# Patient Record
Sex: Female | Born: 1950
Health system: Southern US, Community
[De-identification: ages and names within clinical notes are randomized; demographics above are authoritative.]

## PROBLEM LIST (undated history)

## (undated) DIAGNOSIS — I10 Essential (primary) hypertension: Secondary | ICD-10-CM

## (undated) DIAGNOSIS — G4700 Insomnia, unspecified: Secondary | ICD-10-CM

## (undated) DIAGNOSIS — T7840XA Allergy, unspecified, initial encounter: Secondary | ICD-10-CM

## (undated) DIAGNOSIS — G4730 Sleep apnea, unspecified: Secondary | ICD-10-CM

## (undated) DIAGNOSIS — E119 Type 2 diabetes mellitus without complications: Secondary | ICD-10-CM

## (undated) DIAGNOSIS — F32A Depression, unspecified: Secondary | ICD-10-CM

## (undated) DIAGNOSIS — Z803 Family history of malignant neoplasm of breast: Secondary | ICD-10-CM

## (undated) DIAGNOSIS — F329 Major depressive disorder, single episode, unspecified: Secondary | ICD-10-CM

## (undated) DIAGNOSIS — F419 Anxiety disorder, unspecified: Secondary | ICD-10-CM

## (undated) DIAGNOSIS — K219 Gastro-esophageal reflux disease without esophagitis: Secondary | ICD-10-CM

## (undated) DIAGNOSIS — M1990 Unspecified osteoarthritis, unspecified site: Secondary | ICD-10-CM

## (undated) DIAGNOSIS — C189 Malignant neoplasm of colon, unspecified: Secondary | ICD-10-CM

## (undated) DIAGNOSIS — K5900 Constipation, unspecified: Secondary | ICD-10-CM

## (undated) DIAGNOSIS — G709 Myoneural disorder, unspecified: Secondary | ICD-10-CM

## (undated) DIAGNOSIS — Z9221 Personal history of antineoplastic chemotherapy: Secondary | ICD-10-CM

## (undated) DIAGNOSIS — Z923 Personal history of irradiation: Secondary | ICD-10-CM

## (undated) DIAGNOSIS — D649 Anemia, unspecified: Secondary | ICD-10-CM

## (undated) DIAGNOSIS — N289 Disorder of kidney and ureter, unspecified: Secondary | ICD-10-CM

## (undated) DIAGNOSIS — G629 Polyneuropathy, unspecified: Secondary | ICD-10-CM

## (undated) HISTORY — DX: Malignant neoplasm of colon, unspecified: C18.9

## (undated) HISTORY — PX: UPPER GASTROINTESTINAL ENDOSCOPY: SHX188

## (undated) HISTORY — DX: Family history of malignant neoplasm of breast: Z80.3

## (undated) HISTORY — DX: Anxiety disorder, unspecified: F41.9

## (undated) HISTORY — PX: COLON SURGERY: SHX602

## (undated) HISTORY — DX: Allergy, unspecified, initial encounter: T78.40XA

## (undated) HISTORY — PX: APPENDECTOMY: SHX54

## (undated) HISTORY — DX: Personal history of irradiation: Z92.3

## (undated) HISTORY — DX: Unspecified osteoarthritis, unspecified site: M19.90

## (undated) HISTORY — DX: Major depressive disorder, single episode, unspecified: F32.9

## (undated) HISTORY — DX: Personal history of antineoplastic chemotherapy: Z92.21

## (undated) HISTORY — DX: Constipation, unspecified: K59.00

## (undated) HISTORY — DX: Depression, unspecified: F32.A

## (undated) HISTORY — DX: Myoneural disorder, unspecified: G70.9

## (undated) HISTORY — PX: COLONOSCOPY: SHX174

## (undated) HISTORY — PX: HERNIA REPAIR: SHX51

---
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
## 1999-11-16 ENCOUNTER — Emergency Department (HOSPITAL_COMMUNITY): Admission: EM | Admit: 1999-11-16 | Discharge: 1999-11-16 | Payer: Self-pay | Admitting: Emergency Medicine

## 2005-03-31 ENCOUNTER — Emergency Department (HOSPITAL_COMMUNITY): Admission: EM | Admit: 2005-03-31 | Discharge: 2005-03-31 | Payer: Self-pay | Admitting: Emergency Medicine

## 2005-04-20 ENCOUNTER — Ambulatory Visit: Payer: Self-pay | Admitting: Family Medicine

## 2005-04-21 ENCOUNTER — Ambulatory Visit: Payer: Self-pay | Admitting: *Deleted

## 2005-04-26 ENCOUNTER — Ambulatory Visit (HOSPITAL_COMMUNITY): Admission: RE | Admit: 2005-04-26 | Discharge: 2005-04-26 | Payer: Self-pay | Admitting: Family Medicine

## 2005-05-04 ENCOUNTER — Ambulatory Visit: Payer: Self-pay | Admitting: Family Medicine

## 2005-05-04 ENCOUNTER — Encounter (INDEPENDENT_AMBULATORY_CARE_PROVIDER_SITE_OTHER): Payer: Self-pay | Admitting: Specialist

## 2005-06-15 ENCOUNTER — Ambulatory Visit: Payer: Self-pay | Admitting: Family Medicine

## 2005-07-28 ENCOUNTER — Ambulatory Visit: Payer: Self-pay | Admitting: Family Medicine

## 2005-09-19 ENCOUNTER — Ambulatory Visit: Payer: Self-pay | Admitting: Family Medicine

## 2005-09-22 ENCOUNTER — Ambulatory Visit: Payer: Self-pay | Admitting: Family Medicine

## 2005-09-22 ENCOUNTER — Encounter (INDEPENDENT_AMBULATORY_CARE_PROVIDER_SITE_OTHER): Payer: Self-pay | Admitting: *Deleted

## 2006-01-09 ENCOUNTER — Ambulatory Visit: Payer: Self-pay | Admitting: Family Medicine

## 2006-03-01 ENCOUNTER — Encounter (INDEPENDENT_AMBULATORY_CARE_PROVIDER_SITE_OTHER): Payer: Self-pay | Admitting: Family Medicine

## 2006-03-01 ENCOUNTER — Ambulatory Visit: Payer: Self-pay | Admitting: Family Medicine

## 2006-03-01 ENCOUNTER — Encounter (INDEPENDENT_AMBULATORY_CARE_PROVIDER_SITE_OTHER): Payer: Self-pay | Admitting: *Deleted

## 2006-05-15 ENCOUNTER — Ambulatory Visit (HOSPITAL_COMMUNITY): Admission: RE | Admit: 2006-05-15 | Discharge: 2006-05-15 | Payer: Self-pay | Admitting: Family Medicine

## 2006-08-15 ENCOUNTER — Ambulatory Visit: Payer: Self-pay | Admitting: Internal Medicine

## 2006-10-18 ENCOUNTER — Encounter (INDEPENDENT_AMBULATORY_CARE_PROVIDER_SITE_OTHER): Payer: Self-pay | Admitting: *Deleted

## 2006-11-08 ENCOUNTER — Encounter (INDEPENDENT_AMBULATORY_CARE_PROVIDER_SITE_OTHER): Payer: Self-pay | Admitting: Family Medicine

## 2006-11-08 DIAGNOSIS — E669 Obesity, unspecified: Secondary | ICD-10-CM

## 2006-11-08 DIAGNOSIS — F1910 Other psychoactive substance abuse, uncomplicated: Secondary | ICD-10-CM | POA: Insufficient documentation

## 2006-11-08 DIAGNOSIS — E119 Type 2 diabetes mellitus without complications: Secondary | ICD-10-CM

## 2006-11-08 DIAGNOSIS — I10 Essential (primary) hypertension: Secondary | ICD-10-CM | POA: Insufficient documentation

## 2006-11-08 DIAGNOSIS — Z87891 Personal history of nicotine dependence: Secondary | ICD-10-CM

## 2006-11-08 DIAGNOSIS — D259 Leiomyoma of uterus, unspecified: Secondary | ICD-10-CM | POA: Insufficient documentation

## 2006-11-10 ENCOUNTER — Encounter (INDEPENDENT_AMBULATORY_CARE_PROVIDER_SITE_OTHER): Payer: Self-pay | Admitting: Family Medicine

## 2006-12-02 ENCOUNTER — Encounter (INDEPENDENT_AMBULATORY_CARE_PROVIDER_SITE_OTHER): Payer: Self-pay | Admitting: Family Medicine

## 2006-12-02 LAB — CONVERTED CEMR LAB

## 2008-07-23 ENCOUNTER — Ambulatory Visit: Payer: Self-pay | Admitting: Internal Medicine

## 2008-07-23 ENCOUNTER — Encounter (INDEPENDENT_AMBULATORY_CARE_PROVIDER_SITE_OTHER): Payer: Self-pay | Admitting: Family Medicine

## 2008-07-23 DIAGNOSIS — K5909 Other constipation: Secondary | ICD-10-CM

## 2008-07-23 DIAGNOSIS — K648 Other hemorrhoids: Secondary | ICD-10-CM | POA: Insufficient documentation

## 2008-07-23 LAB — CONVERTED CEMR LAB: Blood Glucose, Fingerstick: 119

## 2008-08-11 DIAGNOSIS — N259 Disorder resulting from impaired renal tubular function, unspecified: Secondary | ICD-10-CM | POA: Insufficient documentation

## 2008-08-11 LAB — CONVERTED CEMR LAB
ALT: 9 units/L (ref 0–35)
AST: 14 units/L (ref 0–37)
Albumin: 4.2 g/dL (ref 3.5–5.2)
Alkaline Phosphatase: 94 units/L (ref 39–117)
BUN: 22 mg/dL (ref 6–23)
Calcium: 9.7 mg/dL (ref 8.4–10.5)
Cholesterol: 168 mg/dL (ref 0–200)
LDL Cholesterol: 81 mg/dL (ref 0–99)
Potassium: 4.1 meq/L (ref 3.5–5.3)
Sodium: 145 meq/L (ref 135–145)
Total Bilirubin: 0.2 mg/dL — ABNORMAL LOW (ref 0.3–1.2)
Total CHOL/HDL Ratio: 3.2

## 2008-08-27 ENCOUNTER — Ambulatory Visit: Payer: Self-pay | Admitting: Internal Medicine

## 2008-09-05 ENCOUNTER — Inpatient Hospital Stay (HOSPITAL_COMMUNITY): Admission: EM | Admit: 2008-09-05 | Discharge: 2008-09-08 | Payer: Self-pay | Admitting: Emergency Medicine

## 2008-09-05 ENCOUNTER — Encounter (INDEPENDENT_AMBULATORY_CARE_PROVIDER_SITE_OTHER): Payer: Self-pay | Admitting: Surgery

## 2008-09-05 DIAGNOSIS — K449 Diaphragmatic hernia without obstruction or gangrene: Secondary | ICD-10-CM | POA: Insufficient documentation

## 2008-09-05 DIAGNOSIS — Z8719 Personal history of other diseases of the digestive system: Secondary | ICD-10-CM

## 2008-09-09 ENCOUNTER — Telehealth (INDEPENDENT_AMBULATORY_CARE_PROVIDER_SITE_OTHER): Payer: Self-pay | Admitting: Nurse Practitioner

## 2008-09-25 ENCOUNTER — Encounter (INDEPENDENT_AMBULATORY_CARE_PROVIDER_SITE_OTHER): Payer: Self-pay | Admitting: *Deleted

## 2008-10-20 ENCOUNTER — Encounter (INDEPENDENT_AMBULATORY_CARE_PROVIDER_SITE_OTHER): Payer: Self-pay | Admitting: Internal Medicine

## 2008-10-20 ENCOUNTER — Ambulatory Visit: Payer: Self-pay | Admitting: Physician Assistant

## 2008-11-06 LAB — CONVERTED CEMR LAB
Cholesterol: 145 mg/dL (ref 0–200)
VLDL: 15 mg/dL (ref 0–40)

## 2009-04-24 ENCOUNTER — Encounter: Payer: Self-pay | Admitting: Physician Assistant

## 2009-07-28 ENCOUNTER — Ambulatory Visit: Payer: Self-pay | Admitting: Physician Assistant

## 2009-07-28 LAB — CONVERTED CEMR LAB
Bilirubin Urine: NEGATIVE
Blood Glucose, Fingerstick: 131
Blood in Urine, dipstick: NEGATIVE
Glucose, Urine, Semiquant: NEGATIVE
Nitrite: NEGATIVE
Protein, U semiquant: NEGATIVE
Urobilinogen, UA: 0.2
WBC Urine, dipstick: NEGATIVE

## 2009-07-30 LAB — CONVERTED CEMR LAB
AST: 13 units/L (ref 0–37)
Albumin: 4.3 g/dL (ref 3.5–5.2)
Alkaline Phosphatase: 92 units/L (ref 39–117)
BUN: 16 mg/dL (ref 6–23)
Basophils Relative: 0 % (ref 0–1)
CO2: 26 meq/L (ref 19–32)
Chloride: 106 meq/L (ref 96–112)
Creatinine, Ser: 1.05 mg/dL (ref 0.40–1.20)
Eosinophils Relative: 2 % (ref 0–5)
HCT: 42.5 % (ref 36.0–46.0)
Lymphocytes Relative: 27 % (ref 12–46)
Lymphs Abs: 1.9 10*3/uL (ref 0.7–4.0)
MCHC: 30.8 g/dL (ref 30.0–36.0)
Neutro Abs: 4.5 10*3/uL (ref 1.7–7.7)
Platelets: 285 10*3/uL (ref 150–400)
RDW: 14.9 % (ref 11.5–15.5)
TSH: 1.058 microintl units/mL (ref 0.350–4.500)
Total Bilirubin: 0.4 mg/dL (ref 0.3–1.2)
Total Protein: 7.2 g/dL (ref 6.0–8.3)
WBC: 7.1 10*3/uL (ref 4.0–10.5)

## 2009-08-11 ENCOUNTER — Ambulatory Visit: Payer: Self-pay | Admitting: Physician Assistant

## 2009-08-30 ENCOUNTER — Telehealth (INDEPENDENT_AMBULATORY_CARE_PROVIDER_SITE_OTHER): Payer: Self-pay | Admitting: *Deleted

## 2009-08-30 DIAGNOSIS — H269 Unspecified cataract: Secondary | ICD-10-CM

## 2009-09-01 ENCOUNTER — Ambulatory Visit: Payer: Self-pay | Admitting: Physician Assistant

## 2009-09-14 ENCOUNTER — Telehealth: Payer: Self-pay | Admitting: Physician Assistant

## 2009-10-01 ENCOUNTER — Ambulatory Visit: Payer: Self-pay | Admitting: Physician Assistant

## 2009-10-01 LAB — CONVERTED CEMR LAB
CO2: 25 meq/L (ref 19–32)
Calcium: 9.2 mg/dL (ref 8.4–10.5)
Creatinine, Ser: 0.95 mg/dL (ref 0.40–1.20)

## 2009-10-27 ENCOUNTER — Encounter: Payer: Self-pay | Admitting: Physician Assistant

## 2009-11-05 ENCOUNTER — Ambulatory Visit: Payer: Self-pay | Admitting: Physician Assistant

## 2009-11-05 DIAGNOSIS — F329 Major depressive disorder, single episode, unspecified: Secondary | ICD-10-CM | POA: Insufficient documentation

## 2009-11-05 DIAGNOSIS — N760 Acute vaginitis: Secondary | ICD-10-CM | POA: Insufficient documentation

## 2009-11-05 LAB — CONVERTED CEMR LAB
Bilirubin Urine: NEGATIVE
Blood in Urine, dipstick: NEGATIVE
KOH Prep: NEGATIVE
Ketones, urine, test strip: NEGATIVE
Specific Gravity, Urine: 1.025
Urobilinogen, UA: 0.2
WBC Urine, dipstick: NEGATIVE
Whiff Test: POSITIVE
pH: 5.5

## 2009-11-06 LAB — CONVERTED CEMR LAB: Chlamydia, DNA Probe: NEGATIVE

## 2009-11-11 ENCOUNTER — Encounter: Payer: Self-pay | Admitting: Physician Assistant

## 2009-12-16 ENCOUNTER — Ambulatory Visit: Payer: Self-pay | Admitting: Nurse Practitioner

## 2009-12-16 LAB — CONVERTED CEMR LAB
BUN: 14 mg/dL (ref 6–23)
CO2: 29 meq/L (ref 19–32)
Potassium: 3.7 meq/L (ref 3.5–5.3)

## 2009-12-17 ENCOUNTER — Encounter (INDEPENDENT_AMBULATORY_CARE_PROVIDER_SITE_OTHER): Payer: Self-pay | Admitting: Nurse Practitioner

## 2010-01-06 ENCOUNTER — Ambulatory Visit: Payer: Self-pay | Admitting: Nurse Practitioner

## 2010-03-02 NOTE — Assessment & Plan Note (Signed)
Summary: CPP///MC   Vital Signs:  Patient profile:   60 year old female Height:      62 inches Weight:      193.8 pounds BMI:     35.57 Temp:     98.5 degrees F oral Pulse rate:   80 / minute Pulse rhythm:   regular BP sitting:   172 / 98  (left arm)  Vitals Entered By: CMA Student Kenyatta Jeffreis CC: CPP Is Patient Diabetic? Yes Pain Assessment Patient in pain? no      CBG Result 101  Does patient need assistance? Functional Status Self care Ambulation Normal   Primary Care Provider:  Tereso Newcomer, PA-C  CC:  CPP.  History of Present Illness: Here for CPP. PHQ9=10.  Feels depressed.  No SI.  No h/o hosp for depression.  Took valium a long time ago.  Feels sad mainly.  No anxiety.  Open to seeing a counselor. Postmenopausal. No vaginal bleeding. Has slight discharge at times.  No odor or itching. Sexually active . . 2 separate partners. Up to date on mammo. + FHx of CA - mother Not taking calcium.  Problems Prior to Update: 1)  Vaginitis, Bacterial  (ICD-616.10) 2)  Depression  (ICD-311) 3)  Preventive Health Care  (ICD-V70.0) 4)  Cataracts  (ICD-366.9) 5)  Family History Diabetes 1st Degree Relative  (ICD-V18.0) 6)  Family History Breast Cancer 1st Degree Relative <50  (ICD-V16.3) 7)  Hiatal Hernia  (ICD-553.3) 8)  Appendicitis, Hx of  (ICD-V12.79) 9)  Renal Insufficiency  (ICD-588.9) 10)  Constipation, Chronic  (ICD-564.09) 11)  Internal Hemorrhoids Without Mention Comp  (ICD-455.0) 12)  Hx, Personal, Tobacco Use  (ICD-V15.82) 13)  Fibroids, Uterus  (ICD-218.9) 14)  Diabetes Mellitus, Type II  (ICD-250.00) 15)  Obesity  (ICD-278.00) 16)  Substance Abuse, Multiple  (ICD-305.90) 17)  Hypertension  (ICD-401.9)  Current Medications (verified): 1)  Norvasc 5 Mg Tabs (Amlodipine Besylate) .... Take 1 Tablet By Mouth Once A Day For Blood Pressure 2)  Lisinopril 10 Mg Tabs (Lisinopril) .... Take 1 Tablet By Mouth Once A Day For Blood Pressure  Allergies  (verified): No Known Drug Allergies  Past History:  Past Medical History: Last updated: 09/01/2009 Current Problems:  CATARACTS (ICD-366.9) FAMILY HISTORY DIABETES 1ST DEGREE RELATIVE (ICD-V18.0) FAMILY HISTORY BREAST CANCER 1ST DEGREE RELATIVE <50 (ICD-V16.3) HIATAL HERNIA (ICD-553.3) APPENDICITIS, HX OF (ICD-V12.79) RENAL INSUFFICIENCY (ICD-588.9) CONSTIPATION, CHRONIC (ICD-564.09) INTERNAL HEMORRHOIDS WITHOUT MENTION COMP (ICD-455.0) HX, PERSONAL, TOBACCO USE (ICD-V15.82) FIBROIDS, UTERUS (ICD-218.9) DIABETES MELLITUS, TYPE II (ICD-250.00) OBESITY (ICD-278.00) SUBSTANCE ABUSE, MULTIPLE (ICD-305.90) HYPERTENSION (ICD-401.9)  Past Surgical History: Last updated: 07/28/2009 laparoscopic appendectomy 09/05/2008 Tubal ligation  Family History: Reviewed history from 07/28/2009 and no changes required. Family History Breast cancer 1st degree relative <50 -  mom Family History Diabetes 1st degree relative Family History Hypertension no colon cancer  Social History: Reviewed history from 07/28/2009 and no changes required. 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 Eyes:  Denies blurring. CV:  Denies chest pain or discomfort and shortness of breath with exertion. Resp:  Denies cough. GI:  Denies bloody stools and dark tarry stools. GU:  Denies dysuria and hematuria. Neuro:  Denies headaches. Psych:  Denies suicidal thoughts/plans. Endo:  Denies cold intolerance and heat intolerance. Heme:  Denies abnormal bruising.  Physical Exam  General:  alert, well-developed, and well-nourished.   Head:  normocephalic and atraumatic.   Eyes:  pupils equal, pupils round, and pupils reactive to light.  no cataracts  observed by me Ears:  R ear normal and L ear normal.   Nose:  no external deformity.   Mouth:  pharynx pink and moist, no erythema, and no exudates.   Neck:  supple, no thyromegaly, no JVD, no carotid bruits, and no cervical lymphadenopathy.   Chest Wall:  no deformities.   Breasts:  skin/areolae normal, no masses, no abnormal thickening, no nipple discharge, no tenderness, and no adenopathy.   Lungs:  normal breath sounds, no crackles, and no wheezes.   Heart:  normal rate, regular rhythm, and no murmur.   Abdomen:  soft, non-tender, normal bowel sounds, and no hepatomegaly.   Rectal:  normal sphincter tone, no masses, and external hemorrhoid(s).   Genitalia:  whitish vaginal d/cnormal introitus, no external lesions, mucosa pink and moist, no vaginal or cervical lesions, no vaginal atrophy, no friaility or hemorrhage, and no adnexal masses or tenderness.  unable to palpate fundus due to body habitus Msk:  normal ROM.   Pulses:  R posterior tibial normal, R dorsalis pedis normal, L posterior tibial normal, and L dorsalis pedis normal.   Extremities:  no edema  Neurologic:  alert & oriented X3 and cranial nerves II-XII intact.   Skin:  turgor normal.   Psych:  normally interactive and good eye contact.     Impression & Recommendations:  Problem # 1:  PREVENTIVE HEALTH CARE (ICD-V70.0)  Orders: T-Pap Smear, Thin Prep (37106) T-Hemoccult Cards-Multiple (26948) Gastroenterology Referral (GI) UA Dipstick w/o Micro (manual) (54627) T-HIV Antibody  (Reflex) [id number]) T- GC Chlamydia (29937)  Problem # 2:  DIABETES MELLITUS, TYPE II (ICD-250.00)  The following medications were removed from the medication list:    Lisinopril 10 Mg Tabs (Lisinopril) .Marland Kitchen... Take 1 tablet by mouth once a day for blood pressure Her updated medication list for this problem includes:    Lisinopril 20 Mg Tabs (Lisinopril) .Marland Kitchen... Take 1 tablet by mouth once a day for blood pressure (dose  changed)  Orders: Capillary Blood Glucose/CBG (16967)  Problem # 3:  HYPERTENSION (ICD-401.9) increase lisinopril to 20 mg bp ck and bmet in 2 weeks  The following medications were removed from the medication list:    Lisinopril 10 Mg Tabs (Lisinopril) .Marland Kitchen... Take 1 tablet by mouth once a day for blood pressure Her updated medication list for this problem includes:    Norvasc 5 Mg Tabs (Amlodipine besylate) .Marland Kitchen... Take 1 tablet by mouth once a day for blood pressure    Lisinopril 20 Mg Tabs (Lisinopril) .Marland Kitchen... Take 1 tablet by mouth once a day for blood pressure (dose changed)  Orders: UA Dipstick w/o Micro (manual) (89381)  Problem # 4:  CATARACTS (ICD-366.9) saw opthalmology and was told everything is ok  Problem # 5:  DEPRESSION (ICD-311) start celexa f/u in one month refer to Marchelle Folks  Orders: Psychology Referral (Psychology)  Her updated medication list for this problem includes:    Citalopram Hydrobromide 20 Mg Tabs (Citalopram hydrobromide) .Marland Kitchen... Take 1 tablet by mouth once a day  Problem # 6:  VAGINITIS, BACTERIAL (ICD-616.10)  Her updated medication list for this problem includes:    Flagyl 500 Mg Tabs (Metronidazole) .Marland Kitchen... Take 1 tablet by mouth two times a day  Complete Medication List: 1)  Norvasc 5 Mg Tabs (Amlodipine besylate) .... Take 1 tablet by mouth once a day for blood pressure 2)  Citalopram Hydrobromide 20 Mg Tabs (Citalopram hydrobromide) .... Take 1 tablet by mouth once a day 3)  Lisinopril 20 Mg Tabs (Lisinopril) .... Take 1 tablet by mouth once a day for blood pressure (dose changed) 4)  Flagyl 500 Mg Tabs (Metronidazole) .... Take 1 tablet by mouth two times a day  Patient Instructions: 1)  Schedule your mammogram in March 2012. 2)  Schedule appt with Ethelene Browns.  3)  Increase Lisinopril to 20 mg once daily.  You can take 2 of the 10 mg tablets. 4)  A new prescription was sent to the [facility]. pharmacy. 5)  Return to the lab in 2 weeks for BP  check and BMET.  Dx 401.1.  Notify provider if BP > 140/90 or < 110/60. 6)  Schedule follow up with provider in 4 weeks for depression. 7)  I have sent a prescription to the [facility]. pharmacy for: 8)  Celexa (Citalopram) - take once daily. 9)  Flagyl (Metronidazole) take two times a day until all gone. Prescriptions: FLAGYL 500 MG TABS (METRONIDAZOLE) Take 1 tablet by mouth two times a day  #14 x 0   Entered and Authorized by:   Tereso Newcomer PA-C   Signed by:   Tereso Newcomer PA-C on 11/05/2009   Method used:   Faxed to ...       [facility] - Pharmac (retail)       [address] [place], [place]  [address]       Ph: [phone]       Fax: [phone]   RxID:   [id number] LISINOPRIL 20 MG TABS (LISINOPRIL) Take 1 tablet by mouth once a day for blood pressure (dose changed)  #30 x 5   Entered and Authorized by:   Tereso Newcomer PA-C   Signed by:   Tereso Newcomer PA-C on 11/05/2009   Method used:   Faxed to ...       [facility] - Pharmac (retail)       [address] [place], [place]  [address]       Ph: [phone]       Fax: [phone]   RxID:   [id number] CITALOPRAM HYDROBROMIDE 20 MG TABS (CITALOPRAM HYDROBROMIDE) Take 1 tablet by mouth once a day  #30 x 2   Entered and Authorized by:   Tereso Newcomer PA-C   Signed by:   Tereso Newcomer PA-C on 11/05/2009   Method used:   Faxed to ...       [facility] - Pharmac (retail)       [address] [place], [place]  [address]       Ph: [phone]       Fax: [phone]   RxID:   [id number]   Laboratory Results   Urine Tests    Routine Urinalysis   Color: yellow Glucose: negative   (Normal Range: Negative) Bilirubin: negative   (Normal Range: Negative) Ketone: negative   (Normal Range: Negative) Spec. Gravity: 1.025   (Normal Range: 1.003-1.035) Blood: negative   (Normal Range: Negative) pH: 5.5    (Normal Range: 5.0-8.0) Protein: trace   (Normal Range: Negative) Urobilinogen: 0.2   (Normal Range: 0-1) Nitrite: negative   (Normal  Range: Negative) Leukocyte Esterace: negative   (Normal Range: Negative)     Blood Tests     CBG Random:: 101mg /dL    [place] Source: vaginal WBC/hpf: 1-5 Bacteria/hpf: rare Clue cells/hpf: many  Positive whiff Yeast/hpf: none [place] KOH: Negative Trichomonas/hpf: none

## 2010-03-02 NOTE — Progress Notes (Signed)
Summary: opthamology referral   Phone Note Outgoing Call   Summary of Call: Refer to ophthalmology.  Retasure abnormal.  Looks like she has cataracts. Send to [facility] after patient notified.  Initial call taken by: Brynda Rim,  August 30, 2009 2:13 PM  Follow-up for Phone Call        pt is aware.... [facility] it is okay for you to send out the referral Follow-up by: Armenia Shannon,  August 31, 2009 11:02 AM  Additional Follow-up for Phone Call Additional follow up Details #1::        PT HAVE AN APPT 10-24-09 @ 10AM PT AWARE OF THE APPT . Additional Follow-up by: Cheryll Dessert,  September 10, 2009 10:55 AM  New Problems: CATARACTS (ICD-366.9)   New Problems: CATARACTS (ICD-366.9)    Impression & Recommendations:  Problem # 1:  CATARACTS (ICD-366.9)  refer to ophthalmology  Orders: Ophthalmology Referral (Ophthalmology)  Complete Medication List: 1)  Norvasc 5 Mg Tabs (Amlodipine besylate) .... Take 1 tablet by mouth once a day for blood pressure

## 2010-03-02 NOTE — Progress Notes (Signed)
Summary: OPHTHALMOLOGY  Phone Note Outgoing Call   Summary of Call: I  CANCELED HER APT FOR THE OPHTHALMOLOGY BECAUSE HE DON' T SPECIALIST IN DIABETIC EXAM  WE ARE LOOKING FOR ANOTHER RESOURCES . PT IS AWARE OF THE CANCELATION . Initial call taken by: Cheryll Dessert,  September 14, 2009 11:21 AM  Follow-up for Phone Call        ok. Tereso Newcomer PA-C  September 20, 2009 8:55 PM   Additional Follow-up for Phone Call Additional follow up Details #1::        PT HAVE AN APPT 10-24-09 @ [facility]  PH# [phone] ADDRESS [phone] PT AWARE OF THE APPT AND COPAY $60  Additional Follow-up by: Cheryll Dessert,  September 21, 2009 8:33 AM

## 2010-03-02 NOTE — Letter (Signed)
Summary: *HSN Results Follow up  [facility]  [address] [place], [place] [address]   Phone: [phone]  Fax: [phone]      12/17/2009   DEBRALEE BRAAKSMA [address] [place], [place]  [address]   Dear  Ms. Madisan Weisel,                            ____S.Drinkard,FNP   ____D. Gore,FNP       ____B. McPherson,MD   ____V. Rankins,MD    ____E. Mulberry,MD    __X__N. Daphine Deutscher, FNP  ____D. Reche Dixon, MD    ____K. Philipp Deputy, MD    ____Other     This letter is to inform you that your recent test(s):  _______Pap Smear    ___X____Lab Test     _______X-ray    ___X____ is within acceptable limits  _______ requires a medication change  _______ requires a follow-up lab visit  _______ requires a follow-up visit with your Bunny Lowdermilk   Comments:  Labs done during recent office visit were normal.       _________________________________________________________ If you have any questions, please contact our office [phone].                     Sincerely,    Lehman Prom FNP [facility]

## 2010-03-02 NOTE — Assessment & Plan Note (Signed)
Summary: FU VISIT WITH SCOTT IN 3-4 MONTHS FOR BP / COME FASTING//GK   Vital Signs:  Patient profile:   60 year old female Height:      62 inches Weight:      190 pounds BMI:     34.88 Temp:     97.9 degrees F oral Pulse rate:   65 / minute Pulse rhythm:   regular Resp:     18 per minute BP sitting:   173 / 84  (left arm) Cuff size:   large  Vitals Entered By: Armenia Shannon (September 01, 2009 10:18 AM) CC: f/u... , Hypertension Management Is Patient Diabetic? No Pain Assessment Patient in pain? no       Does patient need assistance? Functional Status Self care Ambulation Normal   Primary Care Provider:  Tereso Newcomer, PA-C  CC:  f/u...  and Hypertension Management.  History of Present Illness: Here for f/u on HTN.  Taking meds but no meds today.   Hypertension History:      She denies headache, chest pain, dyspnea with exertion, peripheral edema, and syncope.  She notes no problems with any antihypertensive medication side effects.  Further comments include: no meds today .        Positive major cardiovascular risk factors include female age 44 years old or older, diabetes, hypertension, and current tobacco user.     Current Medications (verified): 1)  Norvasc 5 Mg Tabs (Amlodipine Besylate) .... Take 1 Tablet By Mouth Once A Day For Blood Pressure  Allergies (verified): No Known Drug Allergies  Past History:  Past Medical History: Current Problems:  CATARACTS (ICD-366.9) FAMILY HISTORY DIABETES 1ST DEGREE RELATIVE (ICD-V18.0) FAMILY HISTORY BREAST CANCER 1ST DEGREE RELATIVE <50 (ICD-V16.3) HIATAL HERNIA (ICD-553.3) APPENDICITIS, HX OF (ICD-V12.79) RENAL INSUFFICIENCY (ICD-588.9) CONSTIPATION, CHRONIC (ICD-564.09) INTERNAL HEMORRHOIDS WITHOUT MENTION COMP (ICD-455.0) HX, PERSONAL, TOBACCO USE (ICD-V15.82) FIBROIDS, UTERUS (ICD-218.9) DIABETES MELLITUS, TYPE II (ICD-250.00) OBESITY (ICD-278.00) SUBSTANCE ABUSE, MULTIPLE (ICD-305.90) HYPERTENSION  (ICD-401.9)  Past Surgical History: Reviewed history from 07/28/2009 and no changes required. laparoscopic appendectomy 09/05/2008 Tubal ligation  Review of Systems General:  + hot flashes at night .  Physical Exam  General:  alert, well-developed, and well-nourished.   Head:  normocephalic and atraumatic.   Eyes:  pupils equal, pupils round, and pupils reactive to light.  no cataracts observed by me Neck:  supple.   Lungs:  normal breath sounds.   Heart:  normal rate and regular rhythm.   Abdomen:  soft and non-tender.   Neurologic:  alert & oriented X3 and cranial nerves II-XII intact.   Psych:  normally interactive.    Diabetes Management Exam:    Foot Exam (with socks and/or shoes not present):       Sensory-Monofilament:          Left foot: normal          Right foot: normal   Impression & Recommendations:  Problem # 1:  HYPERTENSION (ICD-401.9) Assessment Improved  eventhough she has not taken meds today, think she needs Lisinopril added back start Lisinopril 10 mg once daily repeat bmet and BP check in 2 weeks  Her updated medication list for this problem includes:    Norvasc 5 Mg Tabs (Amlodipine besylate) .Marland Kitchen... Take 1 tablet by mouth once a day for blood pressure    Lisinopril 10 Mg Tabs (Lisinopril) .Marland Kitchen... Take 1 tablet by mouth once a day for blood pressure  Orders: EKG w/ Interpretation (93000)  Problem # 2:  CATARACTS (  ICD-366.9) ophthalmo appt pending  Problem # 3:  DIABETES MELLITUS, TYPE II (ICD-250.00)  A1C optimal at last check without meds repeat at CPP  Her updated medication list for this problem includes:    Lisinopril 10 Mg Tabs (Lisinopril) .Marland Kitchen... Take 1 tablet by mouth once a day for blood pressure  Orders: Capillary Blood Glucose/CBG ([address])  Problem # 4:  Preventive Health Care (ICD-V70.0) mammo results requested CPP scheduled in 10/2009 can discuss +/- clonidine for hot flashes at CPP  Complete Medication List: 1)  Norvasc 5 Mg  Tabs (Amlodipine besylate) .... Take 1 tablet by mouth once a day for blood pressure 2)  Lisinopril 10 Mg Tabs (Lisinopril) .... Take 1 tablet by mouth once a day for blood pressure  Hypertension Assessment/Plan:      The patient's hypertensive risk group is category C: Target organ damage and/or diabetes.  Her calculated 10 year risk of coronary heart disease is 27 %.  Today's blood pressure is 173/84.  Her blood pressure goal is < 130/80.  Patient Instructions: 1)  Cancel lab appointment in October. 2)  Start taking Lisinopril once daily for blood pressure along with your norvasc. 3)  Follow up with the nurse in 2 weeks for BMET and BP check.  Make sure you take your medicines that day. (Dx 401.1) 4)  Call me is you do not hear anything about an eye doctor appointment in the next 2-3 weeks. 5)  Follow up with me in September for your CPP.  Come fasting so we can check your cholesterol. Prescriptions: LISINOPRIL 10 MG TABS (LISINOPRIL) Take 1 tablet by mouth once a day for blood pressure  #30 x 5   Entered and Authorized by:   Tereso Newcomer PA-C   Signed by:   Tereso Newcomer PA-C on 09/01/2009   Method used:   Print then Give to Patient   RxID:   [id number]   Last LDL:                                                 75 (10/20/2008 9:16:00 PM)        Diabetic Foot Exam Last Podiatry Exam Date: 09/01/2009  Foot Inspection Is there a history of a foot ulcer?              No Is there a foot ulcer now?              No Can the patient see the bottom of their feet?          Yes Are the shoes appropriate in style and fit?          Yes Is there swelling or an abnormal foot shape?          No Are the toenails long?                No Are the toenails thick?                No Are the toenails ingrown?              No Is there heavy callous build-up?              No Is there a claw toe deformity?  No Is there elevated skin temperature?            No Is there  limited ankle dorsiflexion?            No Is there foot or ankle muscle weakness?            No Do you have pain in calf while walking?           No      Diabetic Foot Care Education :Patient educated on appropriate care of diabetic feet.     10-g (5.07) Semmes-Weinstein Monofilament Test Performed by: Armenia Shannon          Right Foot          Left Foot Visual Inspection               Test Control      normal         normal Site 1         normal         normal Site 2         normal         normal Site 3         normal         normal Site 4         normal         normal Site 5         normal         normal Site 6         normal         normal Site 7         normal         normal Site 8         normal         normal Site 9         normal         normal Site 10         normal         normal  Impression      normal         normal   EKG  Procedure date:  09/01/2009  Findings:      Normal sinus rhythm with rate of:  64 leftward axis IVCD nonspecific ST TW changes LVH   Mammogram  Procedure date:  04/24/2009  Findings:      No specific mammographic evidence of malignancy.  Assessment: BIRADS 1. Location: [facility].    Comments:      Screening mammogram in 1 year.

## 2010-03-02 NOTE — Letter (Signed)
Summary: *HSN Results Follow up  [facility]  [address] [place], [place] [address]   Phone: [phone]  Fax: [phone]      10/27/2009   Gail Carlson [address] [place], [place]  [address]   Dear  Ms. [name],                            ____S.Drinkard,FNP   ____D. Gore,FNP       ____B. McPherson,MD   ____V. Rankins,MD    ____E. Mulberry,MD    ____N. Daphine Deutscher, FNP  ____D. Reche Dixon, MD    ____K. Philipp Deputy, MD    __x__S. Alben Spittle, PA-C    This letter is to inform you that your recent test(s):  _______Pap Smear    ___x____Lab Test     _______X-ray    ___x____ is within acceptable limits  _______ requires a medication change  _______ requires a follow-up lab visit  _______ requires a follow-up visit with your provider   Comments:       _________________________________________________________ If you have any questions, please contact our office                     Sincerely,  Tereso Newcomer PA-C [facility]

## 2010-03-02 NOTE — Assessment & Plan Note (Signed)
Summary: HTN   Vital Signs:  Patient profile:   60 year old female Weight:      192.0 pounds BMI:     35.24 Temp:     98.1 degrees F oral Pulse rate:   76 / minute Pulse rhythm:   regular Resp:     20 per minute BP sitting:   166 / 112  (left arm) Cuff size:   large  Vitals Entered By: Levon Hedger (December 16, 2009 12:38 PM)  Nutrition Counseling: Patient's BMI is greater than 25 and therefore counseled on weight management options. CC: follow-up visit depression, BP, Hypertension Management, Depression Is Patient Diabetic? No Pain Assessment Patient in pain? no      CBG Result 99 CBG Device ID B  Does patient need assistance? Functional Status Self care Ambulation Normal   Primary Care Provider:  Tereso Newcomer, PA-C  CC:  follow-up visit depression, BP, Hypertension Management, and Depression.  History of Present Illness:  Pt into the office for f/u on htn   Depression History:      The patient denies fatigue (loss of energy) and recurrent thoughts of death or suicide.        The patient denies that she feels like life is not worth living, denies that she wishes that she were dead, and denies that she has thought about ending her life.  Due to her current symptoms, it often takes extra effort to do the things she needs to do.         Depression Treatment History:  Prior Medication Used:   Start Date: Assessment of Effect:   Comments:  Celexa (citalopram)     11/05/2009   some improvement     --  Hypertension History:      She denies headache, chest pain, and palpitations.  She notes no problems with any antihypertensive medication side effects.  Pt has not taking amlodipine in the past 3 days.        Positive major cardiovascular risk factors include female age 52 years old or older, diabetes, hypertension, and current tobacco user.      Habits & Providers  Alcohol-Tobacco-Diet     Alcohol drinks/day: 2     Alcohol type: all     >5/day in last 3 mos:  yes     Tobacco Status: current     Cigarette Packs/Day: 0.5  Exercise-Depression-Behavior     Does Patient Exercise: no     Have you felt down or hopeless? yes     Have you felt little pleasure in things? yes     Depression Counseling: further diagnostic testing and/or other treatment is indicated     Drug Use: yes  Comments: Pt was advised to go to [facility] - but she has not been yet  Medications Prior to Update: 1)  Norvasc 5 Mg Tabs (Amlodipine Besylate) .... Take 1 Tablet By Mouth Once A Day For Blood Pressure 2)  Citalopram Hydrobromide 20 Mg Tabs (Citalopram Hydrobromide) .... Take 1 Tablet By Mouth Once A Day 3)  Lisinopril 20 Mg Tabs (Lisinopril) .... Take 1 Tablet By Mouth Once A Day For Blood Pressure (Dose Changed)  Current Medications (verified): 1)  Norvasc 5 Mg Tabs (Amlodipine Besylate) .... Take 1 Tablet By Mouth Once A Day For Blood Pressure 2)  Citalopram Hydrobromide 20 Mg Tabs (Citalopram Hydrobromide) .... Take 1 Tablet By Mouth Once A Day 3)  Lisinopril 20 Mg Tabs (Lisinopril) .... Take 1 Tablet By Mouth Once A  Day For Blood Pressure (Dose Changed)  Allergies (verified): No Known Drug Allergies  Review of Systems General:  Denies fever. CV:  Denies chest pain or discomfort. Resp:  Denies cough. GI:  Denies abdominal pain, nausea, and vomiting.  Physical Exam  General:  alert.   Head:  normocephalic.   Lungs:  normal breath sounds.   Heart:  normal rate and regular rhythm.   Abdomen:  normal bowel sounds.   Msk:  normal ROM.   Neurologic:  alert & oriented X3.     Impression & Recommendations:  Problem # 1:  DIABETES MELLITUS, TYPE II (ICD-250.00) stable Her updated medication list for this problem includes:    Lisinopril 20 Mg Tabs (Lisinopril) .Marland Kitchen... Take 1 tablet by mouth once a day for blood pressure (dose changed)  Orders: Capillary Blood Glucose/CBG (11914)  Problem # 2:  HYPERTENSION (ICD-401.9) BP is elevated rx sent to the  pharmacy  Her updated medication list for this problem includes:    Norvasc 5 Mg Tabs (Amlodipine besylate) .Marland Kitchen... Take 1 tablet by mouth once a day for blood pressure    Lisinopril 20 Mg Tabs (Lisinopril) .Marland Kitchen... Take 1 tablet by mouth once a day for blood pressure (dose changed)  Orders: T-Basic Metabolic Panel [id number])  Problem # 3:  OBESITY (ICD-278.00) advise pt to monitor diet and exercise  Problem # 4:  DEPRESSION (ICD-311) pt is doing well on meds She has not made an appt with [facility] Her updated medication list for this problem includes:    Citalopram Hydrobromide 20 Mg Tabs (Citalopram hydrobromide) .Marland Kitchen... Take 1 tablet by mouth once a day  Complete Medication List: 1)  Norvasc 5 Mg Tabs (Amlodipine besylate) .... Take 1 tablet by mouth once a day for blood pressure 2)  Citalopram Hydrobromide 20 Mg Tabs (Citalopram hydrobromide) .... Take 1 tablet by mouth once a day 3)  Lisinopril 20 Mg Tabs (Lisinopril) .... Take 1 tablet by mouth once a day for blood pressure (dose changed)  Hypertension Assessment/Plan:      The patient's hypertensive risk group is category C: Target organ damage and/or diabetes.  Her calculated 10 year risk of coronary heart disease is 27 %.  Today's blood pressure is 166/112.  Her blood pressure goal is < 130/80.  Patient Instructions: 1)  Schedule nurse visit in 4 weeks for blood pressure check.  2)  Take medications before this visit. 3)  Goal <140/90 Prescriptions: NORVASC 5 MG TABS (AMLODIPINE BESYLATE) Take 1 tablet by mouth once a day for blood pressure  #30 x 5   Entered and Authorized by:   Lehman Prom FNP   Signed by:   Lehman Prom FNP on 12/16/2009   Method used:   Faxed to ...       [facility] (retail)       [address] [place], [place]  [address]       Ph: [phone]       Fax: [phone]   RxID:   [id number]    Orders Added: 1)  Capillary Blood  Glucose/CBG [82948] 2)  Est. Patient Level III [63875] 3)  T-Basic Metabolic Panel [phone]

## 2010-03-02 NOTE — Progress Notes (Signed)
Summary: Office Visit//DEPRESSION SCREENING  Office Visit//DEPRESSION SCREENING   Imported By: Arta Bruce 11/09/2009 11:18:56  _____________________________________________________________________  External Attachment:    Type:   Image     Comment:   External Document

## 2010-03-02 NOTE — Letter (Signed)
Summary: [name]   Imported By: Arta Bruce 10/01/2009 15:06:26  _____________________________________________________________________  External Attachment:    Type:   Image     Comment:   External Document

## 2010-03-02 NOTE — Assessment & Plan Note (Signed)
Summary: rankins pt/estab/htn///kt   Vital Signs:  Patient profile:   60 year old female Height:      62 inches Weight:      193 pounds BMI:     35.43 Temp:     98.6 degrees F oral Pulse rate:   88 / minute Pulse rhythm:   regular Resp:     18 per minute BP sitting:   160 / 100  (left arm) Cuff size:   large  Vitals Entered By: [place] Shannon (July 28, 2009 8:22 AM) CC: establish with provider... pt needs refills on med.... pt says she has a head cold and she is congested... Is Patient Diabetic? Yes Pain Assessment Patient in pain? no      CBG Result 131  Does patient need assistance? Functional Status Self care Ambulation Normal   Primary Care Provider:  Tereso Newcomer, PA-C  CC:  establish with provider... pt needs refills on med.... pt says she has a head cold and she is congested....  History of Present Illness: Here for f/u.  Previously followed by Dr. Barbaraann Barthel.  No meds in a couple months.  Says she was taking Norvasc 5 and Metformin 500 two times a day.  Lotensin and Lotensin/HCT listed in her med list.  Has a h/o renal insuff.  Has had URI symptoms last few days.  She has taken some sudafed, but has only been taking benadryl the last few days.  Feels much better.  No fevers.  Slight cough with clear sputum.  No dyspnea.  No chest pain.  No syncope.  No headaches.    DM:  Checks sugars at home and usually under 200.    Current Medications (verified): 1)  Glucophage 500 Mg  Tabs (Metformin Hcl) 2)  Lotensin Hct 10-12.5 Mg  Tabs (Benazepril-Hydrochlorothiazide) 3)  Norvasc 10 Mg  Tabs (Amlodipine Besylate) 4)  Klonopin 0.5 Mg  Tabs (Clonazepam) 5)  Metformin Hcl 500 Mg  Tb24 (Metformin Hcl) 6)  Lotensin 10 Mg Tabs (Benazepril Hcl) .... One Daily 7)  Anusol-Hc 25 Mg Supp (Hydrocortisone Acetate) .... One Rectally Ever Every 12 Hours For Hemorrhoids 8)  Pepcid 20 Mg Tabs (Famotidine) .... One Every 12 Hours For Stomach 9)  Colace 100 Mg Caps (Docusate Sodium) .... One  or Two At Bedtime For Constipation. 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   Head:  normocephalic and atraumatic.   Eyes:  pupils equal, pupils round, and pupils reactive to light.   fundi difficult to visualize bilat Neck:  supple, no thyromegaly, no carotid bruits, and no cervical lymphadenopathy.   Lungs:  normal breath sounds.   Heart:  normal rate and regular rhythm.   Abdomen:  soft, non-tender, and no hepatomegaly.   Msk:  no foot callus or ulcer bilat Pulses:  R posterior tibial normal, R dorsalis pedis normal, L posterior tibial normal, and L dorsalis pedis normal.   Extremities:  no edema  Neurologic:  alert & oriented X3 and cranial nerves II-XII intact.   Psych:  normally interactive.     Impression & Recommendations:  Problem # 1:  SUBSTANCE ABUSE, MULTIPLE (ICD-305.90) rec. referral to substance abuse counselor but she declines  Problem # 2:  HYPERTENSION (ICD-401.9) Assessment: Deteriorated  off meds restart norvasc check labs decide on whether or not to restart ACE repeat BP check in 2 weeks  The following medications were removed from the medication list:    Lotensin Hct 10-12.5 Mg Tabs (Benazepril-hydrochlorothiazide)    Lotensin 10 Mg Tabs (Benazepril hcl) ..... One daily Her updated medication list for this problem includes:    Norvasc 5 Mg  Tabs (Amlodipine besylate) .Marland Kitchen... Take 1 tablet by mouth once a day for blood pressure  Orders: T-Comprehensive Metabolic Panel ([id number]) T-CBC w/Diff ([id number]) T-TSH (13086-57846) T-Urinalysis (96295-28413) T-Urine Microalbumin w/creat. ratio [phone])  Problem # 3:  DIABETES MELLITUS, TYPE II (ICD-250.00)  was taking metformin . . .out of 2 months check A1C has a h/o renal insuff. . . not sure if she should be on metformin check bmet  decide on whether or not to continue metformin after eval renal fxn and A1C  The following medications were removed from the medication list:    Glucophage 500 Mg Tabs (Metformin hcl)    Lotensin Hct 10-12.5 Mg Tabs (Benazepril-hydrochlorothiazide)    Metformin Hcl 500 Mg Tb24 (Metformin hcl)    Lotensin 10 Mg Tabs (Benazepril hcl) ..... One daily  Orders: Capillary Blood Glucose/CBG (40347) T-Comprehensive Metabolic Panel [phone]) T-CBC w/Diff (64332-95188) T-TSH (41660-63016) T-Urinalysis (01093-23557) T-Urine Microalbumin w/creat. ratio [id number]) T- Hemoglobin A1C (62831-51761)  Problem # 4:  RENAL INSUFFICIENCY (ICD-588.9)  check labs  Orders: T-Comprehensive Metabolic Panel (60737-10626) T-Urine Microalbumin w/creat. ratio [id number])  Problem # 5:  Preventive Health Care (ICD-V70.0) states she has had mammo needs CPP will arrange after f/u for blood pressure  Complete Medication List: 1)  Norvasc 5 Mg Tabs (Amlodipine besylate) .... Take 1 tablet by mouth once a day for blood pressure  Patient Instructions: 1)  Need record of mammogram done in last 3 months. 2)  Schedule retasure at [facility]. 3)  Schedule follow up with Scott in 3-4 weeks for blood pressure.  Come fasting so we can check your cholesterol (nothing to eat or drink after midnight except water). 4)  Schedule follow up for CPP with Scott in 3 months. 5)  Tobacco is very bad for your health and your loved ones ! You  should stop smoking !  6)  Stop smoking tips: Choose a quit date. Cut down before the quit date. Decide what you will do as a substitute when you feel the urge to smoke(gum, toothpick, exercise).  Prescriptions: NORVASC 5 MG TABS (AMLODIPINE BESYLATE) Take 1 tablet by mouth once a day for blood pressure  #30 x 3   Entered and Authorized by:   Tereso Newcomer PA-C   Signed by:   Tereso Newcomer PA-C on 07/28/2009   Method used:   Printed then faxed to ...       [facility] (retail)       [address] [place], [place]  [address]       Ph: [phone]       Fax: [phone]   RxID:   [id number]   Laboratory Results   Urine Tests    Routine Urinalysis   Glucose: negative   (Normal Range:  Negative) Bilirubin: negative   (Normal Range: Negative) Ketone: negative   (Normal Range: Negative) Spec. Gravity: >=1.030   (Normal Range: 1.003-1.035) Blood: negative   (Normal Range: Negative) pH: 5.5   (Normal Range: 5.0-8.0) Protein: negative   (Normal Range: Negative) Urobilinogen: 0.2   (Normal Range: 0-1) Nitrite: negative   (Normal Range: Negative) Leukocyte Esterace: negative   (Normal Range: Negative)     Blood Tests     CBG Random:: 131mg /dL

## 2010-03-02 NOTE — Letter (Signed)
Summary: *HSN Results Follow up  [facility]  [address] [place], [place] [address]   Phone: [phone]  Fax: [phone]      11/11/2009   Gail Carlson [address] [place], [place]  [address]   Dear  [name],                            ____S.Drinkard,FNP   ____D. Gore,FNP       ____B. McPherson,MD   ____V. Rankins,MD    ____E. Mulberry,MD    ____N. Daphine Deutscher, FNP  ____D. Reche Dixon, MD    ____K. Philipp Deputy, MD    __x_S. Alben Spittle, PA-C     This letter is to inform you that your recent test(s):  __x_____Pap Smear    _______Lab Test     _______X-ray    ___x____ is normal  _______ requires a medication change  _______ requires a follow-up lab visit  _______ requires a follow-up visit with your provider   Comments:       _________________________________________________________ If you have any questions, please contact our office                     Sincerely,  Tereso Newcomer PA-C [facility]

## 2010-04-09 ENCOUNTER — Encounter (INDEPENDENT_AMBULATORY_CARE_PROVIDER_SITE_OTHER): Payer: Self-pay | Admitting: Nurse Practitioner

## 2010-04-09 ENCOUNTER — Encounter: Payer: Self-pay | Admitting: Nurse Practitioner

## 2010-04-13 NOTE — Assessment & Plan Note (Signed)
Summary: HTN   Vital Signs:  Patient profile:   60 year old female Weight:      186.5 pounds BMI:     34.23 Temp:     98.1 degrees F oral Pulse rate:   71 / minute Pulse rhythm:   regular Resp:     16 per minute BP sitting:   186 / 123  (left arm) Cuff size:   large  Vitals Entered By: Levon Hedger (April 09, 2010 11:47 AM)  Nutrition Counseling: Patient's BMI is greater than 25 and therefore counseled on weight management options. CC: check up...hemrrhoids came down but have gone back up....medication refills, Hypertension Management, Depression Is Patient Diabetic? No Pain Assessment Patient in pain? no       Does patient need assistance? Functional Status Self care Ambulation Normal   Primary Care Provider:  Tereso Newcomer, PA-C  CC:  check up...hemrrhoids came down but have gone back up....medication refills, Hypertension Management, and Depression.  History of Present Illness: Pt into the office for follow up   Obesity - down 6 pounds since last visit  Depression History:      The patient denies recurrent thoughts of death or suicide.        The patient denies that she feels like life is not worth living, denies that she wishes that she were dead, and denies that she has thought about ending her life.        Comments:  pt has not been taking celexa.  Depression Treatment History:  Prior Medication Used:   Start Date: Assessment of Effect:   Comments:  Celexa (citalopram)     11/05/2009   some improvement     --  Diabetes Management History:      The patient is a 60 years old female who comes in for evaluation of DM Type 2.  She has not been enrolled in the "Diabetic Education Program".  She states understanding of dietary principles but she is not following the appropriate diet.  No sensory loss is reported.  Self foot exams are not being performed.  She is not checking home blood sugars.  She says that she is not exercising regularly.        Hypoglycemic  symptoms are not occurring.  No hyperglycemic symptoms are reported.        No changes have been made to her treatment plan since last visit.    Hypertension History:      She denies headache, chest pain, and palpitations.  She notes no problems with any antihypertensive medication side effects.  pt has not taken her BP medications since 12/2010.        Positive major cardiovascular risk factors include female age 12 years old or older, diabetes, hypertension, and current tobacco user.        Further assessment for target organ damage reveals no history of ASHD, cardiac end-organ damage (CHF/LVH), stroke/TIA, peripheral vascular disease, renal insufficiency, or hypertensive retinopathy.      Habits & Providers  Alcohol-Tobacco-Diet     Alcohol drinks/day: 2     Alcohol type: all     >5/day in last 3 mos: yes     Tobacco Status: current     Tobacco Counseling: to quit use of tobacco products     Cigarette Packs/Day: 0.5  Exercise-Depression-Behavior     Does Patient Exercise: no     Depression Counseling: further diagnostic testing and/or other treatment is indicated     Drug Use: yes  Allergies (verified): No Known Drug Allergies  Review of Systems CV:  Denies chest pain or discomfort. Resp:  Denies cough. GI:  Complains of constipation and hemorrhoids; denies abdominal pain, nausea, and vomiting; no problems with hemorrhoids in ovier 31 years but when she made appt hemorrhoids were flared.  she did some hot water baths which helped with symptoms.  No otc meds.  Physical Exam  General:  alert.   Head:  normocephalic.   Ears:  ear piercing(s) noted.   Lungs:  normal breath sounds.   Heart:  normal rate and regular rhythm.   Abdomen:  normal bowel sounds.   Msk:  normal ROM.   Neurologic:  alert & oriented X3.   Skin:  color normal.   Psych:  Oriented X3.     Impression & Recommendations:  Problem # 1:  HYPERTENSION (ICD-401.9) advised pt to restart on meds Her  updated medication list for this problem includes:    Norvasc 5 Mg Tabs (Amlodipine besylate) .Marland Kitchen... Take 1 tablet by mouth once a day for blood pressure    Lisinopril 20 Mg Tabs (Lisinopril) .Marland Kitchen... Take 1 tablet by mouth once a day for blood pressure (dose changed)  Problem # 2:  OBESITY (ICD-278.00) down 6 pounds since the last visit  Problem # 3:  DIABETES MELLITUS, TYPE II (ICD-250.00) No current meds - diet controlled will check on next nurse visit Her updated medication list for this problem includes:    Lisinopril 20 Mg Tabs (Lisinopril) .Marland Kitchen... Take 1 tablet by mouth once a day for blood pressure (dose changed)  Complete Medication List: 1)  Norvasc 5 Mg Tabs (Amlodipine besylate) .... Take 1 tablet by mouth once a day for blood pressure 2)  Citalopram Hydrobromide 20 Mg Tabs (Citalopram hydrobromide) .... Take 1 tablet by mouth once a day 3)  Lisinopril 20 Mg Tabs (Lisinopril) .... Take 1 tablet by mouth once a day for blood pressure (dose changed)  Diabetes Management Assessment/Plan:      Her blood pressure goal is < 130/80.    Hypertension Assessment/Plan:      The patient's hypertensive risk group is category C: Target organ damage and/or diabetes.  Her calculated 10 year risk of coronary heart disease is 27 %.  Today's blood pressure is 186/123.  Her blood pressure goal is < 130/80.  Patient Instructions: 1)  Your blood pressure is very high today - 186/123 2)  Your medications will be sent to the pharmacy. Be sure to pick them up on next week and take your medication daily. 3)  Mood - restart your mood medications. 4)  Follow up here in 2 weeks for a blood pressure check 5)  will also need cbg and hbga1c. 6)  Follow up in 6 weeks with n.martin,fnp for ovary.   Orders Added: 1)  Est. Patient Level III [81191]

## 2010-05-09 LAB — GLUCOSE, CAPILLARY
Glucose-Capillary: 104 mg/dL — ABNORMAL HIGH (ref 70–99)
Glucose-Capillary: 106 mg/dL — ABNORMAL HIGH (ref 70–99)
Glucose-Capillary: 117 mg/dL — ABNORMAL HIGH (ref 70–99)
Glucose-Capillary: 137 mg/dL — ABNORMAL HIGH (ref 70–99)
Glucose-Capillary: 143 mg/dL — ABNORMAL HIGH (ref 70–99)
Glucose-Capillary: 148 mg/dL — ABNORMAL HIGH (ref 70–99)

## 2010-05-09 LAB — DIFFERENTIAL
Lymphocytes Relative: 12 % (ref 12–46)
Lymphs Abs: 1.5 10*3/uL (ref 0.7–4.0)
Monocytes Absolute: 0.8 10*3/uL (ref 0.1–1.0)

## 2010-05-09 LAB — BASIC METABOLIC PANEL
BUN: 6 mg/dL (ref 6–23)
GFR calc Af Amer: >60 mL/min (ref >60)

## 2010-05-09 LAB — URINALYSIS, ROUTINE W REFLEX MICROSCOPIC
Bilirubin Urine: NEGATIVE
Glucose, UA: NEGATIVE mg/dL
Protein, ur: NEGATIVE mg/dL

## 2010-05-09 LAB — LIPASE, BLOOD: Lipase: 14 U/L (ref 11–59)

## 2010-05-09 LAB — COMPREHENSIVE METABOLIC PANEL
BUN: 10 mg/dL (ref 6–23)
CO2: 28 mEq/L (ref 19–32)
Calcium: 9.6 mg/dL (ref 8.4–10.5)
Chloride: 103 mEq/L (ref 96–112)
Creatinine, Ser: 0.93 mg/dL (ref 0.4–1.2)
GFR calc Af Amer: >60 mL/min (ref >60)
Potassium: 3.6 mEq/L (ref 3.5–5.1)
Sodium: 137 mEq/L (ref 135–145)
Total Bilirubin: 0.6 mg/dL (ref 0.3–1.2)
Total Protein: 8.1 g/dL (ref 6.0–8.3)

## 2010-05-09 LAB — CBC
HCT: 31 % — ABNORMAL LOW (ref 36.0–46.0)
HCT: 31.4 % — ABNORMAL LOW (ref 36.0–46.0)
Hemoglobin: 10 g/dL — ABNORMAL LOW (ref 12.0–15.0)
Hemoglobin: 9.9 g/dL — ABNORMAL LOW (ref 12.0–15.0)
MCHC: 31.9 g/dL (ref 30.0–36.0)
MCV: 79.8 fL (ref 78.0–100.0)
Platelets: 377 10*3/uL (ref 150–400)
RBC: 3.86 MIL/uL — ABNORMAL LOW (ref 3.87–5.11)
RBC: 3.88 MIL/uL (ref 3.87–5.11)
RDW: 16.3 % — ABNORMAL HIGH (ref 11.5–15.5)
RDW: 16.4 % — ABNORMAL HIGH (ref 11.5–15.5)
WBC: 10.3 10*3/uL (ref 4.0–10.5)

## 2010-06-15 NOTE — H&P (Signed)
Gail Carlson, RITSEMA              ACCOUNT NO.:  [id number]   MEDICAL RECORD NO.:  [id number]          PATIENT TYPE:  EMS   LOCATION:  ED                           FACILITY:  [facility]   PHYSICIAN:  Velora Heckler, MD      DATE OF BIRTH:  1950-02-06   DATE OF ADMISSION:  09/05/2008  DATE OF DISCHARGE:                              HISTORY & PHYSICAL   CHIEF COMPLAINT:  Abdominal pain.   HISTORY OF PRESENT ILLNESS:  The patient is a 60 year old black female  from [place], [place].  She presents to the emergency  department with a 24-hour history of abdominal pain localizing to the  right lower quadrant.  This has been associated with nausea and  vomiting.  She denies diarrhea.  She does note sweats.  In the emergency  department, the patient was evaluated and found to have an elevated  white blood cell count of 12.7.  CT scan abdomen and pelvis raised the  possibility of early acute appendicitis.   PAST MEDICAL HISTORY:  1. Status post bilateral tubal ligation.  2. History of hypertension.  3. History of type 2 diabetes.   MEDICATIONS:  1. Metformin.  2. Blood pressure medicine of unknown type.   ALLERGIES:  NO KNOWN DRUG ALLERGIES.   PRIMARY PHYSICIAN:  [facility].   SOCIAL HISTORY:  The patient is widowed.  She has two daughters, one of  whom accompanies her.  She does smoke cigarettes.  She drinks alcohol  moderately.   FAMILY HISTORY:  Unremarkable.   REVIEW OF SYSTEMS:  A 15-system reviewed without significant other  findings except as noted above.   PHYSICAL EXAMINATION:  GENERAL:  A 60 year old moderately obese black  female on a stretcher in the emergency department.  VITAL SIGNS:  Temperature 99.5, pulse 96, respirations 20, blood  pressure 184/107.  HEENT:  Normocephalic, atraumatic.  Sclerae clear.  Conjunctivae clear.  Dentition fair.  Mucous membranes moist.  Voice  normal.  NECK:  Palpation of the neck shows no thyroid nodularity.  No  lymphadenopathy.  No mass.  No tenderness.  LUNGS:  Clear to auscultation bilaterally.  CARDIAC:  Regular rate and rhythm.  Peripheral pulses are full.  EXTREMITIES:  Nontender without edema.  ABDOMEN:  Moderately obese.  There is a well-healed surgical wound in  the midline below the umbilicus.  There are bowel sounds on  auscultation.  There is tenderness to percussion in the right lower  quadrant.  There is tenderness to palpation in the right lower quadrant  with voluntary guarding.  There is no palpable mass.  NEUROLOGICALLY:  The patient is alert and oriented without focal  deficit.  There is no sign of tremor.   LABORATORY DATA:  White count 12.7, hemoglobin 12.1, hematocrit 38.4%,  platelet count 377,000.  Differential shows 81% neutrophils, 12%  lymphocytes.  Electrolytes are normal.  Liver function tests are normal.  Creatinine normal at 0.93.  Urinalysis is benign.   DIAGNOSTICS:  CT scan of abdomen and pelvis is reviewed.  There are  inflammatory changes in the right lower quadrant.  There is  a slightly  dilated appendix, but there is air throughout the course of the  appendix.  There are inflammatory changes around the cecum.  Appendicitis cannot be ruled out.   IMPRESSION:  Probable acute appendicitis.   PLAN:  I discussed at length with the patient and her daughter the  indications for surgery.  We discussed the risk and benefits of  operative strategy versus nonoperative strategy.  At this point, I would  recommend laparoscopy and laparoscopic appendectomy.  The patient and  her daughter agree.  I discussed the risk of the procedure as well as  the possibility of conversion to open surgery.  They understand.  We  will initiate antibiotics and prepare the patient for the operating  room.      Velora Heckler, MD  Electronically Signed     TMG/MEDQ  D:  09/05/2008  T:  09/06/2008  Job:  [id number]   cc:   Velora Heckler, MD  [address]  [place]  [place]  [address]   [facility]  Fax: [phone]

## 2010-06-15 NOTE — Op Note (Signed)
NAMEGEORGENA, Gail Carlson              ACCOUNT NO.:  [id number]   MEDICAL RECORD NO.:  [id number]          PATIENT TYPE:  INP   LOCATION:  1539                         FACILITY:  [facility]   PHYSICIAN:  Velora Heckler, MD      DATE OF BIRTH:  1950-12-18   DATE OF PROCEDURE:  09/05/2008  DATE OF DISCHARGE:                               OPERATIVE REPORT   PREOPERATIVE DIAGNOSIS:  Acute appendicitis.   POSTOPERATIVE DIAGNOSIS:  Acute appendicitis.   PROCEDURE:  Laparoscopic appendectomy.   SURGEON:  Velora Heckler, M.D., FACS   ANESTHESIA:  General.   ESTIMATED BLOOD LOSS:  Minimal.   PREPARATION:  Betadine.   COMPLICATIONS:  None.   INDICATIONS:  The patient is a 59 year old black female who presents to  the emergency department with a 24-hour history of abdominal pain  localized to the right lower quadrant.  Laboratory studies showed an  elevated white blood cell count of 12.7 with a left shift.  A CT scan of  the abdomen and pelvis showed a thickened cecum.  Terminal ileum and  appendix with inflammatory changes worrisome for early acute  appendicitis.  The patient now comes to the operating room for  laparoscopy and appendectomy.   BODY OF REPORT:  Procedure is done in OR #1 at the [facility].  The patient is brought to the operating room, placed in a  supine position on the operating room table.  Following administration  of general anesthesia, the patient is prepped and draped in the usual  strict aseptic fashion.  After ascertaining that an adequate level of  anesthesia had been achieved, a supraumbilical incision is made in the  midline with a #15 blade.  Dissection is carried down through  subcutaneous tissues.  The fascia is incised in the midline and the  peritoneal cavity is entered cautiously.  Zero Vicryl pursestring suture  is placed in the fascia.  An Hassan cannula was introduced and secured  with a pursestring suture.  The abdomen is insufflated  with carbon  dioxide.  The laparoscope is introduced and the abdomen explored.  Adhesions are present.  Operative port is placed in the right upper  quadrant.  Using a harmonic scalpel,  adhesions are lysed from the  anterior abdominal wall, providing for better visualization.  A second  operative port is placed in the left lower quadrant.  Appendix is  visible in the right lower quadrant.  It is gently elevated.  Mesoappendix is divided with the harmonic scalpel used for hemostasis.  Dissection is carried back to the cecal wall.  The lateral cecal wall  appears thickened and inflamed.  This may be secondary to the appendix.  Appendix appears slightly dilated and indurated consistent with early  appendicitis.  However, this may represent typhlitis of the cecum.  Mesoappendix is cleared down to the base of the appendix.  Base of the  appendix is transected at its junction with the cecal wall with an Endo-  GIA stapler.  Appendix is placed into an EndoCatch bag and withdrawn  through the umbilical port.  Right  lower quadrant is irrigated with warm  saline.  Terminal ileum is grossly normal.  Fluid is evacuated.  Good  hemostasis is noted.  Ports are removed under direct vision.  Zero  Vicryl pursestring suture is tied securely after evacuating  pneumoperitoneum.  Port sites are anesthetized with local  anesthetic.  Skin incisions are closed with 4-0-Monocryl subcuticular  sutures.  Wounds are washed and dried and Benzoin and Steri-Strips are  applied.  Sterile dressings are applied.  The patient is awakened from  anesthesia and brought to the recovery room in stable condition.  The  patient tolerated the procedure well.      Velora Heckler, MD  Electronically Signed     TMG/MEDQ  D:  09/05/2008  T:  09/06/2008  Job:  [id number]

## 2011-08-25 ENCOUNTER — Other Ambulatory Visit (HOSPITAL_COMMUNITY): Payer: Self-pay | Admitting: Internal Medicine

## 2011-08-25 DIAGNOSIS — Z1231 Encounter for screening mammogram for malignant neoplasm of breast: Secondary | ICD-10-CM

## 2011-09-14 ENCOUNTER — Ambulatory Visit (HOSPITAL_COMMUNITY)
Admission: RE | Admit: 2011-09-14 | Discharge: 2011-09-14 | Disposition: A | Discharge status: Home / Self Care | Payer: Self-pay | Source: Ambulatory Visit | Attending: Internal Medicine | Admitting: Internal Medicine

## 2011-09-14 DIAGNOSIS — Z1231 Encounter for screening mammogram for malignant neoplasm of breast: Secondary | ICD-10-CM | POA: Insufficient documentation

## 2012-07-10 ENCOUNTER — Other Ambulatory Visit (HOSPITAL_COMMUNITY): Payer: Self-pay | Admitting: Family Medicine

## 2012-07-10 DIAGNOSIS — Z1231 Encounter for screening mammogram for malignant neoplasm of breast: Secondary | ICD-10-CM

## 2012-09-17 ENCOUNTER — Ambulatory Visit (HOSPITAL_COMMUNITY)
Admission: RE | Admit: 2012-09-17 | Discharge: 2012-09-17 | Disposition: A | Discharge status: Home / Self Care | Payer: Self-pay | Source: Ambulatory Visit | Attending: Family Medicine | Admitting: Family Medicine

## 2012-09-17 DIAGNOSIS — Z1231 Encounter for screening mammogram for malignant neoplasm of breast: Secondary | ICD-10-CM | POA: Insufficient documentation

## 2012-11-08 ENCOUNTER — Ambulatory Visit (HOSPITAL_COMMUNITY): Payer: No Typology Code available for payment source

## 2012-11-08 ENCOUNTER — Encounter (HOSPITAL_COMMUNITY): Payer: Self-pay | Admitting: Emergency Medicine

## 2012-11-08 ENCOUNTER — Emergency Department (HOSPITAL_COMMUNITY)
Admission: EM | Admit: 2012-11-08 | Discharge: 2012-11-08 | Disposition: A | Discharge status: Home / Self Care | Payer: No Typology Code available for payment source | Attending: Emergency Medicine | Admitting: Emergency Medicine

## 2012-11-08 DIAGNOSIS — Z79899 Other long term (current) drug therapy: Secondary | ICD-10-CM | POA: Insufficient documentation

## 2012-11-08 DIAGNOSIS — R109 Unspecified abdominal pain: Secondary | ICD-10-CM

## 2012-11-08 DIAGNOSIS — I10 Essential (primary) hypertension: Secondary | ICD-10-CM | POA: Insufficient documentation

## 2012-11-08 DIAGNOSIS — R1033 Periumbilical pain: Secondary | ICD-10-CM | POA: Insufficient documentation

## 2012-11-08 HISTORY — DX: Essential (primary) hypertension: I10

## 2012-11-08 LAB — CBC
Hemoglobin: 7.2 g/dL — ABNORMAL LOW (ref 12.0–15.0)
MCHC: 26.8 g/dL — ABNORMAL LOW (ref 30.0–36.0)
Platelets: 647 10*3/uL — ABNORMAL HIGH (ref 150–400)
RBC: 4.2 MIL/uL (ref 3.87–5.11)
RBC: 4.39 MIL/uL (ref 3.87–5.11)
WBC: 8.5 10*3/uL (ref 4.0–10.5)

## 2012-11-08 LAB — URINALYSIS, ROUTINE W REFLEX MICROSCOPIC
Hgb urine dipstick: NEGATIVE
Nitrite: NEGATIVE
Specific Gravity, Urine: 1.033 — ABNORMAL HIGH (ref 1.005–1.030)
Urobilinogen, UA: 0.2 mg/dL (ref 0.0–1.0)
pH: 5.5 (ref 5.0–8.0)

## 2012-11-08 LAB — COMPREHENSIVE METABOLIC PANEL
ALT: 6 U/L (ref 0–35)
AST: 11 U/L (ref 0–37)
Alkaline Phosphatase: 70 U/L (ref 39–117)
CO2: 26 mEq/L (ref 19–32)
Chloride: 99 mEq/L (ref 96–112)
GFR calc non Af Amer: 55 mL/min — ABNORMAL LOW (ref >90)
Sodium: 134 mEq/L — ABNORMAL LOW (ref 135–145)
Total Bilirubin: 0.3 mg/dL (ref 0.3–1.2)

## 2012-11-08 LAB — URINE MICROSCOPIC-ADD ON

## 2012-11-08 MED ORDER — IOHEXOL 300 MG/ML  SOLN
100.0000 mL | Freq: Once | INTRAMUSCULAR | Status: AC | PRN
  Administered 2012-11-08: 100 mL via INTRAVENOUS

## 2012-11-08 MED ORDER — ONDANSETRON HCL 4 MG/2ML IJ SOLN
4.0000 mg | Freq: Once | INTRAMUSCULAR | Status: AC
  Administered 2012-11-08: 4 mg via INTRAVENOUS
  Filled 2012-11-08: qty 2

## 2012-11-08 MED ORDER — FERROUS SULFATE 325 (65 FE) MG PO TABS: 325.0000 mg | ORAL_TABLET | Freq: Every day | ORAL | Status: DC

## 2012-11-08 MED ORDER — SODIUM CHLORIDE 0.9 % IV BOLUS (SEPSIS)
1000.0000 mL | Freq: Once | INTRAVENOUS | Status: AC
Start: 2012-11-08 — End: 2012-11-08
  Administered 2012-11-08: 1000 mL via INTRAVENOUS

## 2012-11-08 MED ORDER — HYDROCODONE-ACETAMINOPHEN 5-325 MG PO TABS: 1.0000 | ORAL_TABLET | Freq: Four times a day (QID) | ORAL | Status: DC | PRN

## 2012-11-08 MED ORDER — IOHEXOL 300 MG/ML  SOLN
50.0000 mL | Freq: Once | INTRAMUSCULAR | Status: AC | PRN
  Administered 2012-11-08: 50 mL via ORAL

## 2012-11-08 NOTE — ED Provider Notes (Signed)
CSN: [id number]     Arrival date & time 11/08/12  1045 History   First MD Initiated Contact with Patient 11/08/12 1053     Chief Complaint  Patient presents with  . Abdominal Pain   (Consider location/radiation/quality/duration/timing/severity/associated sxs/prior Treatment) Patient is a 62 y.o. female presenting with abdominal pain. The history is provided by the patient.  Abdominal Pain Pain location:  Periumbilical Pain quality: burning   Pain radiates to:  Does not radiate Pain severity:  Mild Onset quality:  Gradual Duration:  4 weeks Timing:  Intermittent Progression:  Worsening Chronicity:  New Ineffective treatments: PPIs, although only taking them every other day. Associated symptoms: no cough, no fever and no shortness of breath     Past Medical History  Diagnosis Date  . Hypertension    No past surgical history on file. No family history on file. History  Substance Use Topics  . Smoking status: Not on file  . Smokeless tobacco: Not on file  . Alcohol Use: Not on file   OB History   Grav Para Term Preterm Abortions TAB SAB Ect Mult Living                 Review of Systems  Constitutional: Negative for fever.  Respiratory: Negative for cough and shortness of breath.   Gastrointestinal: Positive for abdominal pain.  All other systems reviewed and are negative.    Allergies  Review of patient's allergies indicates no known allergies.  Home Medications   Current Outpatient Rx  Name  Route  Sig  Dispense  Refill  . amLODipine (NORVASC) 10 MG tablet   Oral   Take 10 mg by mouth daily.         . hydrOXYzine (ATARAX/VISTARIL) 50 MG tablet   Oral   Take 50 mg by mouth at bedtime as needed (sleep).         Marland Kitchen lisinopril (PRINIVIL,ZESTRIL) 40 MG tablet   Oral   Take 40 mg by mouth daily.         . pantoprazole (PROTONIX) 40 MG tablet   Oral   Take 40 mg by mouth daily.         . ferrous sulfate 325 (65 FE) MG tablet   Oral   Take 1 tablet  (325 mg total) by mouth daily with breakfast.   90 tablet   3     Please take with orange juice for best absorption.   Marland Kitchen HYDROcodone-acetaminophen (NORCO/VICODIN) 5-325 MG per tablet   Oral   Take 1 tablet by mouth every 6 (six) hours as needed for pain.   20 tablet   0    BP 179/83  Pulse 73  Temp(Src) 98.9 F (37.2 C) (Oral)  Resp 14  SpO2 100% Physical Exam  Nursing note and vitals reviewed. Constitutional: She is oriented to person, place, and time. She appears well-developed and well-nourished. No distress.  HENT:  Head: Normocephalic and atraumatic.  Eyes: EOM are normal. Pupils are equal, round, and reactive to light.  Neck: Normal range of motion. Neck supple.  Cardiovascular: Normal rate and regular rhythm.  Exam reveals no friction rub.   No murmur heard. Pulmonary/Chest: Effort normal and breath sounds normal. No respiratory distress. She has no wheezes. She has no rales.  Abdominal: Soft. She exhibits no distension. There is tenderness (very mild, central). There is no rebound.  Musculoskeletal: Normal range of motion. She exhibits no edema.  Neurological: She is alert and oriented to person, place, and  time.  Skin: She is not diaphoretic.    ED Course  Procedures (including critical care time) Labs Review Labs Reviewed  URINALYSIS, ROUTINE W REFLEX MICROSCOPIC - Abnormal; Notable for the following:    APPearance CLOUDY (*)    Specific Gravity, Urine 1.033 (*)    Bilirubin Urine SMALL (*)    Protein, ur 30 (*)    Leukocytes, UA SMALL (*)    All other components within normal limits  CBC - Abnormal; Notable for the following:    Hemoglobin 7.3 (*)    HCT 28.0 (*)    MCV 63.8 (*)    MCH 16.6 (*)    MCHC 26.1 (*)    RDW 22.3 (*)    Platelets 647 (*)    All other components within normal limits  COMPREHENSIVE METABOLIC PANEL - Abnormal; Notable for the following:    Sodium 134 (*)    Glucose, Bld 135 (*)    Albumin 3.4 (*)    GFR calc non Af Amer 55  (*)    GFR calc Af Amer 64 (*)    All other components within normal limits  URINE MICROSCOPIC-ADD ON - Abnormal; Notable for the following:    Squamous Epithelial / LPF MANY (*)    Bacteria, UA FEW (*)    All other components within normal limits  CBC - Abnormal; Notable for the following:    Hemoglobin 7.2 (*)    HCT 26.9 (*)    MCV 64.0 (*)    MCH 17.1 (*)    MCHC 26.8 (*)    RDW 22.3 (*)    Platelets 638 (*)    All other components within normal limits  LIPASE, BLOOD  LACTIC ACID, PLASMA   Imaging Review Ct Abdomen Pelvis W Contrast  11/08/2012   CLINICAL DATA:  62 year old female with abdominal pain, nausea.  EXAM: CT ABDOMEN AND PELVIS WITH CONTRAST  TECHNIQUE: Multidetector CT imaging of the abdomen and pelvis was performed using the standard protocol following bolus administration of intravenous contrast.  CONTRAST:  OMNIPAQUE IOHEXOL 300 MG/ML  SOLN  COMPARISON:  09/05/2008.  FINDINGS: Cardiomegaly appears increased. No pericardial effusion. Negative lung bases. No pleural effusion.  Small to moderate hiatal hernia mildly increased.  Multilevel lumbar facet degeneration. Widespread chronic disc degeneration. No acute osseous abnormality identified.  Small volume pelvic free fluid. Negative rectum. Uterus and adnexa are within normal limits. Unremarkable bladder.  Negative sigmoid colon. Redundant but otherwise negative left colon and splenic flexure. Negative transverse colon. Oral contrast has reached the hepatic flexure which is within normal limits.  Marked circumferential wall thickening in the terminal ileum extending to the ileocecal valve and with similar Severe wall thickening involving the tip of the cecum. See coronal image 59. The involved terminal ileum segment is nearly 20 cm long. There is associated stenosis (series 2, image 60 and coronal image 51). Upstream small bowel loops are mildly dilated and contrast filled, but not inflamed.  No associated mesenteric  lymphadenopathy. No other abnormal small bowel loops are identified. Distal stomach and duodenum are within normal limits.  Trace perihepatic free fluid. Otherwise negative liver and gallbladder. Portal venous system is patent. Spleen, and pancreas are within normal limits.  Stable bilateral adrenal gland thickening and or left adrenal 2 cm mass, since 2010.  There is mildly heterogeneous early renal contrast enhancement. Delayed renal excretory images appear normal. No perinephric stranding, hydronephrosis or hydroureter.  Major arterial structures in the abdomen and pelvis are patent.  IMPRESSION: 1. Severely abnormal nearly 20 cm segment of terminal ileum, ileocecal valve, and cecal tip. Pronounced circumferential wall thickening and indistinctness. Associated small volume of free fluid in the abdomen and pelvis. No associated mesenteric lymphadenopathy. No other abnormal bowel segment.  The appearance favors inflammatory bowel disease, especially Crohn disease. Query any earlier history of that entity in this patient.  Other differential considerations seem less likely including infection (typhlitis), or neoplasm such as distal small bowel lymphoma.  2. Stable chronic left greater than right adrenal enlargement, benign.  3. Cardiomegaly, increased since 2010. Small to moderate hiatal hernia appears mildly larger.   Electronically Signed   By: Augusto Gamble M.D.   On: 11/08/2012 14:27    EKG Interpretation   None       MDM   1. Abdominal pain    62 year old female presents with abdominal pain. She's been treated for reflux by her primary doctor with PPIs without relief. She's coming in today with periumbilical burning pain. She has no nausea, vomiting, diarrhea. She does state some fatigue. Denies any rectal bleeding. Vitals are stable here. On exam shows mild tenderness at the umbilicus. No guarding or rebound or other masses. She has no peritoneal signs. I will CT scan her check labs and she is a  words and abdominal pain. Labs show marked microcytic anemia. This is consistent on repeat lab draw. CT scan shows a large segment of her colon with inflammation concerning for Crohn's disease. I spoke with Dr. Oscar La of GI who stated that she can followup next week. He stated admission at this time but not serve her well, but she can followup next week. He also recommended initiating iron therapy for her microcytic anemia. Patient also given Vicodin for her pain. Stable for discharge.      Dagmar Hait, MD 11/08/12 [phone]

## 2012-11-08 NOTE — ED Notes (Signed)
Pt has been having abd pain for the past 2 weeks has seen her pcp and was given protonix states that it feels like her bladder is filling up with pressure and gives her a nausea feeling. No emesis no fever.

## 2012-11-08 NOTE — ED Notes (Signed)
Patient transported to CT 

## 2012-11-08 NOTE — Progress Notes (Signed)
[facility] spoke with patient about her [facility]. Patient confirmed her PCP was [facility]. Set patient up with a follow-up apt on 10/20.

## 2012-11-12 ENCOUNTER — Encounter: Payer: Self-pay | Admitting: Gastroenterology

## 2012-12-26 ENCOUNTER — Ambulatory Visit: Payer: No Typology Code available for payment source | Admitting: Gastroenterology

## 2013-01-31 DIAGNOSIS — Z923 Personal history of irradiation: Secondary | ICD-10-CM

## 2013-01-31 DIAGNOSIS — Z9221 Personal history of antineoplastic chemotherapy: Secondary | ICD-10-CM

## 2013-01-31 HISTORY — DX: Personal history of irradiation: Z92.3

## 2013-01-31 HISTORY — DX: Personal history of antineoplastic chemotherapy: Z92.21

## 2013-06-28 ENCOUNTER — Emergency Department (HOSPITAL_COMMUNITY)
Admission: EM | Admit: 2013-06-28 | Discharge: 2013-06-28 | Disposition: A | Discharge status: Home / Self Care | Payer: Self-pay | Attending: Emergency Medicine | Admitting: Emergency Medicine

## 2013-06-28 ENCOUNTER — Ambulatory Visit (HOSPITAL_COMMUNITY): Payer: Self-pay

## 2013-06-28 ENCOUNTER — Encounter (HOSPITAL_COMMUNITY): Payer: Self-pay | Admitting: Emergency Medicine

## 2013-06-28 DIAGNOSIS — K529 Noninfective gastroenteritis and colitis, unspecified: Secondary | ICD-10-CM

## 2013-06-28 DIAGNOSIS — Z79899 Other long term (current) drug therapy: Secondary | ICD-10-CM | POA: Insufficient documentation

## 2013-06-28 DIAGNOSIS — K5289 Other specified noninfective gastroenteritis and colitis: Secondary | ICD-10-CM | POA: Insufficient documentation

## 2013-06-28 DIAGNOSIS — F17200 Nicotine dependence, unspecified, uncomplicated: Secondary | ICD-10-CM | POA: Insufficient documentation

## 2013-06-28 DIAGNOSIS — D649 Anemia, unspecified: Secondary | ICD-10-CM | POA: Insufficient documentation

## 2013-06-28 DIAGNOSIS — I10 Essential (primary) hypertension: Secondary | ICD-10-CM | POA: Insufficient documentation

## 2013-06-28 DIAGNOSIS — Z9089 Acquired absence of other organs: Secondary | ICD-10-CM | POA: Insufficient documentation

## 2013-06-28 HISTORY — DX: Anemia, unspecified: D64.9

## 2013-06-28 LAB — URINE MICROSCOPIC-ADD ON

## 2013-06-28 LAB — CBC WITH DIFFERENTIAL/PLATELET
BASOS PCT: 0 % (ref 0–1)
Basophils Absolute: 0 10*3/uL (ref 0.0–0.1)
EOS ABS: 0 10*3/uL (ref 0.0–0.7)
Eosinophils Relative: 0 % (ref 0–5)
HCT: 26 % — ABNORMAL LOW (ref 36.0–46.0)
Hemoglobin: 7.7 g/dL — ABNORMAL LOW (ref 12.0–15.0)
Lymphocytes Relative: 4 % — ABNORMAL LOW (ref 12–46)
Lymphs Abs: 0.6 10*3/uL — ABNORMAL LOW (ref 0.7–4.0)
MCH: 24.2 pg — AB (ref 26.0–34.0)
MCHC: 29.6 g/dL — AB (ref 30.0–36.0)
MCV: 81.8 fL (ref 78.0–100.0)
MONO ABS: 1.3 10*3/uL — AB (ref 0.1–1.0)
Monocytes Relative: 8 % (ref 3–12)
NEUTROS PCT: 88 % — AB (ref 43–77)
Neutro Abs: 14.3 10*3/uL — ABNORMAL HIGH (ref 1.7–7.7)
Platelets: 397 10*3/uL (ref 150–400)
RBC: 3.18 MIL/uL — ABNORMAL LOW (ref 3.87–5.11)
RDW: 15.5 % (ref 11.5–15.5)
WBC: 16.2 10*3/uL — ABNORMAL HIGH (ref 4.0–10.5)

## 2013-06-28 LAB — URINALYSIS, ROUTINE W REFLEX MICROSCOPIC
GLUCOSE, UA: NEGATIVE mg/dL
Hgb urine dipstick: NEGATIVE
Ketones, ur: NEGATIVE mg/dL
Nitrite: NEGATIVE
PH: 6 (ref 5.0–8.0)
Protein, ur: 30 mg/dL — AB
Specific Gravity, Urine: 1.029 (ref 1.005–1.030)
Urobilinogen, UA: 1 mg/dL (ref 0.0–1.0)

## 2013-06-28 LAB — COMPREHENSIVE METABOLIC PANEL
ALBUMIN: 2.9 g/dL — AB (ref 3.5–5.2)
ALT: 6 U/L (ref 0–35)
AST: 9 U/L (ref 0–37)
Alkaline Phosphatase: 82 U/L (ref 39–117)
BUN: 14 mg/dL (ref 6–23)
CO2: 26 mEq/L (ref 19–32)
CREATININE: 1.04 mg/dL (ref 0.50–1.10)
Calcium: 8.4 mg/dL (ref 8.4–10.5)
Chloride: 102 mEq/L (ref 96–112)
GFR calc Af Amer: 64 mL/min — ABNORMAL LOW (ref >90)
GFR calc non Af Amer: 56 mL/min — ABNORMAL LOW (ref >90)
Glucose, Bld: 134 mg/dL — ABNORMAL HIGH (ref 70–99)
POTASSIUM: 3.8 meq/L (ref 3.7–5.3)
Sodium: 139 mEq/L (ref 137–147)
TOTAL PROTEIN: 6.6 g/dL (ref 6.0–8.3)
Total Bilirubin: 0.4 mg/dL (ref 0.3–1.2)

## 2013-06-28 LAB — LIPASE, BLOOD: LIPASE: 12 U/L (ref 11–59)

## 2013-06-28 MED ORDER — MORPHINE SULFATE 4 MG/ML IJ SOLN
2.0000 mg | Freq: Once | INTRAMUSCULAR | Status: AC
  Administered 2013-06-28: 2 mg via INTRAVENOUS
  Filled 2013-06-28: qty 1

## 2013-06-28 MED ORDER — CIPROFLOXACIN IN D5W 400 MG/200ML IV SOLN
400.0000 mg | Freq: Once | INTRAVENOUS | Status: AC
  Administered 2013-06-28: 400 mg via INTRAVENOUS
  Filled 2013-06-28: qty 200

## 2013-06-28 MED ORDER — METRONIDAZOLE IN NACL 5-0.79 MG/ML-% IV SOLN
500.0000 mg | Freq: Once | INTRAVENOUS | Status: AC
  Administered 2013-06-28: 500 mg via INTRAVENOUS
  Filled 2013-06-28: qty 100

## 2013-06-28 MED ORDER — MORPHINE SULFATE 4 MG/ML IJ SOLN
4.0000 mg | Freq: Once | INTRAMUSCULAR | Status: AC
  Administered 2013-06-28: 4 mg via INTRAVENOUS
  Filled 2013-06-28: qty 1

## 2013-06-28 MED ORDER — CIPROFLOXACIN HCL 500 MG PO TABS: 500.0000 mg | ORAL_TABLET | Freq: Two times a day (BID) | ORAL | Status: DC

## 2013-06-28 MED ORDER — METRONIDAZOLE 500 MG PO TABS: 500.0000 mg | ORAL_TABLET | Freq: Two times a day (BID) | ORAL | Status: DC

## 2013-06-28 MED ORDER — ONDANSETRON HCL 4 MG/2ML IJ SOLN
4.0000 mg | Freq: Once | INTRAMUSCULAR | Status: AC
  Administered 2013-06-28: 4 mg via INTRAVENOUS
  Filled 2013-06-28: qty 2

## 2013-06-28 MED ORDER — HYDROCODONE-ACETAMINOPHEN 5-325 MG PO TABS: 1.0000 | ORAL_TABLET | Freq: Four times a day (QID) | ORAL | Status: DC | PRN

## 2013-06-28 MED ORDER — SODIUM CHLORIDE 0.9 % IV BOLUS (SEPSIS)
1000.0000 mL | Freq: Once | INTRAVENOUS | Status: AC
  Administered 2013-06-28: 1000 mL via INTRAVENOUS

## 2013-06-28 NOTE — Progress Notes (Signed)
[facility] provided pt with a [employer] application. Patient had [place] with [facility] [place], but it expired 05/16/13. Patient stated that she was in the process of getting paperwork together to re-enrollment.

## 2013-06-28 NOTE — ED Provider Notes (Signed)
CSN: [id number]     Arrival date & time 06/28/13  0626 History   First MD Initiated Contact with Patient 06/28/13 0719     Chief Complaint  Patient presents with  . Abdominal Pain      HPI  Patient presents with abdominal pain.  Pain began approximately one week ago, though it became worse over the past 2 days. The pain is focally in the right lower quadrant, with radiation inferiorly.  The pain is sore, severe, sharp. There is associated nausea, generalized abdominal discomfort, vomiting. There is no diarrhea, objective fever, chest pain, dyspnea, hematuria, though there is polyuria. No relief with OTC medication. No clear exacerbating factors.   Past Medical History  Diagnosis Date  . Hypertension   . Anemia    Past Surgical History  Procedure Laterality Date  . Appendectomy     No family history on file. History  Substance Use Topics  . Smoking status: Current Every Day Smoker -- 0.50 packs/day    Types: Cigarettes  . Smokeless tobacco: Not on file  . Alcohol Use: Yes     Comment: occ   OB History   Grav Para Term Preterm Abortions TAB SAB Ect Mult Living                 Review of Systems  Constitutional:       Per HPI, otherwise negative  HENT:       Per HPI, otherwise negative  Respiratory:       Per HPI, otherwise negative  Cardiovascular:       Per HPI, otherwise negative  Gastrointestinal: Positive for vomiting.  Endocrine:       Negative aside from HPI  Genitourinary:       Neg aside from HPI   Musculoskeletal:       Per HPI, otherwise negative  Skin: Negative.   Neurological: Negative for syncope.      Allergies  Review of patient's allergies indicates no known allergies.  Home Medications   Prior to Admission medications   Medication Sig Start Date End Date Taking? Authorizing Provider  amLODipine (NORVASC) 10 MG tablet Take 10 mg by mouth daily.    Historical Provider, MD  ferrous sulfate 325 (65 FE) MG tablet Take 1 tablet (325 mg  total) by mouth daily with breakfast. 11/08/12   Osvaldo Shipper, MD  HYDROcodone-acetaminophen (NORCO/VICODIN) 5-325 MG per tablet Take 1 tablet by mouth every 6 (six) hours as needed for pain. 11/08/12   Osvaldo Shipper, MD  hydrOXYzine (ATARAX/VISTARIL) 50 MG tablet Take 50 mg by mouth at bedtime as needed (sleep).    Historical Provider, MD  lisinopril (PRINIVIL,ZESTRIL) 40 MG tablet Take 40 mg by mouth daily.    Historical Provider, MD  pantoprazole (PROTONIX) 40 MG tablet Take 40 mg by mouth daily.    Historical Provider, MD   BP 128/77  Pulse 108  Temp(Src) 101.1 F (38.4 C) (Oral)  Resp 18  Ht 5\' 3"  (1.6 m)  Wt 185 lb (83.915 kg)  BMI 32.78 kg/m2  SpO2 98% Physical Exam  Nursing note and vitals reviewed. Constitutional: She is oriented to person, place, and time. She appears well-developed and well-nourished. No distress.  HENT:  Head: Normocephalic and atraumatic.  Eyes: Conjunctivae and EOM are normal.  Cardiovascular: Normal rate and regular rhythm.   Pulmonary/Chest: Effort normal and breath sounds normal. No stridor. No respiratory distress.  Abdominal: She exhibits no distension. There is tenderness in the right lower quadrant.  Musculoskeletal: She exhibits no edema.  Neurological: She is alert and oriented to person, place, and time. No cranial nerve deficit.  Skin: Skin is warm and dry.  Psychiatric: She has a normal mood and affect.    ED Course  Procedures (including critical care time) Labs Review Labs Reviewed  CBC WITH DIFFERENTIAL - Abnormal; Notable for the following:    WBC 16.2 (*)    RBC 3.18 (*)    Hemoglobin 7.7 (*)    HCT 26.0 (*)    MCH 24.2 (*)    MCHC 29.6 (*)    Neutrophils Relative % 88 (*)    Neutro Abs 14.3 (*)    Lymphocytes Relative 4 (*)    Lymphs Abs 0.6 (*)    Monocytes Absolute 1.3 (*)    All other components within normal limits  COMPREHENSIVE METABOLIC PANEL - Abnormal; Notable for the following:    Glucose, Bld 134  (*)    Albumin 2.9 (*)    GFR calc non Af Amer 56 (*)    GFR calc Af Amer 64 (*)    All other components within normal limits  URINALYSIS, ROUTINE W REFLEX MICROSCOPIC - Abnormal; Notable for the following:    Color, Urine AMBER (*)    APPearance CLOUDY (*)    Bilirubin Urine SMALL (*)    Protein, ur 30 (*)    Leukocytes, UA SMALL (*)    All other components within normal limits  URINE MICROSCOPIC-ADD ON - Abnormal; Notable for the following:    Squamous Epithelial / LPF FEW (*)    All other components within normal limits  LIPASE, BLOOD    Imaging Review Ct Abdomen Pelvis Wo Contrast  06/28/2013   CLINICAL DATA:  Abdominal pain on 1 week duration. Right lower quadrant pain.  EXAM: CT ABDOMEN AND PELVIS WITHOUT CONTRAST  TECHNIQUE: Multidetector CT imaging of the abdomen and pelvis was performed following the standard protocol without IV contrast.  COMPARISON:  11/08/2012  FINDINGS: Lung bases are clear. Small amount of pericardial fluid. No pleural fluid.  The liver appears normal without contrast. No calcified gallstones. Spleen is normal. The pancreas is normal. Chronic bilateral adrenal enlargement is unchanged. The kidneys are normal without cyst, mass, stone or hydronephrosis. There is atherosclerosis of the aorta but no aneurysm. The IVC is unremarkable.  There is acute an extensive pathology in the right lower quadrant affecting the bowel. This study was done as a low dose renal stone protocol and therefore detail is somewhat limited and there is no contrast to is cyst in the interpretation. There appears to be wall thickening and inflammation of the distal small intestine in the cecum. I can not specifically resolve the appendix. I think there are surgical clips in the region from a previous appendectomy. There is stranding of the regional fat. There is a small amount of free intraperitoneal fluid. No free intraperitoneal air is seen. No localized collection to suggest focal abscess. The  appearance is worrisome for bowel inflammation, either due to infectious inflammation or inflammatory bowel disease. Neoplastic disease seems less likely.  Ordinary degenerative change affects the lumbar spine.  IMPRESSION: Acute inflammatory process in the right lower quadrant affecting the distal small intestine and the right colon. Extensive regional inflammatory change. Some free fluid. No evidence of free air or focal abscess. Findings are worrisome for infectious inflammation or inflammatory bowel disease. Neoplastic involvement/lymphoma would be less likely but theoretically possible. Scan with oral and intravenous contrast would have some advantages  in delineating the degree of involvement.   Electronically Signed   By: Nelson Chimes M.D.   On: 06/28/2013 11:13    I reviewed the patient's electronic medical record  1:43 PM Patient smiling.  IV ABX continue.  She states that she is much better.  2:36 PM Patient ambulatory.  No complaints.  MDM   Patient presents with ongoing right lower quadrant pain.  On exam patient is awake alert, but febrile, tachycardic.  Patient's fever and tachycardia resolved with fluids, antibiotics.  The patient's CT scan demonstrates enteritidis/colitis with no perforation or abscess. Following initiation of antibiotics, with fluid resuscitation, as the symptoms resolved.  She was hemodynamically stable, discharged to follow with gastroenterology.    Carmin Muskrat, MD 06/28/13 [phone]

## 2013-06-28 NOTE — ED Notes (Signed)
Pt states that she has indigestion for approx 1 week; pt states that she began having RLQ pain that began Wed and has gotten progressively worse; pt c/o N/V; denies diarrhea; pt states that she has been unable to eat due to GERD and nausea

## 2013-06-28 NOTE — Discharge Instructions (Signed)
As discussed, your abdominal pain is likely due to inflammation and/or infection of your small intestine and colon.  It is important to take all medication as directed, and be sure to follow up with our gastroenterologists.  Return here for any concerning changes in your condition.   Colitis Colitis is inflammation of the colon. Colitis can be a short-term or long-standing (chronic) illness. Crohn's disease and ulcerative colitis are 2 types of colitis which are chronic. They usually require lifelong treatment. CAUSES  There are many different causes of colitis, including:  Viruses.  Germs (bacteria).  Medicine reactions. SYMPTOMS   Diarrhea.  Intestinal bleeding.  Pain.  Fever.  Throwing up (vomiting).  Tiredness (fatigue).  Weight loss.  Bowel blockage. DIAGNOSIS  The diagnosis of colitis is based on examination and stool or blood tests. X-rays, CT scan, and colonoscopy may also be needed. TREATMENT  Treatment may include:  Fluids given through the vein (intravenously).  Bowel rest (nothing to eat or drink for a period of time).  Medicine for pain and diarrhea.  Medicines (antibiotics) that kill germs.  Cortisone medicines.  Surgery. HOME CARE INSTRUCTIONS   Get plenty of rest.  Drink enough water and fluids to keep your urine clear or pale yellow.  Eat a well-balanced diet.  Call your caregiver for follow-up as recommended. SEEK IMMEDIATE MEDICAL CARE IF:   You develop chills.  You have an oral temperature above 102 F (38.9 C), not controlled by medicine.  You have extreme weakness, fainting, or dehydration.  You have repeated vomiting.  You develop severe belly (abdominal) pain or are passing bloody or tarry stools. MAKE SURE YOU:   Understand these instructions.  Will watch your condition.  Will get help right away if you are not doing well or get worse. Document Released: 02/25/2004 Document Revised: 04/11/2011 Document Reviewed:  05/22/2009 [facility] Patient Information 2014 [place], [place].

## 2013-11-04 ENCOUNTER — Encounter (HOSPITAL_COMMUNITY): Payer: Self-pay | Admitting: Emergency Medicine

## 2013-11-04 ENCOUNTER — Emergency Department (HOSPITAL_COMMUNITY)
Admission: EM | Admit: 2013-11-04 | Discharge: 2013-11-05 | Disposition: A | Discharge status: Home / Self Care | Payer: Medicare Other | Attending: Emergency Medicine | Admitting: Emergency Medicine

## 2013-11-04 DIAGNOSIS — I10 Essential (primary) hypertension: Secondary | ICD-10-CM | POA: Diagnosis not present

## 2013-11-04 DIAGNOSIS — R1033 Periumbilical pain: Secondary | ICD-10-CM | POA: Insufficient documentation

## 2013-11-04 DIAGNOSIS — D649 Anemia, unspecified: Secondary | ICD-10-CM

## 2013-11-04 DIAGNOSIS — Z720 Tobacco use: Secondary | ICD-10-CM | POA: Diagnosis not present

## 2013-11-04 DIAGNOSIS — R112 Nausea with vomiting, unspecified: Secondary | ICD-10-CM | POA: Diagnosis not present

## 2013-11-04 DIAGNOSIS — Z79899 Other long term (current) drug therapy: Secondary | ICD-10-CM | POA: Insufficient documentation

## 2013-11-04 DIAGNOSIS — Z9089 Acquired absence of other organs: Secondary | ICD-10-CM | POA: Diagnosis not present

## 2013-11-04 LAB — CBC WITH DIFFERENTIAL/PLATELET
BASOS ABS: 0 10*3/uL (ref 0.0–0.1)
Basophils Relative: 0 % (ref 0–1)
EOS ABS: 0 10*3/uL (ref 0.0–0.7)
Eosinophils Relative: 0 % (ref 0–5)
HCT: 26.6 % — ABNORMAL LOW (ref 36.0–46.0)
Hemoglobin: 7.9 g/dL — ABNORMAL LOW (ref 12.0–15.0)
Lymphocytes Relative: 7 % — ABNORMAL LOW (ref 12–46)
Lymphs Abs: 0.6 10*3/uL — ABNORMAL LOW (ref 0.7–4.0)
MCH: 22.2 pg — ABNORMAL LOW (ref 26.0–34.0)
MCHC: 29.7 g/dL — AB (ref 30.0–36.0)
MCV: 74.7 fL — ABNORMAL LOW (ref 78.0–100.0)
Monocytes Absolute: 0.2 10*3/uL (ref 0.1–1.0)
Monocytes Relative: 2 % — ABNORMAL LOW (ref 3–12)
NEUTROS ABS: 8.1 10*3/uL — AB (ref 1.7–7.7)
NEUTROS PCT: 91 % — AB (ref 43–77)
Platelets: 440 10*3/uL — ABNORMAL HIGH (ref 150–400)
RBC: 3.56 MIL/uL — ABNORMAL LOW (ref 3.87–5.11)
RDW: 16.2 % — AB (ref 11.5–15.5)
WBC: 8.9 10*3/uL (ref 4.0–10.5)

## 2013-11-04 LAB — COMPREHENSIVE METABOLIC PANEL
ALBUMIN: 3.5 g/dL (ref 3.5–5.2)
ALK PHOS: 102 U/L (ref 39–117)
ALT: 7 U/L (ref 0–35)
ANION GAP: 11 (ref 5–15)
AST: 10 U/L (ref 0–37)
BUN: 14 mg/dL (ref 6–23)
CO2: 25 mEq/L (ref 19–32)
Calcium: 9.4 mg/dL (ref 8.4–10.5)
Chloride: 101 mEq/L (ref 96–112)
Creatinine, Ser: 1.16 mg/dL — ABNORMAL HIGH (ref 0.50–1.10)
GFR calc Af Amer: 56 mL/min — ABNORMAL LOW (ref >90)
GFR calc non Af Amer: 49 mL/min — ABNORMAL LOW (ref >90)
Glucose, Bld: 163 mg/dL — ABNORMAL HIGH (ref 70–99)
Potassium: 3.9 mEq/L (ref 3.7–5.3)
SODIUM: 137 meq/L (ref 137–147)
TOTAL PROTEIN: 7.6 g/dL (ref 6.0–8.3)
Total Bilirubin: <0.2 mg/dL — ABNORMAL LOW (ref 0.3–1.2)

## 2013-11-04 LAB — URINALYSIS, ROUTINE W REFLEX MICROSCOPIC
Bilirubin Urine: NEGATIVE
Glucose, UA: NEGATIVE mg/dL
HGB URINE DIPSTICK: NEGATIVE
Ketones, ur: NEGATIVE mg/dL
LEUKOCYTES UA: NEGATIVE
Nitrite: NEGATIVE
Protein, ur: NEGATIVE mg/dL
Specific Gravity, Urine: 1.014 (ref 1.005–1.030)
UROBILINOGEN UA: 0.2 mg/dL (ref 0.0–1.0)
pH: 7.5 (ref 5.0–8.0)

## 2013-11-04 LAB — LIPASE, BLOOD: Lipase: 31 U/L (ref 11–59)

## 2013-11-04 MED ORDER — GI COCKTAIL ~~LOC~~
30.0000 mL | Freq: Once | ORAL | Status: AC
  Administered 2013-11-04: 30 mL via ORAL
  Filled 2013-11-04: qty 30

## 2013-11-04 MED ORDER — ONDANSETRON 8 MG PO TBDP
8.0000 mg | ORAL_TABLET | Freq: Once | ORAL | Status: AC
  Administered 2013-11-04: 8 mg via ORAL
  Filled 2013-11-04: qty 1

## 2013-11-04 NOTE — ED Provider Notes (Signed)
CSN: [id number]     Arrival date & time 11/04/13  2110 History   First MD Initiated Contact with Patient 11/04/13 2304     Chief Complaint  Patient presents with  . Abdominal Pain     (Consider location/radiation/quality/duration/timing/severity/associated sxs/prior Treatment) HPI 63 yo female presents to the ER from home with complaint of periumbilical pain since around 2 pm this afternoon after eating fish sandwich.  She has had 2 loose stools today, 2 episodes of vomiting.  No fevers, chills, no sick contacts.  Pt reports since having her appendix out 3 years ago she has had ongoing issues with her stomach, was told to f/u with GI and has not yet been able to afford to go.  Pt now has medicare, and plans on following up with [facility].  Pain is dull, mild, burning and much improved from earlier.  Pt with h/o anemia, reports she takes iron but not consistently as it causes constipation.  She does not like taking the miralax prescribed to her from her pcm as it then causes loose stools and she does not like taking medications.  She reports h/o GERD, takes protonix.  She report she has occasional vaginal spotting since going through menopause in her 68s. Past Medical History  Diagnosis Date  . Hypertension   . Anemia    Past Surgical History  Procedure Laterality Date  . Appendectomy     History reviewed. No pertinent family history. History  Substance Use Topics  . Smoking status: Current Every Day Smoker -- 0.50 packs/day    Types: Cigarettes  . Smokeless tobacco: Not on file  . Alcohol Use: Yes     Comment: occ   OB History   Grav Para Term Preterm Abortions TAB SAB Ect Mult Living                 Review of Systems  All other systems reviewed and are negative.     Allergies  Review of patient's allergies indicates no known allergies.  Home Medications   Prior to Admission medications   Medication Sig Start Date End Date Taking? Authorizing Provider  amLODipine  (NORVASC) 10 MG tablet Take 10 mg by mouth daily.   Yes Historical Provider, MD  ferrous sulfate 325 (65 FE) MG tablet Take 1 tablet (325 mg total) by mouth daily with breakfast. 11/08/12  Yes Evelina Bucy, MD  pantoprazole (PROTONIX) 40 MG tablet Take 40 mg by mouth daily.   Yes Historical Provider, MD   BP 169/98  Pulse 84  Temp(Src) 98.1 F (36.7 C) (Oral)  Resp 18  SpO2 97% Physical Exam  Nursing note and vitals reviewed. Constitutional: She is oriented to person, place, and time. She appears well-developed and well-nourished. No distress.  HENT:  Head: Normocephalic and atraumatic.  Nose: Nose normal.  Mouth/Throat: Oropharynx is clear and moist.  Eyes: Conjunctivae and EOM are normal. Pupils are equal, round, and reactive to light.  Neck: Normal range of motion. Neck supple. No JVD present. No tracheal deviation present. No thyromegaly present.  Cardiovascular: Normal rate, regular rhythm, normal heart sounds and intact distal pulses.  Exam reveals no gallop and no friction rub.   No murmur heard. Pulmonary/Chest: Effort normal and breath sounds normal. No stridor. No respiratory distress. She has no wheezes. She has no rales. She exhibits no tenderness.  Abdominal: Soft. Bowel sounds are normal. She exhibits no distension and no mass. There is tenderness (mild periumbilical pain). There is no rebound and no guarding.  Musculoskeletal: Normal range of motion. She exhibits no edema and no tenderness.  Lymphadenopathy:    She has no cervical adenopathy.  Neurological: She is alert and oriented to person, place, and time. She displays normal reflexes. She exhibits normal muscle tone. Coordination normal.  Skin: Skin is warm and dry. No rash noted. No erythema. No pallor.  Psychiatric: She has a normal mood and affect. Her behavior is normal. Judgment and thought content normal.    ED Course  Procedures (including critical care time) Labs Review Labs Reviewed  CBC WITH  DIFFERENTIAL - Abnormal; Notable for the following:    RBC 3.56 (*)    Hemoglobin 7.9 (*)    HCT 26.6 (*)    MCV 74.7 (*)    MCH 22.2 (*)    MCHC 29.7 (*)    RDW 16.2 (*)    Platelets 440 (*)    Neutrophils Relative % 91 (*)    Lymphocytes Relative 7 (*)    Monocytes Relative 2 (*)    Neutro Abs 8.1 (*)    Lymphs Abs 0.6 (*)    All other components within normal limits  COMPREHENSIVE METABOLIC PANEL - Abnormal; Notable for the following:    Glucose, Bld 163 (*)    Creatinine, Ser 1.16 (*)    Total Bilirubin <0.2 (*)    GFR calc non Af Amer 49 (*)    GFR calc Af Amer 56 (*)    All other components within normal limits  LIPASE, BLOOD  URINALYSIS, ROUTINE W REFLEX MICROSCOPIC    Imaging Review No results found.   EKG Interpretation None      MDM   Final diagnoses:  Non-intractable vomiting with nausea, vomiting of unspecified type  Periumbilical abdominal pain  Chronic anemia    64 yo female with periumbilical burning type pain.  Prior CT scans reviewed, pt with findings in same area concerning for inflammatory bowel disease less likely malignancy.  Pt with stable anemia, poor compliance with iron therapy.  Pt with benign exam, no focal or severe tenderness on exam.  Doubt perforation, cholelithiasis/cholecystitis.  Will treat nausea, pain.  Pt strongly encouraged to f/u with GI for further workup.   Kalman Drape, MD 11/05/13 [place]

## 2013-11-04 NOTE — ED Notes (Signed)
Pt complains of abd pain since this afternoon, pt states that she has vomited twice.

## 2013-11-05 MED ORDER — SUCRALFATE 1 G PO TABS: 1.0000 g | ORAL_TABLET | Freq: Three times a day (TID) | ORAL | Status: DC

## 2013-11-05 MED ORDER — ONDANSETRON 8 MG PO TBDP: 8.0000 mg | ORAL_TABLET | Freq: Three times a day (TID) | ORAL | Status: DC | PRN

## 2013-11-05 NOTE — Discharge Instructions (Signed)
Please follow up with gastroenterology for further evaluation of your ongoing stomach issues and inflammation seen on prior CAT scans of your abdomen.  Continue taking your protonix and iron.  Return to the ER for worsening condition or new concerning symptoms   Abdominal Pain Many things can cause abdominal pain. Usually, abdominal pain is not caused by a disease and will improve without treatment. It can often be observed and treated at home. Your health care provider will do a physical exam and possibly order blood tests and X-rays to help determine the seriousness of your pain. However, in many cases, more time must pass before a clear cause of the pain can be found. Before that point, your health care provider may not know if you need more testing or further treatment. HOME CARE INSTRUCTIONS  Monitor your abdominal pain for any changes. The following actions may help to alleviate any discomfort you are experiencing:  Only take over-the-counter or prescription medicines as directed by your health care provider.  Do not take laxatives unless directed to do so by your health care provider.  Try a clear liquid diet (broth, tea, or water) as directed by your health care provider. Slowly move to a bland diet as tolerated. SEEK MEDICAL CARE IF:  You have unexplained abdominal pain.  You have abdominal pain associated with nausea or diarrhea.  You have pain when you urinate or have a bowel movement.  You experience abdominal pain that wakes you in the night.  You have abdominal pain that is worsened or improved by eating food.  You have abdominal pain that is worsened with eating fatty foods.  You have a fever. SEEK IMMEDIATE MEDICAL CARE IF:   Your pain does not go away within 2 hours.  You keep throwing up (vomiting).  Your pain is felt only in portions of the abdomen, such as the right side or the left lower portion of the abdomen.  You pass bloody or black tarry stools. MAKE  SURE YOU:  Understand these instructions.   Will watch your condition.   Will get help right away if you are not doing well or get worse.  Document Released: 10/27/2004 Document Revised: 01/22/2013 Document Reviewed: 09/26/2012 [facility] Patient Information 2015 [place], [place]. This information is not intended to replace advice given to you by your health care provider. Make sure you discuss any questions you have with your health care provider.  Anemia, Nonspecific Anemia is a condition in which the concentration of red blood cells or hemoglobin in the blood is below normal. Hemoglobin is a substance in red blood cells that carries oxygen to the tissues of the body. Anemia results in not enough oxygen reaching these tissues.  CAUSES  Common causes of anemia include:   Excessive bleeding. Bleeding may be internal or external. This includes excessive bleeding from periods (in women) or from the intestine.   Poor nutrition.   Chronic kidney, thyroid, and liver disease.  Bone marrow disorders that decrease red blood cell production.  Cancer and treatments for cancer.  HIV, AIDS, and their treatments.  Spleen problems that increase red blood cell destruction.  Blood disorders.  Excess destruction of red blood cells due to infection, medicines, and autoimmune disorders. SIGNS AND SYMPTOMS   Minor weakness.   Dizziness.   Headache.  Palpitations.   Shortness of breath, especially with exercise.   Paleness.  Cold sensitivity.  Indigestion.  Nausea.  Difficulty sleeping.  Difficulty concentrating. Symptoms may occur suddenly or they may develop  slowly.  DIAGNOSIS  Additional blood tests are often needed. These help your health care provider determine the best treatment. Your health care provider will check your stool for blood and look for other causes of blood loss.  TREATMENT  Treatment varies depending on the cause of the anemia. Treatment can include:     Supplements of iron, vitamin H84, or folic acid.   Hormone medicines.   A blood transfusion. This may be needed if blood loss is severe.   Hospitalization. This may be needed if there is significant continual blood loss.   Dietary changes.  Spleen removal. HOME CARE INSTRUCTIONS Keep all follow-up appointments. It often takes many weeks to correct anemia, and having your health care provider check on your condition and your response to treatment is very important. SEEK IMMEDIATE MEDICAL CARE IF:   You develop extreme weakness, shortness of breath, or chest pain.   You become dizzy or have trouble concentrating.  You develop heavy vaginal bleeding.   You develop a rash.   You have bloody or black, tarry stools.   You faint.   You vomit up blood.   You vomit repeatedly.   You have abdominal pain.  You have a fever or persistent symptoms for more than 2-3 days.   You have a fever and your symptoms suddenly get worse.   You are dehydrated.  MAKE SURE YOU:  Understand these instructions.  Will watch your condition.  Will get help right away if you are not doing well or get worse. Document Released: 02/25/2004 Document Revised: 09/19/2012 Document Reviewed: 07/13/2012 [facility] Patient Information 2015 [place], [place]. This information is not intended to replace advice given to you by your health care provider. Make sure you discuss any questions you have with your health care provider.  Nausea and Vomiting Nausea means you feel sick to your stomach. Throwing up (vomiting) is a reflex where stomach contents come out of your mouth. HOME CARE   Take medicine as told by your doctor.  Do not force yourself to eat. However, you do need to drink fluids.  If you feel like eating, eat a normal diet as told by your doctor.  Eat rice, wheat, potatoes, bread, lean meats, yogurt, fruits, and vegetables.  Avoid high-fat foods.  Drink enough fluids to keep  your pee (urine) clear or pale yellow.  Ask your doctor how to replace body fluid losses (rehydrate). Signs of body fluid loss (dehydration) include:  Feeling very thirsty.  Dry lips and mouth.  Feeling dizzy.  Dark pee.  Peeing less than normal.  Feeling confused.  Fast breathing or heart rate. GET HELP RIGHT AWAY IF:   You have blood in your throw up.  You have black or bloody poop (stool).  You have a bad headache or stiff neck.  You feel confused.  You have bad belly (abdominal) pain.  You have chest pain or trouble breathing.  You do not pee at least once every 8 hours.  You have cold, clammy skin.  You keep throwing up after 24 to 48 hours.  You have a fever. MAKE SURE YOU:   Understand these instructions.  Will watch your condition.  Will get help right away if you are not doing well or get worse. Document Released: 07/06/2007 Document Revised: 04/11/2011 Document Reviewed: 06/18/2010 [facility] Patient Information 2015 [place], [place]. This information is not intended to replace advice given to you by your health care provider. Make sure you discuss any questions you have with  your health care provider.

## 2013-11-05 NOTE — ED Notes (Signed)
Pt was able to drink with out any N/V. 

## 2013-11-11 ENCOUNTER — Telehealth (HOSPITAL_BASED_OUTPATIENT_CLINIC_OR_DEPARTMENT_OTHER): Payer: Self-pay | Admitting: Emergency Medicine

## 2013-11-11 ENCOUNTER — Encounter: Payer: Self-pay | Admitting: Gastroenterology

## 2014-01-08 ENCOUNTER — Telehealth: Payer: Self-pay

## 2014-01-08 ENCOUNTER — Encounter: Payer: Self-pay | Admitting: Gastroenterology

## 2014-01-08 ENCOUNTER — Ambulatory Visit (INDEPENDENT_AMBULATORY_CARE_PROVIDER_SITE_OTHER): Payer: Medicare Other | Admitting: Gastroenterology

## 2014-01-08 ENCOUNTER — Other Ambulatory Visit (INDEPENDENT_AMBULATORY_CARE_PROVIDER_SITE_OTHER): Payer: Medicare Other

## 2014-01-08 VITALS — BP 152/80 | HR 64 | Ht 62.0 in | Wt 181.0 lb

## 2014-01-08 DIAGNOSIS — I10 Essential (primary) hypertension: Secondary | ICD-10-CM

## 2014-01-08 DIAGNOSIS — R1031 Right lower quadrant pain: Secondary | ICD-10-CM

## 2014-01-08 DIAGNOSIS — D509 Iron deficiency anemia, unspecified: Secondary | ICD-10-CM

## 2014-01-08 DIAGNOSIS — R1310 Dysphagia, unspecified: Secondary | ICD-10-CM

## 2014-01-08 DIAGNOSIS — R933 Abnormal findings on diagnostic imaging of other parts of digestive tract: Secondary | ICD-10-CM

## 2014-01-08 LAB — CBC WITH DIFFERENTIAL/PLATELET
Basophils Absolute: 0 10*3/uL (ref 0.0–0.1)
Basophils Relative: 0.3 % (ref 0.0–3.0)
EOS PCT: 0.7 % (ref 0.0–5.0)
Eosinophils Absolute: 0.1 10*3/uL (ref 0.0–0.7)
HCT: 21.8 % — CL (ref 36.0–46.0)
Hemoglobin: 6.1 g/dL — CL (ref 12.0–15.0)
LYMPHS PCT: 14.5 % (ref 12.0–46.0)
Lymphs Abs: 1.2 10*3/uL (ref 0.7–4.0)
MCHC: 28.2 g/dL — ABNORMAL LOW (ref 30.0–36.0)
MCV: 66.8 fl — ABNORMAL LOW (ref 78.0–100.0)
Monocytes Absolute: 0.6 10*3/uL (ref 0.1–1.0)
Monocytes Relative: 7.8 % (ref 3.0–12.0)
NEUTROS PCT: 76.7 % (ref 43.0–77.0)
Neutro Abs: 6.3 10*3/uL (ref 1.4–7.7)
PLATELETS: 499 10*3/uL — AB (ref 150.0–400.0)
RBC: 3.26 Mil/uL — ABNORMAL LOW (ref 3.87–5.11)
RDW: 20.5 % — ABNORMAL HIGH (ref 11.5–15.5)
WBC: 8.3 10*3/uL (ref 4.0–10.5)

## 2014-01-08 MED ORDER — PREDNISONE 20 MG PO TABS: ORAL_TABLET | ORAL | Status: DC

## 2014-01-08 MED ORDER — ONDANSETRON HCL 4 MG PO TABS: 4.0000 mg | ORAL_TABLET | Freq: Three times a day (TID) | ORAL | Status: DC | PRN

## 2014-01-08 MED ORDER — MOVIPREP 100 G PO SOLR
1.0000 | Freq: Once | ORAL | Status: DC
Start: 2014-01-08 — End: 2014-01-17

## 2014-01-08 MED ORDER — PANTOPRAZOLE SODIUM 40 MG PO TBEC: 40.0000 mg | DELAYED_RELEASE_TABLET | Freq: Every day | ORAL | Status: DC

## 2014-01-08 NOTE — Telephone Encounter (Signed)
Left message on patient's voice mail that her blood count had come back very low and that Cecille Rubin wanted to admit her to the hospital in the morning.  I told her to stay close to home to wait for a call telling her when and where to go the hospital for admission.

## 2014-01-08 NOTE — Progress Notes (Signed)
Reviewed and agree with management plan.  Yifan Auker T. Amandeep Nesmith, MD FACG 

## 2014-01-08 NOTE — Patient Instructions (Addendum)
We have sent the following medications to your pharmacy for you to pick up at your convenience: Prednisone  Your physician has recommended that you establish with a primary care physician as soon as possible.  Your physician has requested that you go to the basement for the following lab work before leaving today:  [place] have been scheduled for a Barium Esophogram at [facility] (1st floor of the hospital) on 01/16/2014 at 10:00am. Please arrive 15 minutes prior to your appointment for registration. Make certain not to have anything to eat or drink 3 hours prior to your test. If you need to reschedule for any reason, please contact radiology at [phone] to do so. __________________________________________________________________ A barium swallow is an examination that concentrates on views of the esophagus. This tends to be a double contrast exam (barium and two liquids which, when combined, create a gas to distend the wall of the oesophagus) or single contrast (non-ionic iodine based). The study is usually tailored to your symptoms so a good history is essential. Attention is paid during the study to the form, structure and configuration of the esophagus, looking for functional disorders (such as aspiration, dysphagia, achalasia, motility and reflux) EXAMINATION You may be asked to change into a gown, depending on the type of swallow being performed. A radiologist and radiographer will perform the procedure. The radiologist will advise you of the type of contrast selected for your procedure and direct you during the exam. You will be asked to stand, sit or lie in several different positions and to hold a small amount of fluid in your mouth before being asked to swallow while the imaging is performed .In some instances you may be asked to swallow barium coated marshmallows to assess the motility of a solid food bolus. The exam can be recorded as a digital or video fluoroscopy  procedure. POST PROCEDURE It will take 1-2 days for the barium to pass through your system. To facilitate this, it is important, unless otherwise directed, to increase your fluids for the next 24-48hrs and to resume your normal diet.  This test typically takes about 30 minutes to perform. __________________________________________________________________________________   [facility] have been scheduled for a colonoscopy. Please follow written instructions given to you at your visit today.  Please pick up your prep kit at the pharmacy within the next 1-3 days. If you use inhalers (even only as needed), please bring them with you on the day of your procedure. Your physician has requested that you go to www.startemmi.com and enter the access code given to you at your visit today. This web site gives a general overview about your procedure. However, you should still follow specific instructions given to you by our office regarding your preparation for the procedure.

## 2014-01-08 NOTE — Progress Notes (Signed)
Patient ID: Gail Carlson, female   DOB: August 14, 1950, 63 y.o.   MRN: [id number]    HPI:   [name] is a 63 year old female referred for evaluation of lower abdominal pain by the emergency room.  Patient is a pleasant African-American female who has a long-standing history of GERD. Initially she would use times or Rolaids with minimal relief, and about a year and a half ago was started on pantoprazole at a clinic in [place]. Since on the pantoprazole she has no heartburn, but over the past 3 months she has been having dysphagia to meats and dry foods that has been increasing in frequency. She has had multiple episodes where she's had to spit up food because she could not push it down with liquids. She has no dysphagia to liquids alone. She denies globus sensation. She has never had dysphagia prior to 3 months ago.  Consent the also has been having right lower quadrant pain for several years. After having had the pain for several months, it became severe in 2010 and she was seen in the emergency room where she was diagnosed with appendicitis and had an appendectomy. Since that time, she has continued to have intermittent right lower quadrant pain that waxes and wanes in intensity but has becoming more frequent over the past year. She has been to the emergency room on several occasions over the last few years for her abdominal pain and has had CT scans that showed ileal and cecal thickening. She has been advised in the ER over the past few years to be seen by gastroenterology. Unfortunately, the patient states she had no insurance and was unable to be seen by anyone. Her pain is associated with nausea and bloating. She rarely has diarrhea. She has intermittent bouts of constipation and will occasionally have blood on the toilet tissue if she strains. She reports that she gets "fever blisters" in her mouth especially when her abdominal pain is flaring. She has been experiencing nausea after she eats with  abdominal bloating. She recently was found to be anemic at a clinic in [place] and was advised to use iron supplements which she started several weeks ago. Her appetite has been good and her weight has been stable. She reports that she has been feeling weak and fatigued much more easily than she has in the past. She has no chest pain or shortness of breath.    Past Medical History  Diagnosis Date  . Hypertension   . Anemia     Past Surgical History  Procedure Laterality Date  . Appendectomy     Family History  Problem Relation Age of Onset  . Breast cancer Mother   . Diabetes Mother   . Diabetes Brother   . Breast cancer Maternal Aunt    History  Substance Use Topics  . Smoking status: Current Every Day Smoker -- 0.50 packs/day    Types: Cigarettes  . Smokeless tobacco: Never Used  . Alcohol Use: No     Comment: occ   Current Outpatient Prescriptions  Medication Sig Dispense Refill  . amLODipine (NORVASC) 10 MG tablet Take 10 mg by mouth daily.    . ferrous sulfate 325 (65 FE) MG tablet Take 1 tablet (325 mg total) by mouth daily with breakfast. 90 tablet 3  . pantoprazole (PROTONIX) 40 MG tablet Take 1 tablet (40 mg total) by mouth daily. 30 tablet 3  . MOVIPREP 100 G SOLR Take 1 kit (200 g total) by mouth once. 1 kit 0  .  ondansetron (ZOFRAN) 4 MG tablet Take 1 tablet (4 mg total) by mouth every 8 (eight) hours as needed for nausea or vomiting. 30 tablet 1  . predniSONE (DELTASONE) 20 MG tablet Take 20m (2 tab) for 1 week; then, 367m(1.5 tab) for 1 week; then 2075m1 tab) for 1 week, then 33m49m5mg)56mr 1 week and then stop. 40 tablet 0   No current facility-administered medications for this visit.   No Known Allergies   Review of Systems: Gen: Denies any fever, chills, sweats,  weight loss, and sleep disorder CV: Denies chest pain, angina, palpitations, syncope, orthopnea, PND, peripheral edema, and claudication. Resp: Denies dyspnea at rest, dyspnea with  exercise, cough, sputum, wheezing, coughing up blood, and pleurisy. GI: Denies vomiting blood, jaundice, and fecal incontinence.   Denies dysphagia or odynophagia. GU : Denies urinary burning, blood in urine, urinary frequency, urinary hesitancy, nocturnal urination, and urinary incontinence. MS: Denies joint pain, limitation of movement, and swelling, stiffness, low back pain, extremity pain. Denies muscle weakness, cramps, atrophy.  Derm: Denies rash, itching, dry skin, hives, moles, warts, or unhealing ulcers.  Psych: Denies depression, anxiety, memory loss, suicidal ideation, hallucinations, paranoia, and confusion. Heme: Denies bruising, bleeding, and enlarged lymph nodes. Neuro:  Denies any headaches, dizziness, paresthesias. Endo:  Denies any problems with DM, thyroid, adrenal function  Studies: Abdomen Pelvis Wo Contrast   Status: Final result       PACS Images     Show images for CT Abdomen Pelvis Wo Contrast     Study Result     CLINICAL DATA: Abdominal pain on 1 week duration. Right lower quadrant pain.  EXAM: CT ABDOMEN AND PELVIS WITHOUT CONTRAST  TECHNIQUE: Multidetector CT imaging of the abdomen and pelvis was performed following the standard protocol without IV contrast.  COMPARISON: 11/08/2012  FINDINGS: Lung bases are clear. Small amount of pericardial fluid. No pleural fluid.  The liver appears normal without contrast. No calcified gallstones. Spleen is normal. The pancreas is normal. Chronic bilateral adrenal enlargement is unchanged. The kidneys are normal without cyst, mass, stone or hydronephrosis. There is atherosclerosis of the aorta but no aneurysm. The IVC is unremarkable.  There is acute an extensive pathology in the right lower quadrant affecting the bowel. This study was done as a low dose renal stone protocol and therefore detail is somewhat limited and there is no contrast to is cyst in the interpretation. There appears to be  wall thickening and inflammation of the distal small intestine in the cecum. I can not specifically resolve the appendix. I think there are surgical clips in the region from a previous appendectomy. There is stranding of the regional fat. There is a small amount of free intraperitoneal fluid. No free intraperitoneal air is seen. No localized collection to suggest focal abscess. The appearance is worrisome for bowel inflammation, either due to infectious inflammation or inflammatory bowel disease. Neoplastic disease seems less likely.  Ordinary degenerative change affects the lumbar spine.  IMPRESSION: Acute inflammatory process in the right lower quadrant affecting the distal small intestine and the right colon. Extensive regional inflammatory change. Some free fluid. No evidence of free air or focal abscess. Findings are worrisome for infectious inflammation or inflammatory bowel disease. Neoplastic involvement/lymphoma would be less likely but theoretically possible. Scan with oral and intravenous contrast would have some advantages in delineating the degree of involvement.   Electronically Signed  By: Mark Nelson Chimes  On: 06/28/2013 11:13   n Pelvis W Contrast  Status: Final result       PACS Images     Show images for CT Abdomen Pelvis W Contrast     Study Result     CLINICAL DATA: 63 year old female with abdominal pain, nausea.  EXAM: CT ABDOMEN AND PELVIS WITH CONTRAST  TECHNIQUE: Multidetector CT imaging of the abdomen and pelvis was performed using the standard protocol following bolus administration of intravenous contrast.  CONTRAST: 164m OMNIPAQUE IOHEXOL 300 MG/ML SOLN  COMPARISON: 09/05/2008.  FINDINGS: Cardiomegaly appears increased. No pericardial effusion. Negative lung bases. No pleural effusion.  Small to moderate hiatal hernia mildly increased.  Multilevel lumbar facet degeneration. Widespread chronic  disc degeneration. No acute osseous abnormality identified.  Small volume pelvic free fluid. Negative rectum. Uterus and adnexa are within normal limits. Unremarkable bladder.  Negative sigmoid colon. Redundant but otherwise negative left colon and splenic flexure. Negative transverse colon. Oral contrast has reached the hepatic flexure which is within normal limits.  Marked circumferential wall thickening in the terminal ileum extending to the ileocecal valve and with similar Severe wall thickening involving the tip of the cecum. See coronal image 59. The involved terminal ileum segment is nearly 20 cm long. There is associated stenosis (series 2, image 60 and coronal image 51). Upstream small bowel loops are mildly dilated and contrast filled, but not inflamed.  No associated mesenteric lymphadenopathy. No other abnormal small bowel loops are identified. Distal stomach and duodenum are within normal limits.  Trace perihepatic free fluid. Otherwise negative liver and gallbladder. Portal venous system is patent. Spleen, and pancreas are within normal limits.  Stable bilateral adrenal gland thickening and or left adrenal 2 cm mass, since 2010.  There is mildly heterogeneous early renal contrast enhancement. Delayed renal excretory images appear normal. No perinephric stranding, hydronephrosis or hydroureter.  Major arterial structures in the abdomen and pelvis are patent.  IMPRESSION: 1. Severely abnormal nearly 20 cm segment of terminal ileum, ileocecal valve, and cecal tip. Pronounced circumferential wall thickening and indistinctness. Associated small volume of free fluid in the abdomen and pelvis. No associated mesenteric lymphadenopathy. No other abnormal bowel segment.  The appearance favors inflammatory bowel disease, especially Crohn disease. Query any earlier history of that entity in this patient.  Other differential considerations seem less likely  including infection (typhlitis), or neoplasm such as distal small bowel lymphoma.  2. Stable chronic left greater than right adrenal enlargement, benign.  3. Cardiomegaly, increased since 2010. Small to moderate hiatal hernia appears mildly larger.   Electronically Signed  By: LLars PinksM.D.  On: 11/08/2012 14:27   lvis W Contrast   Status: Final result       PACS Images     Show images for CT Pelvis W Contrast     Study Result     Clinical Data: Lower abdominal pain, nausea and vomiting for the past 2 days. Previous tubal ligation.  CT ABDOMEN AND PELVIS WITH CONTRAST  09/08/2008  Technique: Multidetector CT imaging of the abdomen and pelvis was performed using the standard protocol following bolus administration of intravenous contrast.  Contrast: 100 ml Omnipaque-300  Comparison: None.  CT ABDOMEN  Findings: Small hiatal hernia. 4 mm inferior right lobe liver cyst. Unremarkable spleen, pancreas, gallbladder and kidneys. Mild diffuse enlargement of both adrenal glands. There are also bilateral adrenal masses. On image number 27, the mass on the right measures 1.1 x 0.9 cm in maximum dimensions and the mass on the left measures 4.1 x 2.0 cm in maximum dimensions. No gastrointestinal  abnormalities or enlarged lymph nodes. Small amount of atelectasis or scarring at both lung bases. Lumbar and lower thoracic spine degenerative changes. Borderline enlarged heart.  IMPRESSION:  1. Small hiatal hernia. 2. Borderline cardiomegaly. 3. Bilateral adrenal masses, most likely adenomas. A follow-up noncontrast abdomen CT is recommended in 6 months.  CT PELVIS  Findings: Mildly dilated appendix with mild wall thickening and maximum diameter of 8.0 mm. Soft tissue stranding in the fat lateral to the appendix with two small, minimally prominent lymph nodes in that region. No fluid collections or free peritoneal  air. Unremarkable uterus, ovaries and urinary bladder. Wall thickening involving the terminal ileum and cecum. Unremarkable bones.  IMPRESSION: Wall thickening involving the terminal ileum, appendix and cecum with adjacent soft tissue stranding laterally. This could be due to appendicitis, infectious ileitis or inflammatory ileitis (Crohn's disease). The fact that there is gas throughout the appendix, including the tip, makes appendicitis less likely.  Provider: Alfonse Flavors       LAB RESULTS:   CBC on 11/04/2013 had a white blood cell count 8.9, hemoglobin 7.9, hematocrit 26.6, platelets 440,000.   Physical Exam: BP 152/80 mmHg  Pulse 64  Ht 5' 2" (1.575 m)  Wt 181 lb (82.101 kg)  BMI 33.10 kg/m2 Constitutional: Pleasant,well-developed,female in no acute distress. HEENT: Normocephalic and atraumatic. Conjunctivae are normal. No scleral icterus. Neck supple.  Cardiovascular: Normal rate, regular rhythm.  Pulmonary/chest: Effort normal and breath sounds normal. No wheezing, rales or rhonchi. Abdominal: Soft, nondistended, mild RLQ tenderness.. Bowel sounds active throughout. There are no masses palpable. No hepatomegaly. Extremities: no edema Lymphadenopathy: No cervical adenopathy noted. Neurological: Alert and oriented to person place and time. Skin: Skin is warm and dry. No rashes noted. Psychiatric: Normal mood and affect. Behavior is normal.  ASSESSMENT AND PLAN: #1. Dysphagia. She will be scheduled for an esophagram with barium pill to evaluate for an etiology of her dysphagia. Pending the results she will likely be scheduled for an EGD with dilation at a later date.  #2. Right lower quadrant pain with abnormal CT findings and anemia. This is suspicious for a possible inflammatory process. She will be scheduled for a colonoscopy to evaluate for polyps, neoplasia, or inflammatory bowel disease.The risks, benefits, and alternatives to colonoscopy with possible  biopsy and possible polypectomy were discussed with the patient and they consent to proceed. A CBC will be obtained today as well. Further recommendations will be made pending the findings of the above.    Hvozdovic, Vita Barley PA-C 01/08/2014, 4:30 PM

## 2014-01-09 ENCOUNTER — Telehealth: Payer: Self-pay | Admitting: Gastroenterology

## 2014-01-09 ENCOUNTER — Inpatient Hospital Stay (HOSPITAL_COMMUNITY)
Admission: AD | Admit: 2014-01-09 | Discharge: 2014-01-17 | DRG: 982 | Disposition: A | Discharge status: Home / Self Care | Payer: Medicare Other | Source: Ambulatory Visit | Attending: Internal Medicine | Admitting: Internal Medicine

## 2014-01-09 DIAGNOSIS — F17200 Nicotine dependence, unspecified, uncomplicated: Secondary | ICD-10-CM

## 2014-01-09 DIAGNOSIS — K648 Other hemorrhoids: Secondary | ICD-10-CM | POA: Diagnosis present

## 2014-01-09 DIAGNOSIS — K2100 Gastro-esophageal reflux disease with esophagitis, without bleeding: Secondary | ICD-10-CM | POA: Insufficient documentation

## 2014-01-09 DIAGNOSIS — F17210 Nicotine dependence, cigarettes, uncomplicated: Secondary | ICD-10-CM | POA: Diagnosis present

## 2014-01-09 DIAGNOSIS — K567 Ileus, unspecified: Secondary | ICD-10-CM | POA: Diagnosis not present

## 2014-01-09 DIAGNOSIS — R109 Unspecified abdominal pain: Secondary | ICD-10-CM

## 2014-01-09 DIAGNOSIS — E876 Hypokalemia: Secondary | ICD-10-CM | POA: Diagnosis not present

## 2014-01-09 DIAGNOSIS — K449 Diaphragmatic hernia without obstruction or gangrene: Secondary | ICD-10-CM | POA: Diagnosis present

## 2014-01-09 DIAGNOSIS — Z803 Family history of malignant neoplasm of breast: Secondary | ICD-10-CM

## 2014-01-09 DIAGNOSIS — K5900 Constipation, unspecified: Secondary | ICD-10-CM | POA: Diagnosis present

## 2014-01-09 DIAGNOSIS — E119 Type 2 diabetes mellitus without complications: Secondary | ICD-10-CM | POA: Diagnosis present

## 2014-01-09 DIAGNOSIS — D509 Iron deficiency anemia, unspecified: Secondary | ICD-10-CM | POA: Diagnosis present

## 2014-01-09 DIAGNOSIS — I10 Essential (primary) hypertension: Secondary | ICD-10-CM | POA: Diagnosis present

## 2014-01-09 DIAGNOSIS — D649 Anemia, unspecified: Secondary | ICD-10-CM | POA: Diagnosis present

## 2014-01-09 DIAGNOSIS — R935 Abnormal findings on diagnostic imaging of other abdominal regions, including retroperitoneum: Secondary | ICD-10-CM | POA: Insufficient documentation

## 2014-01-09 DIAGNOSIS — Z833 Family history of diabetes mellitus: Secondary | ICD-10-CM | POA: Diagnosis not present

## 2014-01-09 DIAGNOSIS — G8929 Other chronic pain: Secondary | ICD-10-CM | POA: Diagnosis present

## 2014-01-09 DIAGNOSIS — Z79899 Other long term (current) drug therapy: Secondary | ICD-10-CM | POA: Diagnosis not present

## 2014-01-09 DIAGNOSIS — C189 Malignant neoplasm of colon, unspecified: Secondary | ICD-10-CM | POA: Diagnosis present

## 2014-01-09 DIAGNOSIS — D124 Benign neoplasm of descending colon: Secondary | ICD-10-CM | POA: Diagnosis present

## 2014-01-09 DIAGNOSIS — K222 Esophageal obstruction: Secondary | ICD-10-CM | POA: Diagnosis present

## 2014-01-09 DIAGNOSIS — R1314 Dysphagia, pharyngoesophageal phase: Secondary | ICD-10-CM | POA: Diagnosis present

## 2014-01-09 DIAGNOSIS — K644 Residual hemorrhoidal skin tags: Secondary | ICD-10-CM | POA: Diagnosis present

## 2014-01-09 DIAGNOSIS — K6389 Other specified diseases of intestine: Secondary | ICD-10-CM

## 2014-01-09 DIAGNOSIS — K529 Noninfective gastroenteritis and colitis, unspecified: Secondary | ICD-10-CM | POA: Diagnosis present

## 2014-01-09 DIAGNOSIS — R1031 Right lower quadrant pain: Secondary | ICD-10-CM

## 2014-01-09 DIAGNOSIS — D500 Iron deficiency anemia secondary to blood loss (chronic): Secondary | ICD-10-CM

## 2014-01-09 DIAGNOSIS — R933 Abnormal findings on diagnostic imaging of other parts of digestive tract: Secondary | ICD-10-CM

## 2014-01-09 LAB — BASIC METABOLIC PANEL
ANION GAP: 13 (ref 5–15)
BUN: 16 mg/dL (ref 6–23)
CO2: 23 mEq/L (ref 19–32)
Calcium: 8.8 mg/dL (ref 8.4–10.5)
Chloride: 105 mEq/L (ref 96–112)
Creatinine, Ser: 1.14 mg/dL — ABNORMAL HIGH (ref 0.50–1.10)
GFR calc Af Amer: 57 mL/min — ABNORMAL LOW (ref >90)
GFR calc non Af Amer: 49 mL/min — ABNORMAL LOW (ref >90)
Glucose, Bld: 103 mg/dL — ABNORMAL HIGH (ref 70–99)
Potassium: 4 mEq/L (ref 3.7–5.3)
Sodium: 141 mEq/L (ref 137–147)

## 2014-01-09 LAB — ABO/RH: ABO/RH(D): O POS

## 2014-01-09 LAB — CBC
HEMATOCRIT: 22 % — AB (ref 36.0–46.0)
Hemoglobin: 5.8 g/dL — CL (ref 12.0–15.0)
MCH: 18.5 pg — AB (ref 26.0–34.0)
MCHC: 26.4 g/dL — AB (ref 30.0–36.0)
MCV: 70.1 fL — ABNORMAL LOW (ref 78.0–100.0)
Platelets: 352 10*3/uL (ref 150–400)
RBC: 3.14 MIL/uL — ABNORMAL LOW (ref 3.87–5.11)
RDW: 18.7 % — ABNORMAL HIGH (ref 11.5–15.5)
WBC: 5.8 10*3/uL (ref 4.0–10.5)

## 2014-01-09 LAB — IRON AND TIBC
Iron: <10 ug/dL — ABNORMAL LOW (ref 42–135)
UIBC: 355 ug/dL (ref 125–400)

## 2014-01-09 LAB — FOLATE: Folate: 12.2 ng/mL

## 2014-01-09 LAB — PREPARE RBC (CROSSMATCH)

## 2014-01-09 LAB — HEPATIC FUNCTION PANEL
ALT: <5 U/L (ref 0–35)
AST: 12 U/L (ref 0–37)
Albumin: 3.2 g/dL — ABNORMAL LOW (ref 3.5–5.2)
Alkaline Phosphatase: 89 U/L (ref 39–117)
Total Bilirubin: <0.2 mg/dL — ABNORMAL LOW (ref 0.3–1.2)
Total Protein: 6.9 g/dL (ref 6.0–8.3)

## 2014-01-09 LAB — RETICULOCYTES
RBC.: 3.01 MIL/uL — AB (ref 3.87–5.11)
RETIC COUNT ABSOLUTE: 72.2 10*3/uL (ref 19.0–186.0)
RETIC CT PCT: 2.4 % (ref 0.4–3.1)

## 2014-01-09 LAB — GLUCOSE, CAPILLARY: GLUCOSE-CAPILLARY: 104 mg/dL — AB (ref 70–99)

## 2014-01-09 LAB — VITAMIN B12: Vitamin B-12: 380 pg/mL (ref 211–911)

## 2014-01-09 LAB — FERRITIN: Ferritin: 8 ng/mL — ABNORMAL LOW (ref 10–291)

## 2014-01-09 MED ORDER — ACETAMINOPHEN 325 MG PO TABS: 650.0000 mg | ORAL_TABLET | Freq: Four times a day (QID) | ORAL | Status: DC | PRN

## 2014-01-09 MED ORDER — AMLODIPINE BESYLATE 10 MG PO TABS
10.0000 mg | ORAL_TABLET | Freq: Every day | ORAL | Status: DC
  Administered 2014-01-09 – 2014-01-17 (×8): 10 mg via ORAL
  Filled 2014-01-09 (×9): qty 1

## 2014-01-09 MED ORDER — PEG-KCL-NACL-NASULF-NA ASC-C 100 G PO SOLR
0.5000 | Freq: Two times a day (BID) | ORAL | Status: AC
  Administered 2014-01-09 (×2): 100 g via ORAL
  Filled 2014-01-09 (×2): qty 1

## 2014-01-09 MED ORDER — PANTOPRAZOLE SODIUM 40 MG PO TBEC
40.0000 mg | DELAYED_RELEASE_TABLET | Freq: Every day | ORAL | Status: DC
  Administered 2014-01-09 – 2014-01-10 (×2): 40 mg via ORAL
  Filled 2014-01-09 (×4): qty 1

## 2014-01-09 MED ORDER — NICOTINE 14 MG/24HR TD PT24
14.0000 mg | MEDICATED_PATCH | Freq: Every day | TRANSDERMAL | Status: DC
  Administered 2014-01-09 – 2014-01-17 (×8): 14 mg via TRANSDERMAL
  Filled 2014-01-09 (×9): qty 1

## 2014-01-09 MED ORDER — ONDANSETRON HCL 4 MG PO TABS: 4.0000 mg | ORAL_TABLET | Freq: Four times a day (QID) | ORAL | Status: DC | PRN

## 2014-01-09 MED ORDER — ONDANSETRON HCL 4 MG/2ML IJ SOLN
4.0000 mg | Freq: Four times a day (QID) | INTRAMUSCULAR | Status: DC | PRN
  Administered 2014-01-10: 4 mg via INTRAVENOUS
  Filled 2014-01-09: qty 2

## 2014-01-09 MED ORDER — ALBUTEROL SULFATE (2.5 MG/3ML) 0.083% IN NEBU: 2.5000 mg | INHALATION_SOLUTION | RESPIRATORY_TRACT | Status: DC | PRN

## 2014-01-09 MED ORDER — SODIUM CHLORIDE 0.9 % IV SOLN
INTRAVENOUS | Status: DC
  Administered 2014-01-09 – 2014-01-11 (×3): via INTRAVENOUS

## 2014-01-09 MED ORDER — LORAZEPAM 0.5 MG PO TABS
0.5000 mg | ORAL_TABLET | Freq: Two times a day (BID) | ORAL | Status: DC | PRN
  Administered 2014-01-09 – 2014-01-10 (×2): 0.5 mg via ORAL
  Filled 2014-01-09 (×2): qty 1

## 2014-01-09 MED ORDER — SODIUM CHLORIDE 0.9 % IV SOLN
Freq: Once | INTRAVENOUS | Status: AC
  Administered 2014-01-09: 14:00:00 via INTRAVENOUS

## 2014-01-09 MED ORDER — ACETAMINOPHEN 650 MG RE SUPP: 650.0000 mg | Freq: Four times a day (QID) | RECTAL | Status: DC | PRN

## 2014-01-09 NOTE — Progress Notes (Signed)
Pt tolerated 1st unit of blood well; no signs of transfusion reaction. 2nd unit not able to be given during my shift.  Night nurse aware that 2nd unit still needs to be given.

## 2014-01-09 NOTE — Plan of Care (Signed)
Problem: Phase I Progression Outcomes Goal: OOB as tolerated unless otherwise ordered Outcome: Completed/Met Date Met:  01/09/14     

## 2014-01-09 NOTE — Progress Notes (Signed)
CRITICAL VALUE ALERT  Critical value received:  hmg   Date of notification:  01/09/14  Time of notification:  4239  Critical value read back:Yes.    Nurse who received alert:  Wess Botts  MD notified (1st page):  Sibyl Parr   Time of first page:  1215  MD notified (2nd page):  Time of second page:  Responding MD:  Heywood Footman   Time MD responded:  1230

## 2014-01-09 NOTE — Progress Notes (Signed)
Patient ID: Gail Carlson, female   DOB: 22-Oct-1950, 63 y.o.   MRN: [id number]  [facility] Progress Note  Subjective: 63 yo female who was seen in our office yesterday as a new pt with complaints of lower abdominal pain. She also has a 3 month history of solid food dysphagia. Patient related she been having lower abdominal pain for several months in 2010. This became severe she was seen in the emergency room was diagnosed with appendicitis and then had an appendectomy. Apparently since then she has had intermittent right lower quadrant abdominal pain that is becoming more frequent and intense over the past year. She has had several different emergency room visits. CT scans show markedly  abnormal nearly 20 cm segment of the terminal ileum ileocecal valve and cecal tip with pronounced circumferential wall thickening in indistinctness . Appearance favored inflammatory bowel disease. Patient was to be scheduled for EGD and colonoscopy as an outpatient however baseline labs returned showing a hemoglobin of 6, and MCV of 66. Patient is admitted at this time to undergo transfusions and then further diagnostic evaluation with EGD and colonoscopy.  Objective:  Vital signs in last 24 hours: Temp:  [98.2 F (36.8 C)-98.4 F (36.9 C)] 98.2 F (36.8 C) (12/10 1320) Pulse Rate:  [64-74] 74 (12/10 1320) Resp:  [18] 18 (12/10 1320) BP: (141-160)/(61-80) 141/61 mmHg (12/10 1320) SpO2:  [100 %] 100 % (12/10 1320) Weight:  [181 lb (82.101 kg)] 181 lb (82.101 kg) (12/09 1433)   General:   Alert,  Wright female   in NAD Heart:  Regular rate and rhythm; no murmurs Pulm;clear Abdomen:  Soft, tender  Rather diffusely lower abd/RLQ,and nondistended. Normal bowel sounds, without guarding, and without rebound.   Extremities:  Without edema. Neurologic:  Alert and  oriented x4;  grossly normal neurologically. Psych:  Alert and cooperative. Normal mood and affect.  Intake/Output from  previous day:   Intake/Output this shift:    Lab Results:  Recent Labs  01/08/14 1549 01/09/14 1210  WBC 8.3 5.8  HGB 6.1 Repeated and verified X2.* 5.8*  HCT 21.8 Repeated and verified X2.* 22.0*  PLT 499.0* 352   BMET  Recent Labs  01/09/14 1210  NA 141  K 4.0  CL 105  CO2 23  GLUCOSE 103*  BUN 16  CREATININE 1.14*  CALCIUM 8.8   LFT  Recent Labs  01/09/14 1210  PROT 6.9  ALBUMIN 3.2*  AST 12  ALT <5  ALKPHOS 89  BILITOT <0.2*  BILIDIR PENDING  IBILI PENDING   PT/INR No results for input(s): LABPROT, INR in the last 72 hours. Hepatitis Panel No results for input(s): HEPBSAG, HCVAB, HEPAIGM, HEPBIGM in the last 72 hours.  Assessment / Plan: #55 63 year old female with profound microcytic anemia and abnormal CT scans of the abdomen and pelvis showing marked abnormality of 20 cm of the terminal ileum including the ileocecal valve and cecal tip with pronounced circumferential wall thickening. We'll need to rule out IBD/Crohn's disease versus possible neoplasm #2 mild cardiomegaly #3 diabetes mellitus #4 solid food dysphagia -rule out peptic stricture #5 hypertension #6 history of substance abuse  Plan; patient is to be transfused today Check iron studies Have scheduled for colonoscopy and EGD with possible dilation with Dr. Scarlette Shorts for tomorrow 01/10/2014. Procedures discussed in detail with the patient and she is agreeable to proceed. Principal Problem:   Anemia Active Problems:   Abdominal pain   Dysphagia, pharyngoesophageal phase   HTN (hypertension)  LOS: 0 days   [name]  01/09/2014, 1:28 PM   GI ATTENDING  History, laboratories, x-rays reviewed. Patient personally seen and examined. Agree with progress note as outlined above. Daughter in room. Patient has profound iron deficiency anemia. Also chronic GI complaints. Imaging suggests inflammatory bowel disease, Crohn's. Need to rule out neoplasia. She also has dysphagia. Plans  for transfusion today. Colonoscopy and upper endoscopy tomorrow.The nature of the procedure, as well as the risks, benefits, and alternatives were carefully and thoroughly reviewed with the patient. Ample time for discussion and questions allowed. The patient understood, was satisfied, and agreed to proceed.  Docia Chuck. Geri Seminole., M.D. [facility] Division of Gastroenterology

## 2014-01-09 NOTE — Telephone Encounter (Signed)
I spoke with the patient's daughter.  She is notified that the patient will be admitted today for blood transfusion and have endoscopic procedures tomorrow.  She is notified we will call her back with the details as soon as we can arrange a bed.

## 2014-01-09 NOTE — H&P (Signed)
Patient Demographics  Gail Carlson, is a 63 y.o. female  MRN: [id number]   DOB - 06-29-1950  Admit Date - 01/09/2014  Outpatient Primary MD for the patient is No PCP Per Patient   With History of -  Past Medical History  Diagnosis Date  . Hypertension   . Anemia       Past Surgical History  Procedure Laterality Date  . Appendectomy      in for   No chief complaint on file.    HPI  Gail Carlson  is a 63 y.o. female, with significant past medical history of GERD, hypertension, anemia ,patient is directly admitted from the lower GI for anemia and hemoglobin of 6.1, patient is not to have history of chronic abdominal pain, 2010 CT showing appendicitis status post appendectomy, multiple CTs done since showing evidence of inflammatory changes in the terminal ileum and cecum area, patient never had colonoscopy done as well, patient started to complain of dysphagia 3 month ago, history of dysphagia prior to that. Patient was seen yesterday at lower GI for her abdominal pain and dysphagia, plan was for EGD and colonoscopy as an outpatient, a basic  labs were done, which did show hemoglobin of 6.1, so [facility] is [facility] transfusion, as well further workup of her dysphagia abdominal pain, patient denies any melena, coffee-ground emesis, bright blood per rectum, she is not to have history of chronic anemia, on iron supplement, but baseline hemoglobin in the low 8.    Review of Systems    In addition to the HPI above,  No Fever-chills, No Headache, No changes with Vision or hearing, No problems swallowing food or Liquids, No Chest pain, Cough or Shortness of Breath, Complains of abdominal pain, vomiting diarrhea with constipation. No Blood in stool or Urine, No dysuria, No new skin rashes or bruises, No new joints pains-aches,  No new weakness, tingling, numbness in any extremity, No recent weight gain or loss, No polyuria,  polydypsia or polyphagia, No significant Mental Stressors.  A full 10 point Review of Systems was done, except as stated above, all other Review of Systems were negative.   Social History History  Substance Use Topics  . Smoking status: Current Every Day Smoker -- 0.50 packs/day    Types: Cigarettes  . Smokeless tobacco: Never Used  . Alcohol Use: No     Comment: occ     Family History Family History  Problem Relation Age of Onset  . Breast cancer Mother   . Diabetes Mother   . Diabetes Brother   . Breast cancer Maternal Aunt      Prior to Admission medications   Medication Sig Start Date End Date Taking? Authorizing Provider  amLODipine (NORVASC) 10 MG tablet Take 10 mg by mouth daily.    Historical Provider, MD  ferrous sulfate 325 (65 FE) MG tablet Take 1 tablet (325 mg total) by mouth daily with breakfast. 11/08/12   Evelina Bucy, MD  MOVIPREP 100 G SOLR Take 1 kit (200 g total) by mouth once. 01/08/14   Ladene Artist, MD  ondansetron (ZOFRAN) 4 MG tablet Take 1 tablet (4 mg total) by mouth every 8 (eight) hours as needed for nausea or vomiting. 01/08/14   Ladene Artist, MD  pantoprazole (PROTONIX) 40 MG tablet Take 1 tablet (40 mg total) by mouth daily. 01/08/14   Ladene Artist, MD  predniSONE (DELTASONE) 20 MG tablet Take 80m (2 tab) for  1 week; then, 54m (1.5 tab) for 1 week; then 237m(1 tab) for 1 week, then 102m.5mg26mor 1 week and then stop. 01/08/14   MalcLadene Artist    No Known Allergies  Physical Exam  Vitals  Blood pressure 160/68, pulse 73, temperature 98.4 F (36.9 C), temperature source Oral, resp. rate 18, SpO2 100 %.   1. General elderly female lying in bed in NAD,   2. Normal affect and insight, Not Suicidal or Homicidal, Awake Alert, Oriented X 3.  3. No F.N deficits, ALL C.Nerves Intact, Strength 5/5 all 4 extremities, Sensation intact all 4 extremities, Plantars down going.  4. Ears and Eyes appear Normal, Conjunctivae clear,  PERRLA. Moist Oral Mucosa.  5. Supple Neck, No JVD, No cervical lymphadenopathy appriciated, No Carotid Bruits.  6. Symmetrical Chest wall movement, Good air movement bilaterally, CTAB.  7. RRR, No Gallops, Rubs or Murmurs, No Parasternal Heave.  8. Positive Bowel Sounds, Abdomen Soft, No tenderness, No organomegaly appriciated,No rebound -guarding or rigidity.  9.  No Cyanosis, Normal Skin Turgor, No Skin Rash or Bruise.  10. Good muscle tone,  joints appear normal , no effusions, Normal ROM.  11. No Palpable Lymph Nodes in Neck or Axillae    Data Review  CBC  Recent Labs Lab 01/08/14 1549  WBC 8.3  HGB 6.1 Repeated and verified X2.*  HCT 21.8 Repeated and verified X2.*  PLT 499.0*  MCV 66.8 Repeated and verified X2.*  MCHC 28.2 Repeated and verified X2.*  RDW 20.5*  LYMPHSABS 1.2  MONOABS 0.6  EOSABS 0.1  BASOSABS 0.0   ------------------------------------------------------------------------------------------------------------------  Chemistries  No results for input(s): NA, K, CL, CO2, GLUCOSE, BUN, CREATININE, CALCIUM, MG, AST, ALT, ALKPHOS, BILITOT in the last 168 hours.  Invalid input(s): GFRCGP ------------------------------------------------------------------------------------------------------------------ estimated creatinine clearance is 48 mL/min (by C-G formula based on Cr of 1.16). ------------------------------------------------------------------------------------------------------------------ No results for input(s): TSH, T4TOTAL, T3FREE, THYROIDAB in the last 72 hours.  Invalid input(s): FREET3   Coagulation profile No results for input(s): INR, PROTIME in the last 168 hours. ------------------------------------------------------------------------------------------------------------------- No results for input(s): DDIMER in the last 72  hours. -------------------------------------------------------------------------------------------------------------------  Cardiac Enzymes No results for input(s): CKMB, TROPONINI, MYOGLOBIN in the last 168 hours.  Invalid input(s): CK ------------------------------------------------------------------------------------------------------------------ Invalid input(s): POCBNP   ---------------------------------------------------------------------------------------------------------------  Urinalysis    Component Value Date/Time   COLORURINE YELLOW 11/04/2013 2319   APPEARANCEUR CLEAR 11/04/2013 2319   LABSPEC 1.014 11/04/2013 2319   PHURINE 7.5 11/04/2013 2319   GLUCOSEU NEGATIVE 11/04/2013 2319   HGBUR NEGATIVE 11/04/2013 2319   HGBUR negative 11/05/2009 1351   BILIRUBINUR NEGATIVE 11/04/2013 2319   KETONESUR NEGATIVE 11/04/2013 2319   PROTEINUR NEGATIVE 11/04/2013 2319   UROBILINOGEN 0.2 11/04/2013 2319   NITRITE NEGATIVE 11/04/2013 2319   LEUKOCYTESUR NEGATIVE 11/04/2013 2319    ----------------------------------------------------------------------------------------------------------------  Imaging results:   No results found.     Assessment & Plan  Principal Problem:   Anemia Active Problems:   Abdominal pain   Dysphagia, pharyngoesophageal phase   HTN (hypertension)    Anemia - Patient hemoglobin is 6.1, has chronic anemia with iron supplement, but no workup done as an outpatient, no colonoscopy, no anemia panel, given her lack of PCP. -We'll check anemia panel including B-12H-65 folic acid level. -Will type and screen and transfused 2 units packed red blood cells. -She does for EGD and colonoscopy in a.m.  Dysphagia -GI were consulted, and plan is for endoscopy in a.m..  CMarland Kitchenronic abdominal pain -Patient CT  abdomen pelvis in the past showing chronic inflammatory changes in terminal ileum and cecum, the plan is for colonoscopy in a.m.    Hypertension -Continue with Norvasc  Tobacco abuse -We'll start on nicotine patch   DVT Prophylaxis SCDs   AM Labs Ordered, also please review Full Orders  Family Communication: Admission, patients condition and plan of care including tests being ordered have been discussed with the patient and daughter who indicate understanding and agree with the plan and Code Status.  Code Status full  Disposition, admit to inpatient  Condition GUARDED    Time spent in minutes : 60 minutes    Zamariya Neal M.D on 01/09/2014 at 12:03 PM  Between 7am to 7pm - Pager - [phone]  After 7pm go to [email or web address] - password [email or web address]  And look for the night coverage person covering me after hours  [facility] Office  [phone]   **Disclaimer: This note may have been dictated with voice recognition software. Similar sounding words can inadvertently be transcribed and this note may contain transcription errors which may not have been corrected upon publication of note.**

## 2014-01-09 NOTE — Telephone Encounter (Signed)
Patient's daughter is notified to take her mom to [facility] admitting

## 2014-01-10 ENCOUNTER — Encounter (HOSPITAL_COMMUNITY): Payer: Self-pay | Admitting: *Deleted

## 2014-01-10 ENCOUNTER — Telehealth: Payer: Self-pay

## 2014-01-10 ENCOUNTER — Encounter (HOSPITAL_COMMUNITY)
Admission: AD | Disposition: A | Discharge status: Home / Self Care | Payer: Self-pay | Source: Ambulatory Visit | Attending: Internal Medicine

## 2014-01-10 ENCOUNTER — Inpatient Hospital Stay (HOSPITAL_COMMUNITY): Payer: Medicare Other

## 2014-01-10 DIAGNOSIS — R933 Abnormal findings on diagnostic imaging of other parts of digestive tract: Secondary | ICD-10-CM

## 2014-01-10 DIAGNOSIS — F17200 Nicotine dependence, unspecified, uncomplicated: Secondary | ICD-10-CM

## 2014-01-10 DIAGNOSIS — K6389 Other specified diseases of intestine: Secondary | ICD-10-CM

## 2014-01-10 DIAGNOSIS — R109 Unspecified abdominal pain: Secondary | ICD-10-CM

## 2014-01-10 DIAGNOSIS — D124 Benign neoplasm of descending colon: Secondary | ICD-10-CM

## 2014-01-10 DIAGNOSIS — D509 Iron deficiency anemia, unspecified: Principal | ICD-10-CM

## 2014-01-10 DIAGNOSIS — K222 Esophageal obstruction: Secondary | ICD-10-CM

## 2014-01-10 HISTORY — PX: ESOPHAGOGASTRODUODENOSCOPY: SHX5428

## 2014-01-10 HISTORY — PX: SAVORY DILATION: SHX5439

## 2014-01-10 HISTORY — PX: COLONOSCOPY: SHX5424

## 2014-01-10 LAB — TYPE AND SCREEN
ABO/RH(D): O POS
Antibody Screen: NEGATIVE
Unit division: 0
Unit division: 0

## 2014-01-10 LAB — BASIC METABOLIC PANEL
ANION GAP: 11 (ref 5–15)
BUN: 11 mg/dL (ref 6–23)
CO2: 21 mEq/L (ref 19–32)
CREATININE: 1.16 mg/dL — AB (ref 0.50–1.10)
Calcium: 8.6 mg/dL (ref 8.4–10.5)
Chloride: 109 mEq/L (ref 96–112)
GFR calc non Af Amer: 48 mL/min — ABNORMAL LOW (ref >90)
GFR, EST AFRICAN AMERICAN: 56 mL/min — AB (ref >90)
Glucose, Bld: 108 mg/dL — ABNORMAL HIGH (ref 70–99)
Potassium: 3.7 mEq/L (ref 3.7–5.3)
Sodium: 141 mEq/L (ref 137–147)

## 2014-01-10 LAB — CBC
HEMATOCRIT: 27.7 % — AB (ref 36.0–46.0)
Hemoglobin: 8.1 g/dL — ABNORMAL LOW (ref 12.0–15.0)
MCH: 21.5 pg — ABNORMAL LOW (ref 26.0–34.0)
MCHC: 29.2 g/dL — AB (ref 30.0–36.0)
MCV: 73.7 fL — ABNORMAL LOW (ref 78.0–100.0)
Platelets: 282 10*3/uL (ref 150–400)
RBC: 3.76 MIL/uL — ABNORMAL LOW (ref 3.87–5.11)
RDW: 19.6 % — AB (ref 11.5–15.5)
WBC: 6.6 10*3/uL (ref 4.0–10.5)

## 2014-01-10 SURGERY — EGD (ESOPHAGOGASTRODUODENOSCOPY)
Anesthesia: Moderate Sedation

## 2014-01-10 MED ORDER — CHLORHEXIDINE GLUCONATE CLOTH 2 % EX PADS
6.0000 | MEDICATED_PAD | Freq: Every day | CUTANEOUS | Status: DC
  Administered 2014-01-11: 6 via TOPICAL

## 2014-01-10 MED ORDER — FENTANYL CITRATE 0.05 MG/ML IJ SOLN
INTRAMUSCULAR | Status: DC | PRN
Start: 2014-01-10 — End: 2014-01-10
  Administered 2014-01-10 (×4): 25 ug via INTRAVENOUS

## 2014-01-10 MED ORDER — MIDAZOLAM HCL 5 MG/5ML IJ SOLN
INTRAMUSCULAR | Status: DC | PRN
  Administered 2014-01-10: 1 mg via INTRAVENOUS
  Administered 2014-01-10: 2 mg via INTRAVENOUS
  Administered 2014-01-10: 1 mg via INTRAVENOUS
  Administered 2014-01-10 (×2): 2 mg via INTRAVENOUS

## 2014-01-10 MED ORDER — DEXTROSE 5 % IV SOLN
2.0000 g | INTRAVENOUS | Status: AC
  Administered 2014-01-11: 2 g via INTRAVENOUS
  Filled 2014-01-10: qty 2

## 2014-01-10 MED ORDER — IOHEXOL 300 MG/ML  SOLN
100.0000 mL | Freq: Once | INTRAMUSCULAR | Status: AC | PRN
  Administered 2014-01-10: 100 mL via INTRAVENOUS

## 2014-01-10 MED ORDER — FENTANYL CITRATE 0.05 MG/ML IJ SOLN
INTRAMUSCULAR | Status: AC
  Filled 2014-01-10: qty 2

## 2014-01-10 MED ORDER — LORAZEPAM 2 MG/ML IJ SOLN
1.0000 mg | Freq: Once | INTRAMUSCULAR | Status: AC
  Administered 2014-01-10: 1 mg via INTRAVENOUS
  Filled 2014-01-10: qty 1

## 2014-01-10 MED ORDER — LORAZEPAM 1 MG PO TABS
1.0000 mg | ORAL_TABLET | Freq: Every evening | ORAL | Status: DC | PRN
  Administered 2014-01-10 – 2014-01-13 (×3): 1 mg via ORAL
  Filled 2014-01-10 (×3): qty 1

## 2014-01-10 MED ORDER — ALVIMOPAN 12 MG PO CAPS
12.0000 mg | ORAL_CAPSULE | Freq: Once | ORAL | Status: AC
  Administered 2014-01-11: 12 mg via ORAL
  Filled 2014-01-10: qty 1

## 2014-01-10 MED ORDER — IOHEXOL 300 MG/ML  SOLN
25.0000 mL | INTRAMUSCULAR | Status: AC
  Administered 2014-01-10 (×2): 25 mL via ORAL

## 2014-01-10 MED ORDER — SODIUM CHLORIDE 0.9 % IV SOLN
125.0000 mg | Freq: Once | INTRAVENOUS | Status: AC
  Administered 2014-01-10: 125 mg via INTRAVENOUS
  Filled 2014-01-10: qty 10

## 2014-01-10 MED ORDER — MIDAZOLAM HCL 10 MG/2ML IJ SOLN
INTRAMUSCULAR | Status: AC
  Filled 2014-01-10: qty 2

## 2014-01-10 NOTE — Op Note (Signed)
[facility] [place], [address]   ENDOSCOPY PROCEDURE REPORT  PATIENT: Enriqueta, [place]  MR#: [id number] BIRTHDATE: [birth year] , 65  yrs. old GENDER: female ENDOSCOPIST: Eustace Quail, MD REFERRED BY:  [facility] PROCEDURE DATE:  01/10/2014 PROCEDURE:  EGD, with the scope dilation of distal stricture ASA CLASS:     Class II INDICATIONS:  dysphagia and iron deficiency anemia. MEDICATIONS: Fentanyl 25 mcg IV and Versed 2 mg IV TOPICAL ANESTHETIC: none  DESCRIPTION OF PROCEDURE: After the risks benefits and alternatives of the procedure were thoroughly explained, informed consent was obtained.  The [place] [id number] endoscope was introduced through the mouth and advanced to the second portion of the duodenum , Without limitations.  The instrument was slowly withdrawn as the mucosa was fully examined.  EXAM:The distal esophagus revealed multiple tight concentric rings measuring approximately 7 mm.  Endoscope was passed beyond this region with disruption/dilation of the narrowed portion.  Heme present.  Stomach was normal except for a moderate sliding hiatal hernia.  The duodenum was normal.  Retroflexed views revealed a hiatal hernia.     The scope was then withdrawn from the patient and the procedure completed.  COMPLICATIONS: There were no immediate complications.  ENDOSCOPIC IMPRESSION: 1. GERD with complex high-grade distal esophageal stricture dilated with the endoscope 2. Otherwise normal EGD  RECOMMENDATIONS: 1. PPI qam 2. Will need repeat upper endoscopy and stricture dilation as an outpatient with Dr. Fuller Plan in the next 2-4 weeks, if possible  REPEAT EXAM:  eSigned:  Eustace Quail, MD 01/10/2014 10:40 AM    XT:GGYIRSW Fuller Plan, MD and The Patient

## 2014-01-10 NOTE — Op Note (Signed)
[facility] [place], [address]   COLONOSCOPY PROCEDURE REPORT  PATIENT: Carlson, Gail  MR#: [id number] BIRTHDATE: 1950-11-29 , 53  yrs. old GENDER: female ENDOSCOPIST: Eustace Quail, MD REFERRED PQ:[facility] PROCEDURE DATE:  01/10/2014 PROCEDURE:   Colonoscopy with biopsy and Colonoscopy with snare polypectomy x 1 First Screening Colonoscopy - Avg.  risk and is 50 yrs.  old or older - No.  Prior Negative Screening - Now for repeat screening. N/A  History of Adenoma - Now for follow-up colonoscopy & has been > or = to 3 yrs.  N/A  Polyps Removed Today? Yes. ASA CLASS:   Class II INDICATIONS:iron deficiency anemia, abdominal pain, and an abnormal CT. MEDICATIONS: Fentanyl 75 mcg IV and Versed 6 mg IV  DESCRIPTION OF PROCEDURE:   After the risks benefits and alternatives of the procedure were thoroughly explained, informed consent was obtained.  The digital rectal exam revealed external hemorrhoids.   The Pentax Ped Colon [id number]  endoscope was introduced through the anus and advanced to the ascending colon. No adverse events experienced.   The quality of the prep was excellent, using MoviPrep  The instrument was then slowly withdrawn as the colon was fully examined.  COLON FINDINGS: The colonoscope was advanced to the proximal right colon where a large high-grade obstructing friable mass was encountered.  This was consistent with primary colon cancer. Multiple biopsies taken.  There was a 5 mm descending colon polyp which was removed with cold snare.  The remainder of the exam was unremarkable.  Retroflexed views revealed internal hemorrhoids. The time to cecum=4 minutes 0 seconds.  Withdrawal time=9 minutes 0 seconds.  The scope was withdrawn and the procedure completed. COMPLICATIONS: There were no immediate complications.  ENDOSCOPIC IMPRESSION: 1. High-grade right colon mass consistent with colon cancer 2. Diminutive  descending colon polyp removed with snare 3. Otherwise normal exam  RECOMMENDATIONS: 1.  Await biopsy results 2.  Upper endoscopy today (see results) 3.  Surgery consultation for right hemicolectomy  eSigned:  Eustace Quail, MD 01/10/2014 10:32 AM   cc: Lucio Edward, MD, [facility], and The Patient

## 2014-01-10 NOTE — Interval H&P Note (Signed)
History and Physical Interval Note:  01/10/2014 9:56 AM  Gail Carlson  has presented today for surgery, with the diagnosis of anemia,r/o IBD  The various methods of treatment have been discussed with the patient and family. After consideration of risks, benefits and other options for treatment, the patient has consented to  Procedure(s) with comments: ESOPHAGOGASTRODUODENOSCOPY (EGD) (N/A) COLONOSCOPY (N/A) SAVORY DILATION (N/A) - Please verify with Henrene Pastor what type of dilation he will use as a surgical intervention .  The patient's history has been reviewed, patient examined, no change in status, stable for surgery.  I have reviewed the patient's chart and labs.  Questions were answered to the patient's satisfaction.     Gail Carlson

## 2014-01-10 NOTE — Telephone Encounter (Signed)
-----   Message from Ladene Artist, MD sent at 01/09/2014  6:20 PM EST ----- Please cancel colonoscopy scheduled with me and BA esophagram. Colon and egd are scheduled with Dr Henrene Pastor as inpatient on Friday.

## 2014-01-10 NOTE — H&P (View-Only) (Signed)
Patient ID: Gail Carlson, female   DOB: 04-28-1950, 63 y.o.   MRN: [id number]  [facility] Progress Note  Subjective: 63 yo female who was seen in our office yesterday as a new pt with complaints of lower abdominal pain. She also has a 3 month history of solid food dysphagia. Patient related she been having lower abdominal pain for several months in 2010. This became severe she was seen in the emergency room was diagnosed with appendicitis and then had an appendectomy. Apparently since then she has had intermittent right lower quadrant abdominal pain that is becoming more frequent and intense over the past year. She has had several different emergency room visits. CT scans show markedly  abnormal nearly 20 cm segment of the terminal ileum ileocecal valve and cecal tip with pronounced circumferential wall thickening in indistinctness . Appearance favored inflammatory bowel disease. Patient was to be scheduled for EGD and colonoscopy as an outpatient however baseline labs returned showing a hemoglobin of 6, and MCV of 66. Patient is admitted at this time to undergo transfusions and then further diagnostic evaluation with EGD and colonoscopy.  Objective:  Vital signs in last 24 hours: Temp:  [98.2 F (36.8 C)-98.4 F (36.9 C)] 98.2 F (36.8 C) (12/10 1320) Pulse Rate:  [64-74] 74 (12/10 1320) Resp:  [18] 18 (12/10 1320) BP: (141-160)/(61-80) 141/61 mmHg (12/10 1320) SpO2:  [100 %] 100 % (12/10 1320) Weight:  [181 lb (82.101 kg)] 181 lb (82.101 kg) (12/09 1433)   General:   Alert,  [place] female   in NAD Heart:  Regular rate and rhythm; no murmurs Pulm;clear Abdomen:  Soft, tender  Rather diffusely lower abd/RLQ,and nondistended. Normal bowel sounds, without guarding, and without rebound.   Extremities:  Without edema. Neurologic:  Alert and  oriented x4;  grossly normal neurologically. Psych:  Alert and cooperative. Normal mood and affect.  Intake/Output from  previous day:   Intake/Output this shift:    Lab Results:  Recent Labs  01/08/14 1549 01/09/14 1210  WBC 8.3 5.8  HGB 6.1 Repeated and verified X2.* 5.8*  HCT 21.8 Repeated and verified X2.* 22.0*  PLT 499.0* 352   BMET  Recent Labs  01/09/14 1210  NA 141  K 4.0  CL 105  CO2 23  GLUCOSE 103*  BUN 16  CREATININE 1.14*  CALCIUM 8.8   LFT  Recent Labs  01/09/14 1210  PROT 6.9  ALBUMIN 3.2*  AST 12  ALT <5  ALKPHOS 89  BILITOT <0.2*  BILIDIR PENDING  IBILI PENDING   PT/INR No results for input(s): LABPROT, INR in the last 72 hours. Hepatitis Panel No results for input(s): HEPBSAG, HCVAB, HEPAIGM, HEPBIGM in the last 72 hours.  Assessment / Plan: #5 64 year old female with profound microcytic anemia and abnormal CT scans of the abdomen and pelvis showing marked abnormality of 20 cm of the terminal ileum including the ileocecal valve and cecal tip with pronounced circumferential wall thickening. We'll need to rule out IBD/Crohn's disease versus possible neoplasm #2 mild cardiomegaly #3 diabetes mellitus #4 solid food dysphagia -rule out peptic stricture #5 hypertension #6 history of substance abuse  Plan; patient is to be transfused today Check iron studies Have scheduled for colonoscopy and EGD with possible dilation with Dr. Scarlette Shorts for tomorrow 01/10/2014. Procedures discussed in detail with the patient and she is agreeable to proceed. Principal Problem:   Anemia Active Problems:   Abdominal pain   Dysphagia, pharyngoesophageal phase   HTN (hypertension)  LOS: 0 days   Gail Carlson  01/09/2014, 1:28 PM   GI ATTENDING  History, laboratories, x-rays reviewed. Patient personally seen and examined. Agree with progress note as outlined above. Daughter in room. Patient has profound iron deficiency anemia. Also chronic GI complaints. Imaging suggests inflammatory bowel disease, Crohn's. Need to rule out neoplasia. She also has dysphagia. Plans  for transfusion today. Colonoscopy and upper endoscopy tomorrow.The nature of the procedure, as well as the risks, benefits, and alternatives were carefully and thoroughly reviewed with the patient. Ample time for discussion and questions allowed. The patient understood, was satisfied, and agreed to proceed.  Docia Chuck. Geri Semin[place]., M.D. [facility] Division of Gastroenterology

## 2014-01-10 NOTE — Telephone Encounter (Signed)
I have cancelled the barium swallow.  She is currently only scheduled for EGD.  I will confirm that the colonoscopy is scheduled prior to cancelling January colonoscopy

## 2014-01-10 NOTE — Consult Note (Signed)
Gail Carlson 11-25-1950  [id number].   Primary Care MD: [facility] Requesting MD: Dr. Scarlette Shorts Chief Complaint/Reason for Consult: right colon mass HPI: This is a 63 yo black female who had her appendix removed by Dr. [name] in 2010.  She states she has had persistent right sided abdominal pain since that time.  Her pathology revealed lymphoid hyperplasia, but otherwise normal.  She has had chronic constipation as well as iron deficiency anemia.  She was treated with iron, which makes her constipation worse.  Occasionally, when she is really constipated, she notices some bright red blood on her toilet paper when she wipes.  She has seen her PCP who apparently diagnosed her with GERD for this persistent abdominal pain.  She occasionally has nausea and dysphagia.  She has had several follow up CT scans that all show some type of inflammatory process in the RLQ.  It was felt to possibly be secondary to some type of IBD.  She saw GI on Wednesday and was being schedule for an upper and lower scope.  She had labs checked which revealed a hgb of 6.1.  The patient was then admitted and endoscopies were completed today.  She was found to have a near obstructing lesion in her ascending colon.  She was also noted to have a severe stricture of her esophagus.  We have been asked to see the patient for further evaluation.  ROS : Please see HPI, otherwise negative  Family History  Problem Relation Age of Onset  . Breast cancer Mother   . Diabetes Mother   . Diabetes Brother   . Breast cancer Maternal Aunt     Past Medical History  Diagnosis Date  . Hypertension   . Anemia     Past Surgical History  Procedure Laterality Date  . Appendectomy      Social History:  reports that she has been smoking Cigarettes.  She has been smoking about 0.50 packs per day. She has never used smokeless tobacco. She reports that she drinks about 3.0 oz of alcohol per week. She reports that she does not use illicit  drugs.  Allergies: No Known Allergies  Medications Prior to Admission  Medication Sig Dispense Refill  . amLODipine (NORVASC) 10 MG tablet Take 10 mg by mouth daily.    . ferrous sulfate 325 (65 FE) MG tablet Take 1 tablet (325 mg total) by mouth daily with breakfast. (Patient taking differently: Take 325 mg by mouth 2 (two) times daily with a meal. ) 90 tablet 3  . ondansetron (ZOFRAN) 4 MG tablet Take 1 tablet (4 mg total) by mouth every 8 (eight) hours as needed for nausea or vomiting. 30 tablet 1  . pantoprazole (PROTONIX) 40 MG tablet Take 1 tablet (40 mg total) by mouth daily. 30 tablet 3  . predniSONE (DELTASONE) 20 MG tablet Take 12m (2 tab) for 1 week; then, 375m(1.5 tab) for 1 week; then 2075m1 tab) for 1 week, then 61m25m5mg)37mr 1 week and then stop. 40 tablet 0  . MOVIPREP 100 G SOLR Take 1 kit (200 g total) by mouth once. 1 kit 0    Blood pressure 165/85, pulse 72, temperature 98 F (36.7 C), temperature source Oral, resp. rate 16, height _0  (1.575 m), weight 180 lb 4.8 oz (81.784 kg), SpO2 100 %. Physical Exam: General: pleasant, obese black female who is laying in bed in NAD HEENT: head is normocephalic, atraumatic.  Sclera are noninjected.  PERRL.  Ears and nose without any masses or lesions.  Mouth is pink and moist Heart: regular, rate, and rhythm.  Normal s1,s2. No obvious murmurs, gallops, or rubs noted.  Palpable radial and pedal pulses bilaterally Lungs: CTAB, no wheezes, rhonchi, or rales noted.  Respiratory effort nonlabored Abd: soft, NT, ND, +BS, no masses, hernias, or organomegaly MS: all 4 extremities are symmetrical with no cyanosis, clubbing, or edema. Skin: warm and dry with no masses, lesions, or rashes Psych: A&Ox3 with an appropriate affect, occasionally drowsy from recent sedation    Results for orders placed or performed during the hospital encounter of 01/09/14 (from the past 48 hour(s))  Prepare RBC     Status: None   Collection Time:  01/09/14 12:00 PM  Result Value Ref Range   Order Confirmation ORDER PROCESSED BY BLOOD BANK   Vitamin B12     Status: None   Collection Time: 01/09/14 12:00 PM  Result Value Ref Range   Vitamin B-12 380 211 - 911 pg/mL    Comment: Performed at [facility]  Folate     Status: None   Collection Time: 01/09/14 12:00 PM  Result Value Ref Range   Folate 12.2 ng/mL    Comment: (NOTE) Reference Ranges        Deficient:       0.4 - 3.3 ng/mL        Indeterminate:   3.4 - 5.4 ng/mL        Normal:              > 5.4 ng/mL Performed at [facility]   Iron and TIBC     Status: Abnormal   Collection Time: 01/09/14 12:00 PM  Result Value Ref Range   Iron <10 (L) 42 - 135 ug/dL   TIBC Not calculated due to Iron <10. 250 - 470 ug/dL   Saturation Ratios Not calculated due to Iron <10. 20 - 55 %   UIBC 355 125 - 400 ug/dL    Comment: Performed at [facility]  Ferritin     Status: Abnormal   Collection Time: 01/09/14 12:00 PM  Result Value Ref Range   Ferritin 8 (L) 10 - 291 ng/mL    Comment: Performed at [facility]     Status: Abnormal   Collection Time: 01/09/14 12:00 PM  Result Value Ref Range   Retic Ct Pct 2.4 0.4 - 3.1 %   RBC. 3.01 (L) 3.87 - 5.11 MIL/uL   Retic Count, Manual 72.2 19.0 - 186.0 K/uL  Type and screen     Status: None   Collection Time: 01/09/14 12:10 PM  Result Value Ref Range   ABO/RH(D) O POS    Antibody Screen NEG    Sample Expiration 01/12/2014    Unit Number [id number]    Blood Component Type RED CELLS,LR    Unit division 00    Status of Unit ISSUED,FINAL    Transfusion Status OK TO TRANSFUSE    Crossmatch Result Compatible    Unit Number [id number]    Blood Component Type RED CELLS,LR    Unit division 00    Status of Unit ISSUED,FINAL    Transfusion Status OK TO TRANSFUSE    Crossmatch Result Compatible   Basic metabolic panel     Status: Abnormal   Collection Time: 01/09/14 12:10 PM  Result  Value Ref Range   Sodium 141 137 - 147 mEq/L   Potassium 4.0 3.7 - 5.3 mEq/L  Chloride 105 96 - 112 mEq/L   CO2 23 19 - 32 mEq/L   Glucose, Bld 103 (H) 70 - 99 mg/dL   BUN 16 6 - 23 mg/dL   Creatinine, Ser 1.14 (H) 0.50 - 1.10 mg/dL   Calcium 8.8 8.4 - 10.5 mg/dL   GFR calc non Af Amer 49 (L) >90 mL/min   GFR calc Af Amer 57 (L) >90 mL/min    Comment: (NOTE) The eGFR has been calculated using the CKD EPI equation. This calculation has not been validated in all clinical situations. eGFR's persistently <90 mL/min signify possible Chronic Kidney Disease.    Anion gap 13 5 - 15  Hepatic function panel     Status: Abnormal   Collection Time: 01/09/14 12:10 PM  Result Value Ref Range   Total Protein 6.9 6.0 - 8.3 g/dL   Albumin 3.2 (L) 3.5 - 5.2 g/dL   AST 12 0 - 37 U/L   ALT <5 0 - 35 U/L   Alkaline Phosphatase 89 39 - 117 U/L   Total Bilirubin <0.2 (L) 0.3 - 1.2 mg/dL   Bilirubin, Direct <0.2 0.0 - 0.3 mg/dL   Indirect Bilirubin NOT CALCULATED 0.3 - 0.9 mg/dL  CBC     Status: Abnormal   Collection Time: 01/09/14 12:10 PM  Result Value Ref Range   WBC 5.8 4.0 - 10.5 K/uL   RBC 3.14 (L) 3.87 - 5.11 MIL/uL   Hemoglobin 5.8 (LL) 12.0 - 15.0 g/dL    Comment: REPEATED TO VERIFY CRITICAL RESULT CALLED TO, READ BACK BY AND VERIFIED WITH: DUNKELBERGER,K. RN _0  ON 01/09/14 BY MCCOY,N.    HCT 22.0 (L) 36.0 - 46.0 %   MCV 70.1 (L) 78.0 - 100.0 fL   MCH 18.5 (L) 26.0 - 34.0 pg   MCHC 26.4 (L) 30.0 - 36.0 g/dL   RDW 18.7 (H) 11.5 - 15.5 %   Platelets 352 150 - 400 K/uL  ABO/Rh     Status: None   Collection Time: 01/09/14 12:10 PM  Result Value Ref Range   ABO/RH(D) O POS   Glucose, capillary     Status: Abnormal   Collection Time: 01/09/14  6:37 PM  Result Value Ref Range   Glucose-Capillary 104 (H) 70 - 99 mg/dL  Basic metabolic panel     Status: Abnormal   Collection Time: 01/10/14  4:16 AM  Result Value Ref Range   Sodium 141 137 - 147 mEq/L   Potassium 3.7 3.7 - 5.3  mEq/L   Chloride 109 96 - 112 mEq/L   CO2 21 19 - 32 mEq/L   Glucose, Bld 108 (H) 70 - 99 mg/dL   BUN 11 6 - 23 mg/dL   Creatinine, Ser 1.16 (H) 0.50 - 1.10 mg/dL   Calcium 8.6 8.4 - 10.5 mg/dL   GFR calc non Af Amer 48 (L) >90 mL/min   GFR calc Af Amer 56 (L) >90 mL/min    Comment: (NOTE) The eGFR has been calculated using the CKD EPI equation. This calculation has not been validated in all clinical situations. eGFR's persistently <90 mL/min signify possible Chronic Kidney Disease.    Anion gap 11 5 - 15  CBC     Status: Abnormal   Collection Time: 01/10/14  4:16 AM  Result Value Ref Range   WBC 6.6 4.0 - 10.5 K/uL   RBC 3.76 (L) 3.87 - 5.11 MIL/uL   Hemoglobin 8.1 (L) 12.0 - 15.0 g/dL    Comment: REPEATED TO VERIFY DELTA  CHECK NOTED POST TRANSFUSION SPECIMEN    HCT 27.7 (L) 36.0 - 46.0 %   MCV 73.7 (L) 78.0 - 100.0 fL   MCH 21.5 (L) 26.0 - 34.0 pg   MCHC 29.2 (L) 30.0 - 36.0 g/dL   RDW 19.6 (H) 11.5 - 15.5 %   Platelets 282 150 - 400 K/uL   No results found.     Assessment/Plan 1. Ascending colon mass, likely malignant 2. Anemia, secondary to #1 and iron deficiency 3. HTN 4. Esophageal stricture  Plan: 1. The patient will need staging CT of C/A/P to evaluate for metastatic disease.  Her creatinine is slightly elevated so she will have to get oral contrast only.  She will need this area at least diverted (if she has extensive metastases) vs more likely a resection.  I have explained all of this to the patient and her daughter.  The patient is still only mildly sedated and may forget some of this explanation.  I drew them a picture to better demonstrate for their understanding.  They were both very appreciative. 2. Remain on clear liquids until time of surgery. 3. Check CEA  Talisha Erby E 01/10/2014, 2:03 PM Pager: [phone]

## 2014-01-10 NOTE — Progress Notes (Signed)
PROGRESS NOTE  Gail Carlson QZR:[id number] DOB: 1950-02-28 DOA: 01/09/2014 PCP: No PCP Per Patient  Brief history 63 year old female with a history of iron deficiency anemia, GERD, hypertension presents with abdominal pain for at least the past 3 years. Patient states that she had an appendectomy approximately 4 years ago after which she began having abdominal pain and discomfort. The patient was diagnosed with iron deficiency anemia by [facility] and started on iron supplementation here she was supposed to take it 3 times a day, but has only been taking it twice a day because of GI distress and constipation. For the past 2-3 months, she has complained of worsening abdominal pain and solid food dysphagia. In addition, the patient has had intermittent nausea and vomiting with certain foods which have also worsened her abdominal discomfort. When she is constipated, she has noted intermittent hematochezia. She denied any fevers, chills, chest pain, shortness of breath, but complains of generalized fatigue, weakness.  She went to see Dr. Silvio Pate on 12/09/2013. Outpatient endoscopy and colonoscopy were planned and blood work was obtained. However her blood work showed that she had hemoglobin 6.1. As a result the patient was strictly admitted. The patient has received 2 units PRBC. Assessment/Plan: Symptomatic anemia/iron deficiency anemia/ -Patient has received 2 units PRBC -transfuse with IV iron -Restart ferrous sulfate 3 times a day for one month, then twice a day -Advised vitamin C to assist with absorption -EGD and colonoscopy are presently planned -Appreciate GI evaluation Solid food dysphagia -EGD is planned 01/11/2014 Abdominal pain -Patient states that abdominal pain has improved since colonoscopy prep with bowel movements -This leads me to believe there is a component of constipation -06/28/2013 CT abdomen and pelvis shows right lower quadrant inflammation about the distal  small intestine and right colon -May need to evaluate the gallbladder if patient is unable to tolerate her diet Tobacco abuse -Tobacco cessation discussed -NicoDerm patch Hypertension -Continue amlodipine  Family Communication:   Daughter updated at beside Disposition Plan:   Home when medically stable       Procedures/Studies:  No results found.      Subjective: Patient complains of hunger. She denies any fevers, chills, chest pain, short of breath, nausea, vomiting. She had loose stools with the bowel prep. Abdominal pain has improved.  Objective: Filed Vitals:   01/10/14 0022 01/10/14 0213 01/10/14 0559 01/10/14 0839  BP: 131/80 136/57 156/91   Pulse: 78 70 76   Temp: 98.8 F (37.1 C) 98.4 F (36.9 C) 98.2 F (36.8 C)   TempSrc: Oral Oral Oral   Resp: 20 20 20    Height:    5\' 2"  (1.575 m)  Weight:    81.784 kg (180 lb 4.8 oz)  SpO2: 100% 100% 100%     Intake/Output Summary (Last 24 hours) at 01/10/14 0857 Last data filed at 01/10/14 0841  Gross per 24 hour  Intake 1724.75 ml  Output      0 ml  Net 1724.75 ml   Weight change:  Exam:   General:  Pt is alert, follows commands appropriately, not in acute distress  HEENT: No icterus, No thrush,[place]/AT  Cardiovascular: RRR, S1/S2, no rubs, no gallops  Respiratory: CTA bilaterally, no wheezing, no crackles, no rhonchi  Abdomen: Soft/+BS, mild RUQ tender without rebound, non distended, no guarding  Extremities: No edema, No lymphangitis, No petechiae, No rashes, no synovitis  Data Reviewed: Basic Metabolic Panel:  Recent Labs Lab 01/09/14 1210 01/10/14 0416  NA 141 141  K 4.0 3.7  CL 105 109  CO2 23 21  GLUCOSE 103* 108*  BUN 16 11  CREATININE 1.14* 1.16*  CALCIUM 8.8 8.6   Liver Function Tests:  Recent Labs Lab 01/09/14 1210  AST 12  ALT <5  ALKPHOS 89  BILITOT <0.2*  PROT 6.9  ALBUMIN 3.2*   No results for input(s): LIPASE, AMYLASE in the last 168 hours. No results for  input(s): AMMONIA in the last 168 hours. CBC:  Recent Labs Lab 01/08/14 1549 01/09/14 1210 01/10/14 0416  WBC 8.3 5.8 6.6  NEUTROABS 6.3  --   --   HGB 6.1 Repeated and verified X2.* 5.8* 8.1*  HCT 21.8 Repeated and verified X2.* 22.0* 27.7*  MCV 66.8 Repeated and verified X2.* 70.1* 73.7*  PLT 499.0* 352 282   Cardiac Enzymes: No results for input(s): CKTOTAL, CKMB, CKMBINDEX, TROPONINI in the last 168 hours. BNP: Invalid input(s): POCBNP CBG:  Recent Labs Lab 01/09/14 1837  GLUCAP 104*    No results found for this or any previous visit (from the past 240 hour(s)).   Scheduled Meds: . amLODipine  10 mg Oral Daily  . nicotine  14 mg Transdermal Daily  . pantoprazole  40 mg Oral Daily   Continuous Infusions: . sodium chloride 75 mL/hr at 01/09/14 1216     Christhoper Busbee, DO  [facility] Pager [phone]  If 7PM-7AM, please contact night-coverage [email or web address] Password [email or web address] 01/10/2014, 8:57 AM   LOS: 1 day

## 2014-01-11 ENCOUNTER — Inpatient Hospital Stay (HOSPITAL_COMMUNITY): Payer: Medicare Other | Admitting: Registered Nurse

## 2014-01-11 ENCOUNTER — Encounter (HOSPITAL_COMMUNITY): Payer: Self-pay | Admitting: Registered Nurse

## 2014-01-11 ENCOUNTER — Encounter (HOSPITAL_COMMUNITY)
Admission: AD | Disposition: A | Discharge status: Home / Self Care | Payer: Self-pay | Source: Ambulatory Visit | Attending: Internal Medicine

## 2014-01-11 DIAGNOSIS — Z720 Tobacco use: Secondary | ICD-10-CM

## 2014-01-11 DIAGNOSIS — K6389 Other specified diseases of intestine: Secondary | ICD-10-CM

## 2014-01-11 DIAGNOSIS — K222 Esophageal obstruction: Secondary | ICD-10-CM

## 2014-01-11 DIAGNOSIS — C189 Malignant neoplasm of colon, unspecified: Secondary | ICD-10-CM | POA: Diagnosis present

## 2014-01-11 HISTORY — PX: PARTIAL COLECTOMY: SHX5273

## 2014-01-11 HISTORY — PX: LAPAROTOMY: SHX154

## 2014-01-11 LAB — CBC
HEMATOCRIT: 28.6 % — AB (ref 36.0–46.0)
Hemoglobin: 8.2 g/dL — ABNORMAL LOW (ref 12.0–15.0)
MCH: 21.3 pg — ABNORMAL LOW (ref 26.0–34.0)
MCHC: 28.7 g/dL — AB (ref 30.0–36.0)
MCV: 74.3 fL — ABNORMAL LOW (ref 78.0–100.0)
Platelets: 283 10*3/uL (ref 150–400)
RBC: 3.85 MIL/uL — ABNORMAL LOW (ref 3.87–5.11)
RDW: 19.7 % — AB (ref 11.5–15.5)
WBC: 6.2 10*3/uL (ref 4.0–10.5)

## 2014-01-11 LAB — BASIC METABOLIC PANEL
Anion gap: 10 (ref 5–15)
BUN: 7 mg/dL (ref 6–23)
CALCIUM: 8.7 mg/dL (ref 8.4–10.5)
CO2: 24 mEq/L (ref 19–32)
CREATININE: 1.13 mg/dL — AB (ref 0.50–1.10)
Chloride: 108 mEq/L (ref 96–112)
GFR calc non Af Amer: 50 mL/min — ABNORMAL LOW (ref >90)
GFR, EST AFRICAN AMERICAN: 58 mL/min — AB (ref >90)
Glucose, Bld: 102 mg/dL — ABNORMAL HIGH (ref 70–99)
Potassium: 3.3 mEq/L — ABNORMAL LOW (ref 3.7–5.3)
Sodium: 142 mEq/L (ref 137–147)

## 2014-01-11 LAB — MRSA PCR SCREENING: MRSA by PCR: NEGATIVE

## 2014-01-11 LAB — CEA: CEA: 1.5 ng/mL (ref 0.0–5.0)

## 2014-01-11 SURGERY — LAPAROTOMY, EXPLORATORY
Anesthesia: General | Site: Abdomen

## 2014-01-11 MED ORDER — FENTANYL CITRATE 0.05 MG/ML IJ SOLN
INTRAMUSCULAR | Status: AC
  Filled 2014-01-11: qty 5

## 2014-01-11 MED ORDER — MORPHINE SULFATE (PF) 1 MG/ML IV SOLN
INTRAVENOUS | Status: DC
  Administered 2014-01-11: 17:00:00 via INTRAVENOUS
  Administered 2014-01-11: 26.4 mg via INTRAVENOUS
  Administered 2014-01-11: 25 mg via INTRAVENOUS
  Administered 2014-01-11: 21:00:00 via INTRAVENOUS
  Administered 2014-01-11 – 2014-01-12 (×2): 19.5 mg via INTRAVENOUS
  Administered 2014-01-12: 02:00:00 via INTRAVENOUS
  Administered 2014-01-12: 9 mg via INTRAVENOUS
  Administered 2014-01-12: 6 mg via INTRAVENOUS
  Administered 2014-01-12: 3 mg via INTRAVENOUS
  Administered 2014-01-12: 18 mg via INTRAVENOUS
  Administered 2014-01-12: 15 mg via INTRAVENOUS
  Administered 2014-01-12: 12:00:00 via INTRAVENOUS
  Administered 2014-01-13: 9 mg via INTRAVENOUS
  Administered 2014-01-13 (×2): 10.5 mg via INTRAVENOUS
  Administered 2014-01-13: 3 mg via INTRAVENOUS
  Administered 2014-01-13 (×2): via INTRAVENOUS
  Administered 2014-01-14: 4.5 mg via INTRAVENOUS
  Administered 2014-01-14: 1.5 mg via INTRAVENOUS
  Administered 2014-01-14 (×2): 3 mg via INTRAVENOUS
  Administered 2014-01-14: 6 mg via INTRAVENOUS
  Administered 2014-01-14: 3 mg via INTRAVENOUS
  Administered 2014-01-15: 4.5 mg via INTRAVENOUS
  Administered 2014-01-15: 3 mg via INTRAVENOUS
  Administered 2014-01-15: 7.5 mg via INTRAVENOUS
  Administered 2014-01-15: 6 mg via INTRAVENOUS
  Administered 2014-01-15: 7.5 mg via INTRAVENOUS
  Administered 2014-01-15: 15:00:00 via INTRAVENOUS
  Administered 2014-01-15 (×2): 3 mg via INTRAVENOUS
  Administered 2014-01-16: 3 mL via INTRAVENOUS
  Administered 2014-01-16: 3 mg via INTRAVENOUS
  Filled 2014-01-11 (×9): qty 25

## 2014-01-11 MED ORDER — LACTATED RINGERS IV SOLN: INTRAVENOUS | Status: DC

## 2014-01-11 MED ORDER — DEXAMETHASONE SODIUM PHOSPHATE 10 MG/ML IJ SOLN
INTRAMUSCULAR | Status: DC | PRN
  Administered 2014-01-11: 10 mg via INTRAVENOUS

## 2014-01-11 MED ORDER — SUCCINYLCHOLINE CHLORIDE 20 MG/ML IJ SOLN
INTRAMUSCULAR | Status: DC | PRN
  Administered 2014-01-11: 100 mg via INTRAVENOUS

## 2014-01-11 MED ORDER — ONDANSETRON HCL 4 MG/2ML IJ SOLN
INTRAMUSCULAR | Status: DC | PRN
  Administered 2014-01-11: 4 mg via INTRAVENOUS

## 2014-01-11 MED ORDER — PROPOFOL 10 MG/ML IV BOLUS
INTRAVENOUS | Status: AC
  Filled 2014-01-11: qty 20

## 2014-01-11 MED ORDER — PROPOFOL 10 MG/ML IV BOLUS
INTRAVENOUS | Status: DC | PRN
  Administered 2014-01-11: 150 mg via INTRAVENOUS

## 2014-01-11 MED ORDER — HYDROMORPHONE HCL 1 MG/ML IJ SOLN
0.2500 mg | INTRAMUSCULAR | Status: DC | PRN
  Administered 2014-01-11 (×4): 0.5 mg via INTRAVENOUS

## 2014-01-11 MED ORDER — ONDANSETRON HCL 4 MG/2ML IJ SOLN
INTRAMUSCULAR | Status: AC
  Filled 2014-01-11: qty 2

## 2014-01-11 MED ORDER — NALOXONE HCL 0.4 MG/ML IJ SOLN: 0.4000 mg | INTRAMUSCULAR | Status: DC | PRN

## 2014-01-11 MED ORDER — HYDROMORPHONE HCL 1 MG/ML IJ SOLN
INTRAMUSCULAR | Status: DC | PRN
  Administered 2014-01-11 (×3): 0.5 mg via INTRAVENOUS

## 2014-01-11 MED ORDER — GLYCOPYRROLATE 0.2 MG/ML IJ SOLN
INTRAMUSCULAR | Status: DC | PRN
  Administered 2014-01-11: .6 mg via INTRAVENOUS

## 2014-01-11 MED ORDER — ONDANSETRON HCL 4 MG/2ML IJ SOLN
4.0000 mg | Freq: Four times a day (QID) | INTRAMUSCULAR | Status: DC | PRN
  Administered 2014-01-13: 4 mg via INTRAVENOUS
  Filled 2014-01-11 (×2): qty 2

## 2014-01-11 MED ORDER — DIPHENHYDRAMINE HCL 50 MG/ML IJ SOLN
12.5000 mg | Freq: Four times a day (QID) | INTRAMUSCULAR | Status: DC | PRN
  Administered 2014-01-12: 12.5 mg via INTRAVENOUS
  Filled 2014-01-11: qty 1

## 2014-01-11 MED ORDER — HYDROMORPHONE HCL 1 MG/ML IJ SOLN
INTRAMUSCULAR | Status: AC
  Filled 2014-01-11: qty 1

## 2014-01-11 MED ORDER — LIDOCAINE HCL (CARDIAC) 20 MG/ML IV SOLN
INTRAVENOUS | Status: AC
  Filled 2014-01-11: qty 5

## 2014-01-11 MED ORDER — 0.9 % SODIUM CHLORIDE (POUR BTL) OPTIME
TOPICAL | Status: DC | PRN
  Administered 2014-01-11 (×3): 1000 mL

## 2014-01-11 MED ORDER — DEXTROSE 5 % IV SOLN
INTRAVENOUS | Status: AC
  Filled 2014-01-11: qty 2

## 2014-01-11 MED ORDER — SODIUM CHLORIDE 0.9 % IJ SOLN: 9.0000 mL | INTRAMUSCULAR | Status: DC | PRN

## 2014-01-11 MED ORDER — DIPHENHYDRAMINE HCL 12.5 MG/5ML PO ELIX: 12.5000 mg | ORAL_SOLUTION | Freq: Four times a day (QID) | ORAL | Status: DC | PRN

## 2014-01-11 MED ORDER — ROCURONIUM BROMIDE 100 MG/10ML IV SOLN
INTRAVENOUS | Status: DC | PRN
  Administered 2014-01-11: 40 mg via INTRAVENOUS

## 2014-01-11 MED ORDER — LIDOCAINE HCL (CARDIAC) 20 MG/ML IV SOLN
INTRAVENOUS | Status: DC | PRN
  Administered 2014-01-11: 100 mg via INTRAVENOUS

## 2014-01-11 MED ORDER — MIDAZOLAM HCL 5 MG/5ML IJ SOLN
INTRAMUSCULAR | Status: DC | PRN
  Administered 2014-01-11: 2 mg via INTRAVENOUS

## 2014-01-11 MED ORDER — GLYCOPYRROLATE 0.2 MG/ML IJ SOLN
INTRAMUSCULAR | Status: AC
  Filled 2014-01-11: qty 3

## 2014-01-11 MED ORDER — FENTANYL CITRATE 0.05 MG/ML IJ SOLN
INTRAMUSCULAR | Status: DC | PRN
  Administered 2014-01-11 (×2): 100 ug via INTRAVENOUS
  Administered 2014-01-11: 50 ug via INTRAVENOUS

## 2014-01-11 MED ORDER — KCL IN DEXTROSE-NACL 20-5-0.45 MEQ/L-%-% IV SOLN
INTRAVENOUS | Status: DC
  Administered 2014-01-11 – 2014-01-15 (×6): via INTRAVENOUS
  Filled 2014-01-11 (×10): qty 1000

## 2014-01-11 MED ORDER — DEXAMETHASONE SODIUM PHOSPHATE 10 MG/ML IJ SOLN
INTRAMUSCULAR | Status: AC
  Filled 2014-01-11: qty 1

## 2014-01-11 MED ORDER — HYDROMORPHONE HCL 1 MG/ML IJ SOLN: 0.2500 mg | INTRAMUSCULAR | Status: DC | PRN

## 2014-01-11 MED ORDER — KCL IN DEXTROSE-NACL 20-5-0.45 MEQ/L-%-% IV SOLN
INTRAVENOUS | Status: AC
  Filled 2014-01-11: qty 1000

## 2014-01-11 MED ORDER — ONDANSETRON HCL 4 MG PO TABS
4.0000 mg | ORAL_TABLET | Freq: Four times a day (QID) | ORAL | Status: DC | PRN
  Administered 2014-01-17: 4 mg via ORAL
  Filled 2014-01-11: qty 1

## 2014-01-11 MED ORDER — HYDROMORPHONE HCL 2 MG/ML IJ SOLN
INTRAMUSCULAR | Status: AC
  Filled 2014-01-11: qty 1

## 2014-01-11 MED ORDER — MIDAZOLAM HCL 2 MG/2ML IJ SOLN
INTRAMUSCULAR | Status: AC
  Filled 2014-01-11: qty 2

## 2014-01-11 MED ORDER — ONDANSETRON HCL 4 MG/2ML IJ SOLN
4.0000 mg | Freq: Four times a day (QID) | INTRAMUSCULAR | Status: DC | PRN
  Administered 2014-01-12: 4 mg via INTRAVENOUS

## 2014-01-11 MED ORDER — LACTATED RINGERS IV SOLN
INTRAVENOUS | Status: DC | PRN
  Administered 2014-01-11: 10:00:00 via INTRAVENOUS

## 2014-01-11 MED ORDER — NEOSTIGMINE METHYLSULFATE 10 MG/10ML IV SOLN
INTRAVENOUS | Status: DC | PRN
  Administered 2014-01-11: 4 mg via INTRAVENOUS

## 2014-01-11 MED ORDER — ENOXAPARIN SODIUM 40 MG/0.4ML ~~LOC~~ SOLN
40.0000 mg | SUBCUTANEOUS | Status: DC
  Administered 2014-01-12 – 2014-01-17 (×6): 40 mg via SUBCUTANEOUS
  Filled 2014-01-11 (×6): qty 0.4

## 2014-01-11 SURGICAL SUPPLY — 60 items
APPLICATOR COTTON TIP 6IN STRL (MISCELLANEOUS) ×3 IMPLANT
BLADE EXTENDED COATED 6.5IN (ELECTRODE) ×2 IMPLANT
BLADE HEX COATED 2.75 (ELECTRODE) ×6 IMPLANT
BLADE SURG SZ10 CARB STEEL (BLADE) IMPLANT
CANISTER SUCT 3000ML (MISCELLANEOUS) ×1 IMPLANT
CHLORAPREP W/TINT 26ML (MISCELLANEOUS) ×3 IMPLANT
COVER MAYO STAND STRL (DRAPES) ×6 IMPLANT
DRAPE LAPAROSCOPIC ABDOMINAL (DRAPES) ×3 IMPLANT
DRAPE SHEET LG 3/4 BI-LAMINATE (DRAPES) ×3 IMPLANT
DRAPE UTILITY XL STRL (DRAPES) ×6 IMPLANT
DRAPE WARM FLUID 44X44 (DRAPE) ×3 IMPLANT
DRSG OPSITE POSTOP 4X10 (GAUZE/BANDAGES/DRESSINGS) IMPLANT
DRSG OPSITE POSTOP 4X6 (GAUZE/BANDAGES/DRESSINGS) IMPLANT
DRSG OPSITE POSTOP 4X8 (GAUZE/BANDAGES/DRESSINGS) ×2 IMPLANT
DRSG PAD ABDOMINAL 8X10 ST (GAUZE/BANDAGES/DRESSINGS) IMPLANT
ELECT REM PT RETURN 9FT ADLT (ELECTROSURGICAL) ×3
ELECTRODE REM PT RTRN 9FT ADLT (ELECTROSURGICAL) ×1 IMPLANT
GAUZE SPONGE 4X4 12PLY STRL (GAUZE/BANDAGES/DRESSINGS) ×3 IMPLANT
GLOVE BIOGEL PI IND STRL 7.0 (GLOVE) ×1 IMPLANT
GLOVE BIOGEL PI INDICATOR 7.0 (GLOVE) ×2
GLOVE SURG ORTHO 8.0 STRL STRW (GLOVE) ×6 IMPLANT
GOWN STRL REUS W/TWL LRG LVL3 (GOWN DISPOSABLE) ×3 IMPLANT
GOWN STRL REUS W/TWL XL LVL3 (GOWN DISPOSABLE) ×12 IMPLANT
KIT BASIN OR (CUSTOM PROCEDURE TRAY) ×3 IMPLANT
LEGGING LITHOTOMY PAIR STRL (DRAPES) ×1 IMPLANT
LIGASURE IMPACT 36 18CM CVD LR (INSTRUMENTS) IMPLANT
LUBRICANT JELLY K Y 4OZ (MISCELLANEOUS) IMPLANT
NS IRRIG 1000ML POUR BTL (IV SOLUTION) ×3 IMPLANT
PACK GENERAL/GYN (CUSTOM PROCEDURE TRAY) ×3 IMPLANT
PENCIL BUTTON HOLSTER BLD 10FT (ELECTRODE) ×3 IMPLANT
RELOAD PROXIMATE 75MM BLUE (ENDOMECHANICALS) ×6 IMPLANT
RELOAD STAPLE 75 3.8 BLU REG (ENDOMECHANICALS) IMPLANT
SPONGE LAP 18X18 X RAY DECT (DISPOSABLE) ×3 IMPLANT
STAPLER GUN LINEAR PROX 60 (STAPLE) ×2 IMPLANT
STAPLER PROXIMATE 75MM BLUE (STAPLE) ×2 IMPLANT
STAPLER VISISTAT 35W (STAPLE) ×3 IMPLANT
SUCTION POOLE TIP (SUCTIONS) ×3 IMPLANT
SUT NOV 1 T60/GS (SUTURE) IMPLANT
SUT NOVA 1 T20/GS 25DT (SUTURE) ×10 IMPLANT
SUT NOVA NAB DX-16 0-1 5-0 T12 (SUTURE) IMPLANT
SUT NOVA T20/GS 25 (SUTURE) IMPLANT
SUT PDS AB 1 TP1 96 (SUTURE) IMPLANT
SUT SILK 2 0 (SUTURE) ×6
SUT SILK 2 0 SH CR/8 (SUTURE) ×5 IMPLANT
SUT SILK 2 0SH CR/8 30 (SUTURE) IMPLANT
SUT SILK 2-0 18XBRD TIE 12 (SUTURE) ×1 IMPLANT
SUT SILK 2-0 30XBRD TIE 12 (SUTURE) ×1 IMPLANT
SUT SILK 3 0 (SUTURE) ×3
SUT SILK 3 0 SH CR/8 (SUTURE) ×5 IMPLANT
SUT SILK 3-0 18XBRD TIE 12 (SUTURE) ×1 IMPLANT
SUT VICRYL 2 0 18  UND BR (SUTURE) ×2
SUT VICRYL 2 0 18 UND BR (SUTURE) IMPLANT
TOWEL OR 17X26 10 PK STRL BLUE (TOWEL DISPOSABLE) ×6 IMPLANT
TOWEL OR NON WOVEN STRL DISP B (DISPOSABLE) ×6 IMPLANT
TRAY FOLEY CATH 14FRSI W/METER (CATHETERS) ×3 IMPLANT
TUBING CONNECTING 10 (TUBING) IMPLANT
TUBING CONNECTING 10' (TUBING)
WATER STERILE IRR 1500ML POUR (IV SOLUTION) ×1 IMPLANT
YANKAUER SUCT BULB TIP 10FT TU (MISCELLANEOUS) ×3 IMPLANT
YANKAUER SUCT BULB TIP NO VENT (SUCTIONS) ×2 IMPLANT

## 2014-01-11 NOTE — Anesthesia Postprocedure Evaluation (Signed)
  Anesthesia Post-op Note  Patient: Gail Carlson  Procedure(s) Performed: Procedure(s) (LRB): EXPLORATORY LAPAROTOMY (N/A) PARTIAL COLECTOMY (N/A)  Patient Location: PACU  Anesthesia Type: General  Level of Consciousness: awake and alert   Airway and Oxygen Therapy: Patient Spontanous Breathing  Post-op Pain: mild  Post-op Assessment: Post-op Vital signs reviewed, Patient's Cardiovascular Status Stable, Respiratory Function Stable, Patent Airway and No signs of Nausea or vomiting  Last Vitals:  Filed Vitals:   01/11/14 1257  BP: 163/74  Pulse: 72  Temp: 36.5 C  Resp: 16    Post-op Vital Signs: stable   Complications: No apparent anesthesia complications

## 2014-01-11 NOTE — Anesthesia Preprocedure Evaluation (Addendum)
Anesthesia Evaluation  Patient identified by MRN, date of birth, ID band Patient awake    Reviewed: Allergy & Precautions, H&P , NPO status , Patient's Chart, lab work & pertinent test results  Airway Mallampati: II  TM Distance: >3 FB Neck ROM: full    Dental no notable dental hx.    Pulmonary Current Smoker,  breath sounds clear to auscultation  Pulmonary exam normal       Cardiovascular Exercise Tolerance: Good hypertension, Pt. on medications Rhythm:regular Rate:Normal     Neuro/Psych negative neurological ROS  negative psych ROS   GI/Hepatic negative GI ROS, Neg liver ROS, GERD-  Medicated and Controlled,  Endo/Other  negative endocrine ROS  Renal/GU negative Renal ROS  negative genitourinary   Musculoskeletal   Abdominal   Peds  Hematology negative hematology ROS (+) anemia , hgb 8.2   Anesthesia Other Findings   Reproductive/Obstetrics negative OB ROS                           Anesthesia Physical Anesthesia Plan  ASA: III  Anesthesia Plan: General   Post-op Pain Management:    Induction: Intravenous  Airway Management Planned: Oral ETT  Additional Equipment:   Intra-op Plan:   Post-operative Plan: Extubation in OR  Informed Consent: I have reviewed the patients History and Physical, chart, labs and discussed the procedure including the risks, benefits and alternatives for the proposed anesthesia with the patient or authorized representative who has indicated his/her understanding and acceptance.   Dental Advisory Given  Plan Discussed with: CRNA and Surgeon  Anesthesia Plan Comments:         Anesthesia Quick Evaluation

## 2014-01-11 NOTE — Op Note (Signed)
NAMERUSSIE, GULLEDGE              ACCOUNT NO.:  [id number]  MEDICAL RECORD NO.:  [id number]  LOCATION:  [facility]                         FACILITY:  [facility]  PHYSICIAN:  Earnstine Regal, MD      DATE OF BIRTH:  09-15-1950  DATE OF PROCEDURE:  01/11/2014                              OPERATIVE REPORT   PREOPERATIVE DIAGNOSIS:  Cecal mass with near obstruction.  POSTOPERATIVE DIAGNOSIS:  Cecal mass with near obstruction.  PROCEDURE:  Right colectomy.  SURGEON:  Earnstine Regal, MD, FACS  ASSISTANT:  Imogene Burn. Georgette Dover, MD, FACS  ANESTHESIA:  General per Dr. Rod Mae.  ESTIMATED BLOOD LOSS:  Less than 100 mL.  PREPARATION:  ChloraPrep.  COMPLICATIONS:  None.  INDICATIONS:  Patient is a 63 year old female admitted with anemia. Colonoscopy reveals a near obstructing mass in the cecum consistent with adenocarcinoma.  The patient was prepared and brought to the operating room for right colectomy.  BODY OF REPORT:  Procedure was done in OR #1 at the [facility].  The patient was brought to the operating room, placed in a supine position on the operating room table.  Following administration of general anesthesia, the patient was positioned and then prepped and draped in the usual aseptic fashion.  After ascertaining that an adequate level of anesthesia had been achieved, midline abdominal incision was made with a #10 blade.  Dissection was carried through subcutaneous tissue and previous scar tissue down to the fascia.  Fascia was incised in the midline and the peritoneal cavity was entered cautiously.  Adhesions of the omentum to the anterior abdominal wall were taken down with the electrocautery used for hemostasis. Balfour retractors placed for exposure.  Adhesions in the right lower quadrant were taken down sharply with the Metzenbaum scissors.  There was a large palpable mass in the cecum. Appendix was surgically absent.  The distal ileum adhesions were  incised with the Metzenbaum scissors allowing for mobilization of the terminal ileum.  The peritoneal attachments around the cecum and along the right colic gutter were incised with the electrocautery allowing for mobilization of the cecum.  The entire right colon was mobilized from its lateral peritoneal attachments.  There were adhesions of the omentum to the undersurface of the liver which were taken down with the electrocautery.  The hepatic flexure, the colon was then mobilized dividing the peritoneal layers with the LigaSure.  Dissection was carried over to the mid transverse colon.  Remainder of the peritoneal reflection along the mesentery was divided with the electrocautery allowing for complete mobilization of the right colon.  Next, a point in the terminal ileum approximately 15 cm from the ileocecal valve was selected and transected with a GIA stapler. Mesentery was taken down with the LigaSure.  The point in the proximal transverse colon was selected and transected with a GIA stapler. Mesentery was divided with the LigaSure.  Right colic vessels were identified and divided between Kelly clamps and ligated with 2-0 silk ties.  There are prominent lymph nodes within the mid right mesentery. Dissection was carried below the level of these lymph nodes taking care to avoid the sweep of the duodenum.  Vessels were divided between  Kelly clamps and ligated with 2-0 silk ties.  Remainder of the mesenteric dissection was performed with the LigaSure and the entire right colon was removed and submitted to Pathology for review.  Good hemostasis was noted along the mesenteric resection.  Next, a side- to-side functionally end-to-end anastomosis was created between the terminal ileum and the proximal transverse colon.  This was performed with a GIA stapler.  Staple line was inspected for hemostasis. Enterotomy was closed with TA60 stapler.  Mesenteric defect was closed with interrupted  2-0 silk sutures.  At this point, gowns and gloves were changed.  The entire instrument set and operative field were reset and redraped.  Next, the abdomen was irrigated copiously with warm saline which was evacuated.  All packs were removed from the peritoneal cavity.  Good hemostasis was noted. Omentum was used to cover the anastomosis and to lay over the small bowel beneath the midline incision.  Fascial incision was closed with interrupted #1 Novafil simple sutures.  Subcutaneous tissues were irrigated.  Skin was closed with stainless steel staples.  A honey-comb dressing was applied.  The patient was awakened from anesthesia and brought to the recovery room.  The patient tolerated the procedure well.   Earnstine Regal, MD, [facility] Office: [phone]    TMG/MEDQ  D:  01/11/2014  T:  01/11/2014  Job:  [id number]

## 2014-01-11 NOTE — Progress Notes (Signed)
PROGRESS NOTE  Gail Carlson XFG:[id number] DOB: [birth year] DOA: 01/09/2014 PCP: No PCP Per Patient  Brief history 63 year old female with a history of iron deficiency anemia, GERD, hypertension presents with abdominal pain for at least the past 3 years. Patient states that she had an appendectomy approximately 4 years ago after which she began having abdominal pain and discomfort. The patient was diagnosed with iron deficiency anemia by [facility] and started on iron supplementation here she was supposed to take it 3 times a day, but has only been taking it twice a day because of GI distress and constipation. For the past 2-3 months, she has complained of worsening abdominal pain and solid food dysphagia. In addition, the patient has had intermittent nausea and vomiting with certain foods which have also worsened her abdominal discomfort. When she is constipated, she has noted intermittent hematochezia.  She went to see Dr. Silvio Pate on 12/09/2013. Outpatient endoscopy and colonoscopy were planned and blood work was obtained. However her blood work showed that she had hemoglobin 6.1. As a result the patient was strictly admitted. The patient has received 2 units PRBC.  Colonoscopy 01/10/2014 revealed high-grade right colon mass. EGD revealed high-grade distal esophageal stricture that was dilated. Gen. surgery was consulted. CT of the chest, abdomen, and pelvis did not show any distant metastasis. The patient was taken to surgery on 01/11/2014 and a right hemicolectomy was performed. Assessment/Plan: Symptomatic anemia/iron deficiency anemia/ -Patient has received 2 units PRBC -transfuse with IV iron -Restart ferrous sulfate 3 times a day for one month, then twice a day -Advised vitamin C to assist with absorption -EGD= high-grade distal esophageal stricture dilated -Colonoscopy--high-grade right colon mass -Appreciate GI evaluation Solid food dysphagia -EGD is planned  01/11/2014 Right colon/cecal mass -Appreciate general surgery -01/11/2014--right hemicolectomy Abdominal pain -Patient states that abdominal pain has improved since colonoscopy prep with bowel movements -there is a component of constipation Tobacco abuse -Tobacco cessation discussed -NicoDerm patch Hypertension -Continue amlodipine Hypokalemia -Replete -Check magnesium Family Communication: Daughter updated at beside Disposition Plan: Home when medically stable    Procedures/Studies: Ct Chest W Contrast  01/10/2014   CLINICAL DATA:  63 year old female with cecal mass recently noted on colonoscopy. Evaluate for metastatic disease.  EXAM: CT CHEST, ABDOMEN, AND PELVIS WITH CONTRAST  TECHNIQUE: Multidetector CT imaging of the chest, abdomen and pelvis was performed following the standard protocol during bolus administration of intravenous contrast.  CONTRAST:  166mL OMNIPAQUE IOHEXOL 300 MG/ML  SOLN  COMPARISON:  CT of the abdomen and pelvis 06/28/2013.  FINDINGS: CT CHEST FINDINGS  Mediastinum: Heart size is mildly enlarged. There is no significant pericardial fluid, thickening or pericardial calcification. No pathologically enlarged mediastinal or hilar lymph nodes. Small hiatal hernia.  Lungs/Pleura: A few scattered 1-2 mm pulmonary nodules are seen throughout the lungs bilaterally, predominantly in the periphery in a peribronchovascular distribution, favored to represent areas of mucoid impaction within terminal bronchioles. No larger more suspicious appearing pulmonary nodules or masses are otherwise noted. No acute consolidative airspace disease. No pleural effusions.  Musculoskeletal: There are no aggressive appearing lytic or blastic lesions noted in the visualized portions of the skeleton.  CT ABDOMEN AND PELVIS FINDINGS  Hepatobiliary: There are 3 tiny low-attenuation liver lesions, largest of which is a 7 mm low attenuation lesion in segment 4A. No other suspicious appearing  hepatic lesions. No intrahepatic or extrahepatic biliary ductal dilatation. Gallbladder is normal in appearance.  Pancreas: Unremarkable.  Spleen: Sub cm  low-attenuation lesions in the spleen are incompletely characterized. However, 1 of these normalizes on the delayed phase images, and is presumably related to early perfusion (the other lesion was incompletely visualized, but similar appearance, also favored to be related to perfusion).  Adrenals/Urinary Tract: Diffuse thickening of the adrenal glands, similar to prior examinations, presumably adenomatous hyperplasia. Bilateral kidneys are normal in appearance. No hydroureteronephrosis. Urinary bladder is normal in appearance.  Stomach/Bowel: The appearance of the stomach is normal. No pathologic dilatation of small bowel or colon. There is extensive masslike thickening of the colonic wall in the cecum, which extends through the ileocecal valve into the distal ileum. Notably, given the marked wall thickening in this region, the lumen appears relatively preserved. Image 84 of series 2 demonstrates slight haziness in the pericolonic fat lateral to the cecum. Several borderline enlarged and mildly enlarged ileocolic lymph nodes are noted measuring up to 11 mm.  Vascular/Lymphatic: Mild atherosclerotic disease is noted throughout the abdominal and pelvic vasculature, without definite aneurysm. Several borderline enlarged and mildly enlarged ileocolic lymph nodes measuring up to 11 mm are noted. No other definite lymphadenopathy identified in the abdomen or pelvis.  Reproductive: Uterus and ovaries are unremarkable in appearance.  Other: No significant volume of ascites.  No pneumoperitoneum.  Musculoskeletal: There are no aggressive appearing lytic or blastic lesions noted in the visualized portions of the skeleton.  IMPRESSION: 1. Masslike thickening of the cecum which appears to go retrograde through the ileocecal valve into the distal ileum. This has been seen to  some extent or another on prior examinations dating back to 09/05/2008. This remains concerning for neoplasm (potentially either a colonic adenocarcinoma or lymphoma), however, it is notable for relatively slow growth. Today's examination demonstrates some haziness in the pericolonic fat lateral to the lesion, and some borderline enlarged and mildly enlarged ileocolic lymph nodes. No other definite signs of metastatic disease are noted in the chest, abdomen or pelvis. 2. There are several nonspecific liver lesions. These have a relatively benign appearance, favored to represent tiny cysts, but are technically indeterminate. These could be definitively characterized with MRI of the abdomen with and without IV gadolinium if clinically appropriate. 3. There also multiple tiny pulmonary nodules scattered throughout the lungs bilaterally, which are highly nonspecific and favored to be benign, likely areas of mucoid impaction within terminal bronchioles. Attention on future followup studies is recommended. 4. Unchanged adrenal thickening bilaterally, most compatible with adenomatous hyperplasia. 5. Additional incidental findings, as above.   Electronically Signed   By: Vinnie Langton M.D.   On: 01/10/2014 17:24   Ct Abdomen Pelvis W Contrast  01/10/2014   CLINICAL DATA:  63 year old female with cecal mass recently noted on colonoscopy. Evaluate for metastatic disease.  EXAM: CT CHEST, ABDOMEN, AND PELVIS WITH CONTRAST  TECHNIQUE: Multidetector CT imaging of the chest, abdomen and pelvis was performed following the standard protocol during bolus administration of intravenous contrast.  CONTRAST:  143mL OMNIPAQUE IOHEXOL 300 MG/ML  SOLN  COMPARISON:  CT of the abdomen and pelvis 06/28/2013.  FINDINGS: CT CHEST FINDINGS  Mediastinum: Heart size is mildly enlarged. There is no significant pericardial fluid, thickening or pericardial calcification. No pathologically enlarged mediastinal or hilar lymph nodes. Small hiatal  hernia.  Lungs/Pleura: A few scattered 1-2 mm pulmonary nodules are seen throughout the lungs bilaterally, predominantly in the periphery in a peribronchovascular distribution, favored to represent areas of mucoid impaction within terminal bronchioles. No larger more suspicious appearing pulmonary nodules or masses are otherwise noted. No acute  consolidative airspace disease. No pleural effusions.  Musculoskeletal: There are no aggressive appearing lytic or blastic lesions noted in the visualized portions of the skeleton.  CT ABDOMEN AND PELVIS FINDINGS  Hepatobiliary: There are 3 tiny low-attenuation liver lesions, largest of which is a 7 mm low attenuation lesion in segment 4A. No other suspicious appearing hepatic lesions. No intrahepatic or extrahepatic biliary ductal dilatation. Gallbladder is normal in appearance.  Pancreas: Unremarkable.  Spleen: Sub cm low-attenuation lesions in the spleen are incompletely characterized. However, 1 of these normalizes on the delayed phase images, and is presumably related to early perfusion (the other lesion was incompletely visualized, but similar appearance, also favored to be related to perfusion).  Adrenals/Urinary Tract: Diffuse thickening of the adrenal glands, similar to prior examinations, presumably adenomatous hyperplasia. Bilateral kidneys are normal in appearance. No hydroureteronephrosis. Urinary bladder is normal in appearance.  Stomach/Bowel: The appearance of the stomach is normal. No pathologic dilatation of small bowel or colon. There is extensive masslike thickening of the colonic wall in the cecum, which extends through the ileocecal valve into the distal ileum. Notably, given the marked wall thickening in this region, the lumen appears relatively preserved. Image 84 of series 2 demonstrates slight haziness in the pericolonic fat lateral to the cecum. Several borderline enlarged and mildly enlarged ileocolic lymph nodes are noted measuring up to 11 mm.   Vascular/Lymphatic: Mild atherosclerotic disease is noted throughout the abdominal and pelvic vasculature, without definite aneurysm. Several borderline enlarged and mildly enlarged ileocolic lymph nodes measuring up to 11 mm are noted. No other definite lymphadenopathy identified in the abdomen or pelvis.  Reproductive: Uterus and ovaries are unremarkable in appearance.  Other: No significant volume of ascites.  No pneumoperitoneum.  Musculoskeletal: There are no aggressive appearing lytic or blastic lesions noted in the visualized portions of the skeleton.  IMPRESSION: 1. Masslike thickening of the cecum which appears to go retrograde through the ileocecal valve into the distal ileum. This has been seen to some extent or another on prior examinations dating back to 09/05/2008. This remains concerning for neoplasm (potentially either a colonic adenocarcinoma or lymphoma), however, it is notable for relatively slow growth. Today's examination demonstrates some haziness in the pericolonic fat lateral to the lesion, and some borderline enlarged and mildly enlarged ileocolic lymph nodes. No other definite signs of metastatic disease are noted in the chest, abdomen or pelvis. 2. There are several nonspecific liver lesions. These have a relatively benign appearance, favored to represent tiny cysts, but are technically indeterminate. These could be definitively characterized with MRI of the abdomen with and without IV gadolinium if clinically appropriate. 3. There also multiple tiny pulmonary nodules scattered throughout the lungs bilaterally, which are highly nonspecific and favored to be benign, likely areas of mucoid impaction within terminal bronchioles. Attention on future followup studies is recommended. 4. Unchanged adrenal thickening bilaterally, most compatible with adenomatous hyperplasia. 5. Additional incidental findings, as above.   Electronically Signed   By: Vinnie Langton M.D.   On: 01/10/2014 17:24          Subjective: Pain is controlled. She denies any fevers, chills, chest pain, shortness breath, nausea, vomiting, diarrhea.  Objective: Filed Vitals:   01/11/14 1327 01/11/14 1340 01/11/14 1350 01/11/14 1444  BP:  134/64 132/58 143/69  Pulse:  75 77 75  Temp:      TempSrc:      Resp: 16 19 15 15   Height:      Weight:  SpO2: 96% 96% 95% 98%    Intake/Output Summary (Last 24 hours) at 01/11/14 1548 Last data filed at 01/11/14 1237  Gross per 24 hour  Intake   1700 ml  Output    330 ml  Net   1370 ml   Weight change:  Exam:   General:  Pt is alert, follows commands appropriately, not in acute distress  HEENT: No icterus, No thrush,  [place]/AT  Cardiovascular: RRR, S1/S2, no rubs, no gallops  Respiratory: CTA bilaterally, no wheezing, no crackles, no rhonchi  Abdomen: Soft/diminished BS, diffuse tender, non distended, no peritoneal sign  Extremities: No edema, No lymphangitis, No petechiae, No rashes, no synovitis  Data Reviewed: Basic Metabolic Panel:  Recent Labs Lab 01/09/14 1210 01/10/14 0416 01/11/14 0450  NA 141 141 142  K 4.0 3.7 3.3*  CL 105 109 108  CO2 23 21 24   GLUCOSE 103* 108* 102*  BUN 16 11 7   CREATININE 1.14* 1.16* 1.13*  CALCIUM 8.8 8.6 8.7   Liver Function Tests:  Recent Labs Lab 01/09/14 1210  AST 12  ALT <5  ALKPHOS 89  BILITOT <0.2*  PROT 6.9  ALBUMIN 3.2*   No results for input(s): LIPASE, AMYLASE in the last 168 hours. No results for input(s): AMMONIA in the last 168 hours. CBC:  Recent Labs Lab 01/08/14 1549 01/09/14 1210 01/10/14 0416 01/11/14 0450  WBC 8.3 5.8 6.6 6.2  NEUTROABS 6.3  --   --   --   HGB 6.1 Repeated and verified X2.* 5.8* 8.1* 8.2*  HCT 21.8 Repeated and verified X2.* 22.0* 27.7* 28.6*  MCV 66.8 Repeated and verified X2.* 70.1* 73.7* 74.3*  PLT 499.0* 352 282 283   Cardiac Enzymes: No results for input(s): CKTOTAL, CKMB, CKMBINDEX, TROPONINI in the last 168 hours. BNP: Invalid  input(s): POCBNP CBG:  Recent Labs Lab 01/09/14 1837  GLUCAP 104*    Recent Results (from the past 240 hour(s))  MRSA PCR Screening     Status: None   Collection Time: 01/11/14  7:21 AM  Result Value Ref Range Status   MRSA by PCR NEGATIVE NEGATIVE Final    Comment:        The GeneXpert MRSA Assay (FDA approved for NASAL specimens only), is one component of a comprehensive MRSA colonization surveillance program. It is not intended to diagnose MRSA infection nor to guide or monitor treatment for MRSA infections.      Scheduled Meds: . amLODipine  10 mg Oral Daily  . Chlorhexidine Gluconate Cloth  6 each Topical Q0600  . dextrose 5 % and 0.45 % NaCl with KCl 20 mEq/L      . [START ON 01/12/2014] enoxaparin (LOVENOX) injection  40 mg Subcutaneous Q24H  . HYDROmorphone      . HYDROmorphone      . morphine   Intravenous 6 times per day  . nicotine  14 mg Transdermal Daily  . pantoprazole  40 mg Oral Daily   Continuous Infusions: . sodium chloride 75 mL/hr at 01/11/14 0610  . dextrose 5 % and 0.45 % NaCl with KCl 20 mEq/L 100 mL/hr at 01/11/14 1439     Virgina Deakins, DO  [facility] Pager [phone]  If 7PM-7AM, please contact night-coverage [email or web address] Password [email or web address] 01/11/2014, 3:48 PM   LOS: 2 days

## 2014-01-11 NOTE — Transfer of Care (Signed)
Immediate Anesthesia Transfer of Care Note  Patient: Gail Carlson  Procedure(s) Performed: Procedure(s): EXPLORATORY LAPAROTOMY (N/A) PARTIAL COLECTOMY (N/A)  Patient Location: PACU  Anesthesia Type:General  Level of Consciousness: awake, alert , oriented and patient cooperative  Airway & Oxygen Therapy: Patient Spontanous Breathing and Patient connected to face mask oxygen  Post-op Assessment: Report given to PACU RN, Post -op Vital signs reviewed and stable and Patient moving all extremities  Post vital signs: Reviewed and stable  Complications: No apparent anesthesia complications

## 2014-01-11 NOTE — Brief Op Note (Signed)
01/09/2014 - 01/11/2014  11:50 AM  PATIENT:  Gail Carlson  63 y.o. female  PRE-OPERATIVE DIAGNOSIS:  colon cancer  POST-OPERATIVE DIAGNOSIS:  colon cancer  PROCEDURE:  Right colectomy  SURGEON:  Surgeon(s) and Role:    * Armandina Gemma, MD - Primary    * Donnie Mesa, MD - Assisting  ANESTHESIA:   general  EBL:  Total I/O In: 1200 [I.V.:1200] Out: 230 [Urine:200; Blood:30]  BLOOD ADMINISTERED:none  DRAINS: none   LOCAL MEDICATIONS USED:  NONE  SPECIMEN:  Excision  DISPOSITION OF SPECIMEN:  PATHOLOGY  COUNTS:  YES  TOURNIQUET:  * No tourniquets in log *  DICTATION: .Other Dictation: Dictation Number [id number]  PLAN OF CARE: Admit to inpatient   PATIENT DISPOSITION:  PACU - hemodynamically stable.   Delay start of Pharmacological VTE agent (>24hrs) due to surgical blood loss or risk of bleeding: yes  Earnstine Regal, MD, [facility] Office: [phone]

## 2014-01-11 NOTE — Anesthesia Procedure Notes (Signed)
Procedure Name: Intubation Date/Time: 01/11/2014 10:04 AM Performed by: Carleene Cooper A Pre-anesthesia Checklist: Patient identified, Emergency Drugs available, Suction available, Patient being monitored and Timeout performed Patient Re-evaluated:Patient Re-evaluated prior to inductionOxygen Delivery Method: Circle system utilized Preoxygenation: Pre-oxygenation with 100% oxygen Intubation Type: IV induction Ventilation: Mask ventilation without difficulty Laryngoscope Size: Mac and 4 Grade View: Grade I Tube type: Oral Number of attempts: 1 Airway Equipment and Method: Stylet Placement Confirmation: ETT inserted through vocal cords under direct vision,  positive ETCO2 and breath sounds checked- equal and bilateral Secured at: 21 cm Tube secured with: Tape Dental Injury: Teeth and Oropharynx as per pre-operative assessment

## 2014-01-12 LAB — BASIC METABOLIC PANEL
Anion gap: 8 (ref 5–15)
BUN: 10 mg/dL (ref 6–23)
CALCIUM: 8.9 mg/dL (ref 8.4–10.5)
CHLORIDE: 105 meq/L (ref 96–112)
CO2: 24 meq/L (ref 19–32)
Creatinine, Ser: 1.1 mg/dL (ref 0.50–1.10)
GFR calc Af Amer: 60 mL/min — ABNORMAL LOW (ref >90)
GFR calc non Af Amer: 52 mL/min — ABNORMAL LOW (ref >90)
Glucose, Bld: 117 mg/dL — ABNORMAL HIGH (ref 70–99)
Potassium: 3.8 mEq/L (ref 3.7–5.3)
Sodium: 137 mEq/L (ref 137–147)

## 2014-01-12 LAB — MAGNESIUM: Magnesium: 1.9 mg/dL (ref 1.5–2.5)

## 2014-01-12 LAB — CBC
HEMATOCRIT: 29.1 % — AB (ref 36.0–46.0)
Hemoglobin: 8.3 g/dL — ABNORMAL LOW (ref 12.0–15.0)
MCH: 21.2 pg — ABNORMAL LOW (ref 26.0–34.0)
MCHC: 28.5 g/dL — ABNORMAL LOW (ref 30.0–36.0)
MCV: 74.4 fL — ABNORMAL LOW (ref 78.0–100.0)
Platelets: 272 10*3/uL (ref 150–400)
RBC: 3.91 MIL/uL (ref 3.87–5.11)
RDW: 20.6 % — AB (ref 11.5–15.5)
WBC: 13.8 10*3/uL — AB (ref 4.0–10.5)

## 2014-01-12 MED ORDER — HYDRALAZINE HCL 20 MG/ML IJ SOLN
10.0000 mg | Freq: Once | INTRAMUSCULAR | Status: AC
  Administered 2014-01-12: 10 mg via INTRAVENOUS
  Filled 2014-01-12: qty 1

## 2014-01-12 MED ORDER — PANTOPRAZOLE SODIUM 40 MG IV SOLR
40.0000 mg | Freq: Every day | INTRAVENOUS | Status: DC
  Administered 2014-01-12 – 2014-01-16 (×5): 40 mg via INTRAVENOUS
  Filled 2014-01-12 (×6): qty 40

## 2014-01-12 NOTE — Progress Notes (Signed)
PROGRESS NOTE  Gail Carlson:[id number] DOB: Jun 24, 1950 DOA: 01/09/2014 PCP: No PCP Per Patient  Brief history 63 year old female with a history of iron deficiency anemia, GERD, hypertension presents with abdominal pain for at least the past 3 years. Patient states that she had an appendectomy approximately 4 years ago after which she began having abdominal pain and discomfort. The patient was diagnosed with iron deficiency anemia by [facility] and started on iron supplementation here she was supposed to take it 3 times a day, but has only been taking it twice a day because of GI distress and constipation. For the past 2-3 months, she has complained of worsening abdominal pain and solid food dysphagia. In addition, the patient has had intermittent nausea and vomiting with certain foods which have also worsened her abdominal discomfort. When she is constipated, she has noted intermittent hematochezia.  She went to see Dr. Silvio Pate on 12/09/2013. Outpatient endoscopy and colonoscopy were planned and blood work was obtained. However her blood work showed that she had hemoglobin 6.1. As a result the patient was strictly admitted. The patient has received 2 units PRBC.  Colonoscopy 01/10/2014 revealed high-grade right colon mass. EGD revealed high-grade distal esophageal stricture that was dilated. Gen. surgery was consulted. CT of the chest, abdomen, and pelvis did not show any distant metastasis. The patient was taken to surgery on 01/11/2014 and a right hemicolectomy was performed. Assessment/Plan: Symptomatic anemia/iron deficiency anemia/ -Patient has received 2 units PRBC -transfused with IV iron -Restart ferrous sulfate 3 times a day for one month, then twice a day -Advised vitamin C to assist with absorption -EGD= high-grade distal esophageal stricture dilated -Colonoscopy--high-grade right colon mass -Appreciate GI evaluation Solid food dysphagia -EGD 01/11/2014= high-grade  distal esophageal stricture dilated Right colon/cecal mass -Appreciate general surgery -01/11/2014--right hemicolectomy -Await pathology -Will need oncology follow-up after discharge -received Entereg on 01/11/14 -increase ambulation -await return of gut function -CEA 1.5 Tobacco abuse -Tobacco cessation discussed -NicoDerm patch Hypertension -Continue amlodipine now pt able to take po Hypokalemia -Repleted -Check magnesium--1.9 Family Communication: Daughter updated at beside 12/12 Disposition Plan: Home when medically stable     Procedures/Studies: Ct Chest W Contrast  01/10/2014   CLINICAL DATA:  63 year old female with cecal mass recently noted on colonoscopy. Evaluate for metastatic disease.  EXAM: CT CHEST, ABDOMEN, AND PELVIS WITH CONTRAST  TECHNIQUE: Multidetector CT imaging of the chest, abdomen and pelvis was performed following the standard protocol during bolus administration of intravenous contrast.  CONTRAST:  125mL OMNIPAQUE IOHEXOL 300 MG/ML  SOLN  COMPARISON:  CT of the abdomen and pelvis 06/28/2013.  FINDINGS: CT CHEST FINDINGS  Mediastinum: Heart size is mildly enlarged. There is no significant pericardial fluid, thickening or pericardial calcification. No pathologically enlarged mediastinal or hilar lymph nodes. Small hiatal hernia.  Lungs/Pleura: A few scattered 1-2 mm pulmonary nodules are seen throughout the lungs bilaterally, predominantly in the periphery in a peribronchovascular distribution, favored to represent areas of mucoid impaction within terminal bronchioles. No larger more suspicious appearing pulmonary nodules or masses are otherwise noted. No acute consolidative airspace disease. No pleural effusions.  Musculoskeletal: There are no aggressive appearing lytic or blastic lesions noted in the visualized portions of the skeleton.  CT ABDOMEN AND PELVIS FINDINGS  Hepatobiliary: There are 3 tiny low-attenuation liver lesions, largest of which is a 7 mm  low attenuation lesion in segment 4A. No other suspicious appearing hepatic lesions. No intrahepatic or extrahepatic biliary ductal dilatation. Gallbladder  is normal in appearance.  Pancreas: Unremarkable.  Spleen: Sub cm low-attenuation lesions in the spleen are incompletely characterized. However, 1 of these normalizes on the delayed phase images, and is presumably related to early perfusion (the other lesion was incompletely visualized, but similar appearance, also favored to be related to perfusion).  Adrenals/Urinary Tract: Diffuse thickening of the adrenal glands, similar to prior examinations, presumably adenomatous hyperplasia. Bilateral kidneys are normal in appearance. No hydroureteronephrosis. Urinary bladder is normal in appearance.  Stomach/Bowel: The appearance of the stomach is normal. No pathologic dilatation of small bowel or colon. There is extensive masslike thickening of the colonic wall in the cecum, which extends through the ileocecal valve into the distal ileum. Notably, given the marked wall thickening in this region, the lumen appears relatively preserved. Image 84 of series 2 demonstrates slight haziness in the pericolonic fat lateral to the cecum. Several borderline enlarged and mildly enlarged ileocolic lymph nodes are noted measuring up to 11 mm.  Vascular/Lymphatic: Mild atherosclerotic disease is noted throughout the abdominal and pelvic vasculature, without definite aneurysm. Several borderline enlarged and mildly enlarged ileocolic lymph nodes measuring up to 11 mm are noted. No other definite lymphadenopathy identified in the abdomen or pelvis.  Reproductive: Uterus and ovaries are unremarkable in appearance.  Other: No significant volume of ascites.  No pneumoperitoneum.  Musculoskeletal: There are no aggressive appearing lytic or blastic lesions noted in the visualized portions of the skeleton.  IMPRESSION: 1. Masslike thickening of the cecum which appears to go retrograde through  the ileocecal valve into the distal ileum. This has been seen to some extent or another on prior examinations dating back to 09/05/2008. This remains concerning for neoplasm (potentially either a colonic adenocarcinoma or lymphoma), however, it is notable for relatively slow growth. Today's examination demonstrates some haziness in the pericolonic fat lateral to the lesion, and some borderline enlarged and mildly enlarged ileocolic lymph nodes. No other definite signs of metastatic disease are noted in the chest, abdomen or pelvis. 2. There are several nonspecific liver lesions. These have a relatively benign appearance, favored to represent tiny cysts, but are technically indeterminate. These could be definitively characterized with MRI of the abdomen with and without IV gadolinium if clinically appropriate. 3. There also multiple tiny pulmonary nodules scattered throughout the lungs bilaterally, which are highly nonspecific and favored to be benign, likely areas of mucoid impaction within terminal bronchioles. Attention on future followup studies is recommended. 4. Unchanged adrenal thickening bilaterally, most compatible with adenomatous hyperplasia. 5. Additional incidental findings, as above.   Electronically Signed   By: Vinnie Langton M.D.   On: 01/10/2014 17:24   Ct Abdomen Pelvis W Contrast  01/10/2014   CLINICAL DATA:  63 year old female with cecal mass recently noted on colonoscopy. Evaluate for metastatic disease.  EXAM: CT CHEST, ABDOMEN, AND PELVIS WITH CONTRAST  TECHNIQUE: Multidetector CT imaging of the chest, abdomen and pelvis was performed following the standard protocol during bolus administration of intravenous contrast.  CONTRAST:  129mL OMNIPAQUE IOHEXOL 300 MG/ML  SOLN  COMPARISON:  CT of the abdomen and pelvis 06/28/2013.  FINDINGS: CT CHEST FINDINGS  Mediastinum: Heart size is mildly enlarged. There is no significant pericardial fluid, thickening or pericardial calcification. No  pathologically enlarged mediastinal or hilar lymph nodes. Small hiatal hernia.  Lungs/Pleura: A few scattered 1-2 mm pulmonary nodules are seen throughout the lungs bilaterally, predominantly in the periphery in a peribronchovascular distribution, favored to represent areas of mucoid impaction within terminal bronchioles. No larger more  suspicious appearing pulmonary nodules or masses are otherwise noted. No acute consolidative airspace disease. No pleural effusions.  Musculoskeletal: There are no aggressive appearing lytic or blastic lesions noted in the visualized portions of the skeleton.  CT ABDOMEN AND PELVIS FINDINGS  Hepatobiliary: There are 3 tiny low-attenuation liver lesions, largest of which is a 7 mm low attenuation lesion in segment 4A. No other suspicious appearing hepatic lesions. No intrahepatic or extrahepatic biliary ductal dilatation. Gallbladder is normal in appearance.  Pancreas: Unremarkable.  Spleen: Sub cm low-attenuation lesions in the spleen are incompletely characterized. However, 1 of these normalizes on the delayed phase images, and is presumably related to early perfusion (the other lesion was incompletely visualized, but similar appearance, also favored to be related to perfusion).  Adrenals/Urinary Tract: Diffuse thickening of the adrenal glands, similar to prior examinations, presumably adenomatous hyperplasia. Bilateral kidneys are normal in appearance. No hydroureteronephrosis. Urinary bladder is normal in appearance.  Stomach/Bowel: The appearance of the stomach is normal. No pathologic dilatation of small bowel or colon. There is extensive masslike thickening of the colonic wall in the cecum, which extends through the ileocecal valve into the distal ileum. Notably, given the marked wall thickening in this region, the lumen appears relatively preserved. Image 84 of series 2 demonstrates slight haziness in the pericolonic fat lateral to the cecum. Several borderline enlarged and  mildly enlarged ileocolic lymph nodes are noted measuring up to 11 mm.  Vascular/Lymphatic: Mild atherosclerotic disease is noted throughout the abdominal and pelvic vasculature, without definite aneurysm. Several borderline enlarged and mildly enlarged ileocolic lymph nodes measuring up to 11 mm are noted. No other definite lymphadenopathy identified in the abdomen or pelvis.  Reproductive: Uterus and ovaries are unremarkable in appearance.  Other: No significant volume of ascites.  No pneumoperitoneum.  Musculoskeletal: There are no aggressive appearing lytic or blastic lesions noted in the visualized portions of the skeleton.  IMPRESSION: 1. Masslike thickening of the cecum which appears to go retrograde through the ileocecal valve into the distal ileum. This has been seen to some extent or another on prior examinations dating back to 09/05/2008. This remains concerning for neoplasm (potentially either a colonic adenocarcinoma or lymphoma), however, it is notable for relatively slow growth. Today's examination demonstrates some haziness in the pericolonic fat lateral to the lesion, and some borderline enlarged and mildly enlarged ileocolic lymph nodes. No other definite signs of metastatic disease are noted in the chest, abdomen or pelvis. 2. There are several nonspecific liver lesions. These have a relatively benign appearance, favored to represent tiny cysts, but are technically indeterminate. These could be definitively characterized with MRI of the abdomen with and without IV gadolinium if clinically appropriate. 3. There also multiple tiny pulmonary nodules scattered throughout the lungs bilaterally, which are highly nonspecific and favored to be benign, likely areas of mucoid impaction within terminal bronchioles. Attention on future followup studies is recommended. 4. Unchanged adrenal thickening bilaterally, most compatible with adenomatous hyperplasia. 5. Additional incidental findings, as above.    Electronically Signed   By: Vinnie Langton M.D.   On: 01/10/2014 17:24         Subjective: She denies fevers, chills, chest pain, shortness breath, nausea, vomiting, diarrhea. Pain is controlled. She is not passing flatus yet. No bowel movement.  Objective: Filed Vitals:   01/12/14 0905 01/12/14 1149 01/12/14 1415 01/12/14 1645  BP: 190/90 155/60 152/68   Pulse: 72 71 76   Temp:  98.4 F (36.9 C) 98.5 F (36.9 C)  TempSrc:  Oral Oral   Resp:  18  19  Height:      Weight:      SpO2:  98% 100% 95%    Intake/Output Summary (Last 24 hours) at 01/12/14 1825 Last data filed at 01/12/14 0947  Gross per 24 hour  Intake   1075 ml  Output   1350 ml  Net   -275 ml   Weight change:  Exam:   General:  Pt is alert, follows commands appropriately, not in acute distress  HEENT: No icterus, No thrush, No neck mass, [place]/AT  Cardiovascular: RRR, S1/S2, no rubs, no gallops  Respiratory: CTA bilaterally, no wheezing, no crackles, no rhonchi  Abdomen: Soft/+BS, non tender, non distended, no guarding  Extremities: No edema, No lymphangitis, No petechiae, No rashes, no synovitis  Data Reviewed: Basic Metabolic Panel:  Recent Labs Lab 01/09/14 1210 01/10/14 0416 01/11/14 0450 01/12/14 0610  NA 141 141 142 137  K 4.0 3.7 3.3* 3.8  CL 105 109 108 105  CO2 23 21 24 24   GLUCOSE 103* 108* 102* 117*  BUN 16 11 7 10   CREATININE 1.14* 1.16* 1.13* 1.10  CALCIUM 8.8 8.6 8.7 8.9  MG  --   --   --  1.9   Liver Function Tests:  Recent Labs Lab 01/09/14 1210  AST 12  ALT <5  ALKPHOS 89  BILITOT <0.2*  PROT 6.9  ALBUMIN 3.2*   No results for input(s): LIPASE, AMYLASE in the last 168 hours. No results for input(s): AMMONIA in the last 168 hours. CBC:  Recent Labs Lab 01/08/14 1549 01/09/14 1210 01/10/14 0416 01/11/14 0450 01/12/14 0610  WBC 8.3 5.8 6.6 6.2 13.8*  NEUTROABS 6.3  --   --   --   --   HGB 6.1 Repeated and verified X2.* 5.8* 8.1* 8.2* 8.3*  HCT 21.8  Repeated and verified X2.* 22.0* 27.7* 28.6* 29.1*  MCV 66.8 Repeated and verified X2.* 70.1* 73.7* 74.3* 74.4*  PLT 499.0* 352 282 283 272   Cardiac Enzymes: No results for input(s): CKTOTAL, CKMB, CKMBINDEX, TROPONINI in the last 168 hours. BNP: Invalid input(s): POCBNP CBG:  Recent Labs Lab 01/09/14 1837  GLUCAP 104*    Recent Results (from the past 240 hour(s))  MRSA PCR Screening     Status: None   Collection Time: 01/11/14  7:21 AM  Result Value Ref Range Status   MRSA by PCR NEGATIVE NEGATIVE Final    Comment:        The GeneXpert MRSA Assay (FDA approved for NASAL specimens only), is one component of a comprehensive MRSA colonization surveillance program. It is not intended to diagnose MRSA infection nor to guide or monitor treatment for MRSA infections.      Scheduled Meds: . amLODipine  10 mg Oral Daily  . Chlorhexidine Gluconate Cloth  6 each Topical Q0600  . enoxaparin (LOVENOX) injection  40 mg Subcutaneous Q24H  . morphine   Intravenous 6 times per day  . nicotine  14 mg Transdermal Daily  . pantoprazole (PROTONIX) IV  40 mg Intravenous QHS   Continuous Infusions: . dextrose 5 % and 0.45 % NaCl with KCl 20 mEq/L 75 mL/hr at 01/12/14 1191     Gail Tiegs, DO  [facility] Pager [phone]  If 7PM-7AM, please contact night-coverage [email or web address] Password [email or web address] 01/12/2014, 6:25 PM   LOS: 3 days

## 2014-01-12 NOTE — Progress Notes (Signed)
Patient ID: Gail Carlson, female   DOB: 07/03/1950, 63 y.o.   MRN: [id number]  General Surgery - [facility] - Progress Note  POD# 1  Subjective: Patient in good spirits.  Wants foley cath out.  Pain well controlled.  Objective: Vital signs in last 24 hours: Temp:  [97.7 F (36.5 C)-97.9 F (36.6 C)] 97.7 F (36.5 C) (12/13 0436) Pulse Rate:  [66-90] 72 (12/13 0905) Resp:  [12-21] 16 (12/13 0854) BP: (132-190)/(58-90) 190/90 mmHg (12/13 0905) SpO2:  [10 %-100 %] 100 % (12/13 0854) Last BM Date: 01/08/14  Intake/Output from previous day: 12/12 0701 - 12/13 0700 In: 3475 [I.V.:3475] Out: 780 [Urine:750; Blood:30]  Exam: HEENT - clear, not icteric Neck - soft Chest - clear bilaterally Cor - RRR, no murmur Abd - soft without distension; BS present; wound intact, dressing intact Ext - no significant edema Neuro - grossly intact, no focal deficits  Lab Results:   Recent Labs  01/11/14 0450 01/12/14 0610  WBC 6.2 13.8*  HGB 8.2* 8.3*  HCT 28.6* 29.1*  PLT 283 272     Recent Labs  01/11/14 0450 01/12/14 0610  NA 142 137  K 3.3* 3.8  CL 108 105  CO2 24 24  GLUCOSE 102* 117*  BUN 7 10  CREATININE 1.13* 1.10  CALCIUM 8.7 8.9    Studies/Results: Ct Chest W Contrast  01/10/2014   CLINICAL DATA:  63 year old female with cecal mass recently noted on colonoscopy. Evaluate for metastatic disease.  EXAM: CT CHEST, ABDOMEN, AND PELVIS WITH CONTRAST  TECHNIQUE: Multidetector CT imaging of the chest, abdomen and pelvis was performed following the standard protocol during bolus administration of intravenous contrast.  CONTRAST:  164mL OMNIPAQUE IOHEXOL 300 MG/ML  SOLN  COMPARISON:  CT of the abdomen and pelvis 06/28/2013.  FINDINGS: CT CHEST FINDINGS  Mediastinum: Heart size is mildly enlarged. There is no significant pericardial fluid, thickening or pericardial calcification. No pathologically enlarged mediastinal or hilar lymph nodes. Small hiatal  hernia.  Lungs/Pleura: A few scattered 1-2 mm pulmonary nodules are seen throughout the lungs bilaterally, predominantly in the periphery in a peribronchovascular distribution, favored to represent areas of mucoid impaction within terminal bronchioles. No larger more suspicious appearing pulmonary nodules or masses are otherwise noted. No acute consolidative airspace disease. No pleural effusions.  Musculoskeletal: There are no aggressive appearing lytic or blastic lesions noted in the visualized portions of the skeleton.  CT ABDOMEN AND PELVIS FINDINGS  Hepatobiliary: There are 3 tiny low-attenuation liver lesions, largest of which is a 7 mm low attenuation lesion in segment 4A. No other suspicious appearing hepatic lesions. No intrahepatic or extrahepatic biliary ductal dilatation. Gallbladder is normal in appearance.  Pancreas: Unremarkable.  Spleen: Sub cm low-attenuation lesions in the spleen are incompletely characterized. However, 1 of these normalizes on the delayed phase images, and is presumably related to early perfusion (the other lesion was incompletely visualized, but similar appearance, also favored to be related to perfusion).  Adrenals/Urinary Tract: Diffuse thickening of the adrenal glands, similar to prior examinations, presumably adenomatous hyperplasia. Bilateral kidneys are normal in appearance. No hydroureteronephrosis. Urinary bladder is normal in appearance.  Stomach/Bowel: The appearance of the stomach is normal. No pathologic dilatation of small bowel or colon. There is extensive masslike thickening of the colonic wall in the cecum, which extends through the ileocecal valve into the distal ileum. Notably, given the marked wall thickening in this region, the lumen appears relatively preserved. Image 84 of series 2 demonstrates slight  haziness in the pericolonic fat lateral to the cecum. Several borderline enlarged and mildly enlarged ileocolic lymph nodes are noted measuring up to 11 mm.   Vascular/Lymphatic: Mild atherosclerotic disease is noted throughout the abdominal and pelvic vasculature, without definite aneurysm. Several borderline enlarged and mildly enlarged ileocolic lymph nodes measuring up to 11 mm are noted. No other definite lymphadenopathy identified in the abdomen or pelvis.  Reproductive: Uterus and ovaries are unremarkable in appearance.  Other: No significant volume of ascites.  No pneumoperitoneum.  Musculoskeletal: There are no aggressive appearing lytic or blastic lesions noted in the visualized portions of the skeleton.  IMPRESSION: 1. Masslike thickening of the cecum which appears to go retrograde through the ileocecal valve into the distal ileum. This has been seen to some extent or another on prior examinations dating back to 09/05/2008. This remains concerning for neoplasm (potentially either a colonic adenocarcinoma or lymphoma), however, it is notable for relatively slow growth. Today's examination demonstrates some haziness in the pericolonic fat lateral to the lesion, and some borderline enlarged and mildly enlarged ileocolic lymph nodes. No other definite signs of metastatic disease are noted in the chest, abdomen or pelvis. 2. There are several nonspecific liver lesions. These have a relatively benign appearance, favored to represent tiny cysts, but are technically indeterminate. These could be definitively characterized with MRI of the abdomen with and without IV gadolinium if clinically appropriate. 3. There also multiple tiny pulmonary nodules scattered throughout the lungs bilaterally, which are highly nonspecific and favored to be benign, likely areas of mucoid impaction within terminal bronchioles. Attention on future followup studies is recommended. 4. Unchanged adrenal thickening bilaterally, most compatible with adenomatous hyperplasia. 5. Additional incidental findings, as above.   Electronically Signed   By: Vinnie Langton M.D.   On: 01/10/2014 17:24    Ct Abdomen Pelvis W Contrast  01/10/2014   CLINICAL DATA:  63 year old female with cecal mass recently noted on colonoscopy. Evaluate for metastatic disease.  EXAM: CT CHEST, ABDOMEN, AND PELVIS WITH CONTRAST  TECHNIQUE: Multidetector CT imaging of the chest, abdomen and pelvis was performed following the standard protocol during bolus administration of intravenous contrast.  CONTRAST:  167mL OMNIPAQUE IOHEXOL 300 MG/ML  SOLN  COMPARISON:  CT of the abdomen and pelvis 06/28/2013.  FINDINGS: CT CHEST FINDINGS  Mediastinum: Heart size is mildly enlarged. There is no significant pericardial fluid, thickening or pericardial calcification. No pathologically enlarged mediastinal or hilar lymph nodes. Small hiatal hernia.  Lungs/Pleura: A few scattered 1-2 mm pulmonary nodules are seen throughout the lungs bilaterally, predominantly in the periphery in a peribronchovascular distribution, favored to represent areas of mucoid impaction within terminal bronchioles. No larger more suspicious appearing pulmonary nodules or masses are otherwise noted. No acute consolidative airspace disease. No pleural effusions.  Musculoskeletal: There are no aggressive appearing lytic or blastic lesions noted in the visualized portions of the skeleton.  CT ABDOMEN AND PELVIS FINDINGS  Hepatobiliary: There are 3 tiny low-attenuation liver lesions, largest of which is a 7 mm low attenuation lesion in segment 4A. No other suspicious appearing hepatic lesions. No intrahepatic or extrahepatic biliary ductal dilatation. Gallbladder is normal in appearance.  Pancreas: Unremarkable.  Spleen: Sub cm low-attenuation lesions in the spleen are incompletely characterized. However, 1 of these normalizes on the delayed phase images, and is presumably related to early perfusion (the other lesion was incompletely visualized, but similar appearance, also favored to be related to perfusion).  Adrenals/Urinary Tract: Diffuse thickening of the adrenal  glands, similar to  prior examinations, presumably adenomatous hyperplasia. Bilateral kidneys are normal in appearance. No hydroureteronephrosis. Urinary bladder is normal in appearance.  Stomach/Bowel: The appearance of the stomach is normal. No pathologic dilatation of small bowel or colon. There is extensive masslike thickening of the colonic wall in the cecum, which extends through the ileocecal valve into the distal ileum. Notably, given the marked wall thickening in this region, the lumen appears relatively preserved. Image 84 of series 2 demonstrates slight haziness in the pericolonic fat lateral to the cecum. Several borderline enlarged and mildly enlarged ileocolic lymph nodes are noted measuring up to 11 mm.  Vascular/Lymphatic: Mild atherosclerotic disease is noted throughout the abdominal and pelvic vasculature, without definite aneurysm. Several borderline enlarged and mildly enlarged ileocolic lymph nodes measuring up to 11 mm are noted. No other definite lymphadenopathy identified in the abdomen or pelvis.  Reproductive: Uterus and ovaries are unremarkable in appearance.  Other: No significant volume of ascites.  No pneumoperitoneum.  Musculoskeletal: There are no aggressive appearing lytic or blastic lesions noted in the visualized portions of the skeleton.  IMPRESSION: 1. Masslike thickening of the cecum which appears to go retrograde through the ileocecal valve into the distal ileum. This has been seen to some extent or another on prior examinations dating back to 09/05/2008. This remains concerning for neoplasm (potentially either a colonic adenocarcinoma or lymphoma), however, it is notable for relatively slow growth. Today's examination demonstrates some haziness in the pericolonic fat lateral to the lesion, and some borderline enlarged and mildly enlarged ileocolic lymph nodes. No other definite signs of metastatic disease are noted in the chest, abdomen or pelvis. 2. There are several  nonspecific liver lesions. These have a relatively benign appearance, favored to represent tiny cysts, but are technically indeterminate. These could be definitively characterized with MRI of the abdomen with and without IV gadolinium if clinically appropriate. 3. There also multiple tiny pulmonary nodules scattered throughout the lungs bilaterally, which are highly nonspecific and favored to be benign, likely areas of mucoid impaction within terminal bronchioles. Attention on future followup studies is recommended. 4. Unchanged adrenal thickening bilaterally, most compatible with adenomatous hyperplasia. 5. Additional incidental findings, as above.   Electronically Signed   By: Vinnie Langton M.D.   On: 01/10/2014 17:24    Assessment / Plan: 1.  Status post right colectomy  Will begin clear liquid diet today - on Entereg  OOB, ambulate  Discontinue Foley cath  PCA for pain control  Decrease IVF  Earnstine Regal, MD, [facility] Office: [phone]  01/12/2014

## 2014-01-13 ENCOUNTER — Encounter (HOSPITAL_COMMUNITY): Payer: Self-pay | Admitting: Internal Medicine

## 2014-01-13 LAB — BASIC METABOLIC PANEL
Anion gap: 10 (ref 5–15)
BUN: 6 mg/dL (ref 6–23)
CO2: 25 mEq/L (ref 19–32)
Calcium: 8.8 mg/dL (ref 8.4–10.5)
Chloride: 105 mEq/L (ref 96–112)
Creatinine, Ser: 1.08 mg/dL (ref 0.50–1.10)
GFR calc Af Amer: 61 mL/min — ABNORMAL LOW (ref >90)
GFR calc non Af Amer: 53 mL/min — ABNORMAL LOW (ref >90)
GLUCOSE: 119 mg/dL — AB (ref 70–99)
POTASSIUM: 3.7 meq/L (ref 3.7–5.3)
Sodium: 140 mEq/L (ref 137–147)

## 2014-01-13 LAB — CBC
HEMATOCRIT: 29.4 % — AB (ref 36.0–46.0)
Hemoglobin: 8.2 g/dL — ABNORMAL LOW (ref 12.0–15.0)
MCH: 21 pg — ABNORMAL LOW (ref 26.0–34.0)
MCHC: 27.9 g/dL — ABNORMAL LOW (ref 30.0–36.0)
MCV: 75.4 fL — ABNORMAL LOW (ref 78.0–100.0)
Platelets: 263 10*3/uL (ref 150–400)
RBC: 3.9 MIL/uL (ref 3.87–5.11)
RDW: 21.7 % — ABNORMAL HIGH (ref 11.5–15.5)
WBC: 9.9 10*3/uL (ref 4.0–10.5)

## 2014-01-13 MED ORDER — HYDRALAZINE HCL 20 MG/ML IJ SOLN
5.0000 mg | Freq: Once | INTRAMUSCULAR | Status: AC
  Administered 2014-01-13: 5 mg via INTRAVENOUS
  Filled 2014-01-13: qty 1

## 2014-01-13 NOTE — Progress Notes (Signed)
2 Days Post-Op  Subjective: Walking. No BM or flatus.  Tolerating some liquids.  Objective: Vital signs in last 24 hours: Temp:  [98.4 F (36.9 C)-99 F (37.2 C)] 99 F (37.2 C) (12/14 0507) Pulse Rate:  [71-92] 88 (12/14 0621) Resp:  [10-23] 20 (12/14 0507) BP: (152-170)/(60-80) 153/80 mmHg (12/14 0621) SpO2:  [95 %-100 %] 96 % (12/14 0507) Last BM Date: 01/08/14  Intake/Output from previous day: 12/13 0701 - 12/14 0700 In: 2429.2 [P.O.:420; I.V.:2009.2] Out: 2250 [Urine:2250] Intake/Output this shift:    PE: General- In NAD Abdomen-soft, few bowel sounds,wound clean and intact  Lab Results:   Recent Labs  01/12/14 0610 01/13/14 0454  WBC 13.8* 9.9  HGB 8.3* 8.2*  HCT 29.1* 29.4*  PLT 272 263   BMET  Recent Labs  01/12/14 0610 01/13/14 0454  NA 137 140  K 3.8 3.7  CL 105 105  CO2 24 25  GLUCOSE 117* 119*  BUN 10 6  CREATININE 1.10 1.08  CALCIUM 8.9 8.8   PT/INR No results for input(s): LABPROT, INR in the last 72 hours. Comprehensive Metabolic Panel:    Component Value Date/Time   NA 140 01/13/2014 0454   NA 137 01/12/2014 0610   K 3.7 01/13/2014 0454   K 3.8 01/12/2014 0610   CL 105 01/13/2014 0454   CL 105 01/12/2014 0610   CO2 25 01/13/2014 0454   CO2 24 01/12/2014 0610   BUN 6 01/13/2014 0454   BUN 10 01/12/2014 0610   CREATININE 1.08 01/13/2014 0454   CREATININE 1.10 01/12/2014 0610   GLUCOSE 119* 01/13/2014 0454   GLUCOSE 117* 01/12/2014 0610   CALCIUM 8.8 01/13/2014 0454   CALCIUM 8.9 01/12/2014 0610   AST 12 01/09/2014 1210   AST 10 11/04/2013 2148   ALT <5 01/09/2014 1210   ALT 7 11/04/2013 2148   ALKPHOS 89 01/09/2014 1210   ALKPHOS 102 11/04/2013 2148   BILITOT <0.2* 01/09/2014 1210   BILITOT <0.2* 11/04/2013 2148   PROT 6.9 01/09/2014 1210   PROT 7.6 11/04/2013 2148   ALBUMIN 3.2* 01/09/2014 1210   ALBUMIN 3.5 11/04/2013 2148     Studies/Results: No results found.  Anti-infectives: Anti-infectives    Start      Dose/Rate Route Frequency Ordered Stop   01/11/14 0600  cefOXitin (MEFOXIN) 2 g in dextrose 5 % 50 mL IVPB     2 g100 mL/hr over 30 Minutes Intravenous On call to O.R. 01/10/14 1822 01/11/14 1006      Assessment Obstructing cecal mass s/p right colectomy 01/11/14-mild ileus   LOS: 4 days   Plan: Continue liquids until ileus improves.   Gail Carlson J 01/13/2014

## 2014-01-13 NOTE — Progress Notes (Signed)
PROGRESS NOTE  Gail Carlson ALP:[id number] DOB: Jan 11, 1951 DOA: 01/09/2014 PCP: No PCP Per Patient  Assessment/Plan: Symptomatic anemia/iron deficiency anemia/ -Patient has received 2 units PRBC -transfused with IV iron -Restart ferrous sulfate 3 times a day for one month, then twice a day -Advised vitamin C to assist with absorption -EGD= high-grade distal esophageal stricture dilated -Colonoscopy--high-grade right colon mass -Appreciate GI evaluation Solid food dysphagia/Esophageal Stricture -EGD 01/11/2014= high-grade distal esophageal stricture dilated Right colon/cecal mass/postop ileus -Appreciate general surgery -01/11/2014--right hemicolectomy -CEA 1.5--Await pathology -Will need oncology follow-up after discharge -received Entereg on 01/11/14 -increase ambulation -await return of gut function -CEA 1.5 Tobacco abuse -Tobacco cessation discussed -NicoDerm patch Hypertension -Continue amlodipine now pt able to take po Hypokalemia -Repleted -Check magnesium--1.9 Family Communication: Daughter updated at beside 12/12 Disposition Plan: Home when medically stable      Procedures/Studies: Ct Chest W Contrast  01/10/2014   CLINICAL DATA:  63 year old female with cecal mass recently noted on colonoscopy. Evaluate for metastatic disease.  EXAM: CT CHEST, ABDOMEN, AND PELVIS WITH CONTRAST  TECHNIQUE: Multidetector CT imaging of the chest, abdomen and pelvis was performed following the standard protocol during bolus administration of intravenous contrast.  CONTRAST:  161mL OMNIPAQUE IOHEXOL 300 MG/ML  SOLN  COMPARISON:  CT of the abdomen and pelvis 06/28/2013.  FINDINGS: CT CHEST FINDINGS  Mediastinum: Heart size is mildly enlarged. There is no significant pericardial fluid, thickening or pericardial calcification. No pathologically enlarged mediastinal or hilar lymph nodes. Small hiatal hernia.  Lungs/Pleura: A few scattered 1-2 mm pulmonary nodules are seen  throughout the lungs bilaterally, predominantly in the periphery in a peribronchovascular distribution, favored to represent areas of mucoid impaction within terminal bronchioles. No larger more suspicious appearing pulmonary nodules or masses are otherwise noted. No acute consolidative airspace disease. No pleural effusions.  Musculoskeletal: There are no aggressive appearing lytic or blastic lesions noted in the visualized portions of the skeleton.  CT ABDOMEN AND PELVIS FINDINGS  Hepatobiliary: There are 3 tiny low-attenuation liver lesions, largest of which is a 7 mm low attenuation lesion in segment 4A. No other suspicious appearing hepatic lesions. No intrahepatic or extrahepatic biliary ductal dilatation. Gallbladder is normal in appearance.  Pancreas: Unremarkable.  Spleen: Sub cm low-attenuation lesions in the spleen are incompletely characterized. However, 1 of these normalizes on the delayed phase images, and is presumably related to early perfusion (the other lesion was incompletely visualized, but similar appearance, also favored to be related to perfusion).  Adrenals/Urinary Tract: Diffuse thickening of the adrenal glands, similar to prior examinations, presumably adenomatous hyperplasia. Bilateral kidneys are normal in appearance. No hydroureteronephrosis. Urinary bladder is normal in appearance.  Stomach/Bowel: The appearance of the stomach is normal. No pathologic dilatation of small bowel or colon. There is extensive masslike thickening of the colonic wall in the cecum, which extends through the ileocecal valve into the distal ileum. Notably, given the marked wall thickening in this region, the lumen appears relatively preserved. Image 84 of series 2 demonstrates slight haziness in the pericolonic fat lateral to the cecum. Several borderline enlarged and mildly enlarged ileocolic lymph nodes are noted measuring up to 11 mm.  Vascular/Lymphatic: Mild atherosclerotic disease is noted throughout the  abdominal and pelvic vasculature, without definite aneurysm. Several borderline enlarged and mildly enlarged ileocolic lymph nodes measuring up to 11 mm are noted. No other definite lymphadenopathy identified in the abdomen or pelvis.  Reproductive: Uterus and ovaries are unremarkable in appearance.  Other: No significant volume of ascites.  No pneumoperitoneum.  Musculoskeletal: There are no aggressive appearing lytic or blastic lesions noted in the visualized portions of the skeleton.  IMPRESSION: 1. Masslike thickening of the cecum which appears to go retrograde through the ileocecal valve into the distal ileum. This has been seen to some extent or another on prior examinations dating back to 09/05/2008. This remains concerning for neoplasm (potentially either a colonic adenocarcinoma or lymphoma), however, it is notable for relatively slow growth. Today's examination demonstrates some haziness in the pericolonic fat lateral to the lesion, and some borderline enlarged and mildly enlarged ileocolic lymph nodes. No other definite signs of metastatic disease are noted in the chest, abdomen or pelvis. 2. There are several nonspecific liver lesions. These have a relatively benign appearance, favored to represent tiny cysts, but are technically indeterminate. These could be definitively characterized with MRI of the abdomen with and without IV gadolinium if clinically appropriate. 3. There also multiple tiny pulmonary nodules scattered throughout the lungs bilaterally, which are highly nonspecific and favored to be benign, likely areas of mucoid impaction within terminal bronchioles. Attention on future followup studies is recommended. 4. Unchanged adrenal thickening bilaterally, most compatible with adenomatous hyperplasia. 5. Additional incidental findings, as above.   Electronically Signed   By: Vinnie Langton M.D.   On: 01/10/2014 17:24   Ct Abdomen Pelvis W Contrast  01/10/2014   CLINICAL DATA:  63 year old  female with cecal mass recently noted on colonoscopy. Evaluate for metastatic disease.  EXAM: CT CHEST, ABDOMEN, AND PELVIS WITH CONTRAST  TECHNIQUE: Multidetector CT imaging of the chest, abdomen and pelvis was performed following the standard protocol during bolus administration of intravenous contrast.  CONTRAST:  146mL OMNIPAQUE IOHEXOL 300 MG/ML  SOLN  COMPARISON:  CT of the abdomen and pelvis 06/28/2013.  FINDINGS: CT CHEST FINDINGS  Mediastinum: Heart size is mildly enlarged. There is no significant pericardial fluid, thickening or pericardial calcification. No pathologically enlarged mediastinal or hilar lymph nodes. Small hiatal hernia.  Lungs/Pleura: A few scattered 1-2 mm pulmonary nodules are seen throughout the lungs bilaterally, predominantly in the periphery in a peribronchovascular distribution, favored to represent areas of mucoid impaction within terminal bronchioles. No larger more suspicious appearing pulmonary nodules or masses are otherwise noted. No acute consolidative airspace disease. No pleural effusions.  Musculoskeletal: There are no aggressive appearing lytic or blastic lesions noted in the visualized portions of the skeleton.  CT ABDOMEN AND PELVIS FINDINGS  Hepatobiliary: There are 3 tiny low-attenuation liver lesions, largest of which is a 7 mm low attenuation lesion in segment 4A. No other suspicious appearing hepatic lesions. No intrahepatic or extrahepatic biliary ductal dilatation. Gallbladder is normal in appearance.  Pancreas: Unremarkable.  Spleen: Sub cm low-attenuation lesions in the spleen are incompletely characterized. However, 1 of these normalizes on the delayed phase images, and is presumably related to early perfusion (the other lesion was incompletely visualized, but similar appearance, also favored to be related to perfusion).  Adrenals/Urinary Tract: Diffuse thickening of the adrenal glands, similar to prior examinations, presumably adenomatous hyperplasia.  Bilateral kidneys are normal in appearance. No hydroureteronephrosis. Urinary bladder is normal in appearance.  Stomach/Bowel: The appearance of the stomach is normal. No pathologic dilatation of small bowel or colon. There is extensive masslike thickening of the colonic wall in the cecum, which extends through the ileocecal valve into the distal ileum. Notably, given the marked wall thickening in this region, the lumen appears relatively preserved. Image 84 of series 2 demonstrates  slight haziness in the pericolonic fat lateral to the cecum. Several borderline enlarged and mildly enlarged ileocolic lymph nodes are noted measuring up to 11 mm.  Vascular/Lymphatic: Mild atherosclerotic disease is noted throughout the abdominal and pelvic vasculature, without definite aneurysm. Several borderline enlarged and mildly enlarged ileocolic lymph nodes measuring up to 11 mm are noted. No other definite lymphadenopathy identified in the abdomen or pelvis.  Reproductive: Uterus and ovaries are unremarkable in appearance.  Other: No significant volume of ascites.  No pneumoperitoneum.  Musculoskeletal: There are no aggressive appearing lytic or blastic lesions noted in the visualized portions of the skeleton.  IMPRESSION: 1. Masslike thickening of the cecum which appears to go retrograde through the ileocecal valve into the distal ileum. This has been seen to some extent or another on prior examinations dating back to 09/05/2008. This remains concerning for neoplasm (potentially either a colonic adenocarcinoma or lymphoma), however, it is notable for relatively slow growth. Today's examination demonstrates some haziness in the pericolonic fat lateral to the lesion, and some borderline enlarged and mildly enlarged ileocolic lymph nodes. No other definite signs of metastatic disease are noted in the chest, abdomen or pelvis. 2. There are several nonspecific liver lesions. These have a relatively benign appearance, favored to  represent tiny cysts, but are technically indeterminate. These could be definitively characterized with MRI of the abdomen with and without IV gadolinium if clinically appropriate. 3. There also multiple tiny pulmonary nodules scattered throughout the lungs bilaterally, which are highly nonspecific and favored to be benign, likely areas of mucoid impaction within terminal bronchioles. Attention on future followup studies is recommended. 4. Unchanged adrenal thickening bilaterally, most compatible with adenomatous hyperplasia. 5. Additional incidental findings, as above.   Electronically Signed   By: Vinnie Langton M.D.   On: 01/10/2014 17:24         Subjective:  patient states the pain is controlled still not passing flatus or having bowel movement. Able to tolerate some liquids. Denies fevers, chills, chest pain, shortness breath, vomiting, diarrhea, headache, dysuria  Objective: Filed Vitals:   01/13/14 0621 01/13/14 1257 01/13/14 1442 01/13/14 1557  BP: 153/80  147/68   Pulse: 88  83   Temp:   98.9 F (37.2 C)   TempSrc:   Oral   Resp:  24 20 17   Height:      Weight:      SpO2:  96% 99% 95%    Intake/Output Summary (Last 24 hours) at 01/13/14 1745 Last data filed at 01/13/14 1646  Gross per 24 hour  Intake   1980 ml  Output   2000 ml  Net    -20 ml   Weight change:  Exam:   General:  Pt is alert, follows commands appropriately, not in acute distress  HEENT: No icterus, No thrush, [place]/AT  Cardiovascular: RRR, S1/S2, no rubs, no gallops  Respiratory: CTA bilaterally, no wheezing, no crackles, no rhonchi  Abdomen: Soft/+BS, diffusely tender without any peritoneal signs, non distended, no guarding  Extremities: No edema, No lymphangitis, No petechiae, No rashes, no synovitis  Data Reviewed: Basic Metabolic Panel:  Recent Labs Lab 01/09/14 1210 01/10/14 0416 01/11/14 0450 01/12/14 0610 01/13/14 0454  NA 141 141 142 137 140  K 4.0 3.7 3.3* 3.8 3.7  CL 105  109 108 105 105  CO2 23 21 24 24 25   GLUCOSE 103* 108* 102* 117* 119*  BUN 16 11 7 10 6   CREATININE 1.14* 1.16* 1.13* 1.10 1.08  CALCIUM 8.8  8.6 8.7 8.9 8.8  MG  --   --   --  1.9  --    Liver Function Tests:  Recent Labs Lab 01/09/14 1210  AST 12  ALT <5  ALKPHOS 89  BILITOT <0.2*  PROT 6.9  ALBUMIN 3.2*   No results for input(s): LIPASE, AMYLASE in the last 168 hours. No results for input(s): AMMONIA in the last 168 hours. CBC:  Recent Labs Lab 01/08/14 1549 01/09/14 1210 01/10/14 0416 01/11/14 0450 01/12/14 0610 01/13/14 0454  WBC 8.3 5.8 6.6 6.2 13.8* 9.9  NEUTROABS 6.3  --   --   --   --   --   HGB 6.1 Repeated and verified X2.* 5.8* 8.1* 8.2* 8.3* 8.2*  HCT 21.8 Repeated and verified X2.* 22.0* 27.7* 28.6* 29.1* 29.4*  MCV 66.8 Repeated and verified X2.* 70.1* 73.7* 74.3* 74.4* 75.4*  PLT 499.0* 352 282 283 272 263   Cardiac Enzymes: No results for input(s): CKTOTAL, CKMB, CKMBINDEX, TROPONINI in the last 168 hours. BNP: Invalid input(s): POCBNP CBG:  Recent Labs Lab 01/09/14 1837  GLUCAP 104*    Recent Results (from the past 240 hour(s))  MRSA PCR Screening     Status: None   Collection Time: 01/11/14  7:21 AM  Result Value Ref Range Status   MRSA by PCR NEGATIVE NEGATIVE Final    Comment:        The GeneXpert MRSA Assay (FDA approved for NASAL specimens only), is one component of a comprehensive MRSA colonization surveillance program. It is not intended to diagnose MRSA infection nor to guide or monitor treatment for MRSA infections.      Scheduled Meds: . amLODipine  10 mg Oral Daily  . Chlorhexidine Gluconate Cloth  6 each Topical Q0600  . enoxaparin (LOVENOX) injection  40 mg Subcutaneous Q24H  . morphine   Intravenous 6 times per day  . nicotine  14 mg Transdermal Daily  . pantoprazole (PROTONIX) IV  40 mg Intravenous QHS   Continuous Infusions: . dextrose 5 % and 0.45 % NaCl with KCl 20 mEq/L 75 mL/hr at 01/13/14 1553      Darleen Moffitt, DO  [facility] Pager [phone]  If 7PM-7AM, please contact night-coverage [email or web address] Password [email or web address] 01/13/2014, 5:45 PM   LOS: 4 days

## 2014-01-13 NOTE — Progress Notes (Addendum)
Pt's BP this AM 170/76. MD on call notified. No new orders received. This RN to continue to monitor. Noreene Larsson RN, BSN   Order received for hydralazine. This RN to administer and continue to monitor. Noreene Larsson RN, BSN

## 2014-01-14 DIAGNOSIS — C189 Malignant neoplasm of colon, unspecified: Secondary | ICD-10-CM

## 2014-01-14 LAB — BASIC METABOLIC PANEL
Anion gap: 12 (ref 5–15)
BUN: 6 mg/dL (ref 6–23)
CALCIUM: 8.7 mg/dL (ref 8.4–10.5)
CO2: 23 meq/L (ref 19–32)
Chloride: 102 mEq/L (ref 96–112)
Creatinine, Ser: 1.04 mg/dL (ref 0.50–1.10)
GFR calc Af Amer: 64 mL/min — ABNORMAL LOW (ref >90)
GFR calc non Af Amer: 55 mL/min — ABNORMAL LOW (ref >90)
GLUCOSE: 115 mg/dL — AB (ref 70–99)
Potassium: 3.5 mEq/L — ABNORMAL LOW (ref 3.7–5.3)
Sodium: 137 mEq/L (ref 137–147)

## 2014-01-14 LAB — CBC
HEMATOCRIT: 30.1 % — AB (ref 36.0–46.0)
Hemoglobin: 8.4 g/dL — ABNORMAL LOW (ref 12.0–15.0)
MCH: 21.1 pg — AB (ref 26.0–34.0)
MCHC: 27.9 g/dL — ABNORMAL LOW (ref 30.0–36.0)
MCV: 75.4 fL — ABNORMAL LOW (ref 78.0–100.0)
Platelets: 306 10*3/uL (ref 150–400)
RBC: 3.99 MIL/uL (ref 3.87–5.11)
RDW: 22.1 % — AB (ref 11.5–15.5)
WBC: 8.1 10*3/uL (ref 4.0–10.5)

## 2014-01-14 MED ORDER — ZOLPIDEM TARTRATE 5 MG PO TABS
5.0000 mg | ORAL_TABLET | Freq: Once | ORAL | Status: AC
  Administered 2014-01-14: 5 mg via ORAL
  Filled 2014-01-14: qty 1

## 2014-01-14 NOTE — Progress Notes (Signed)
3 Days Post-Op  Subjective: No BM or flatus.  Feels a little bloated.  Objective: Vital signs in last 24 hours: Temp:  [98.5 F (36.9 C)-99.4 F (37.4 C)] 98.5 F (36.9 C) (12/15 0616) Pulse Rate:  [78-83] 80 (12/15 0616) Resp:  [11-24] 11 (12/15 0800) BP: (138-152)/(68-84) 141/77 mmHg (12/15 0616) SpO2:  [95 %-100 %] 98 % (12/15 0800) FiO2 (%):  [32 %] 32 % (12/15 0000) Last BM Date: 01/08/14  Intake/Output from previous day: 12/14 0701 - 12/15 0700 In: 2185 [P.O.:360; I.V.:1825] Out: 2400 [Urine:2400] Intake/Output this shift:    PE: General- In NAD Abdomen-soft, hypoactive bowel sounds,wound clean and intact  Lab Results:   Recent Labs  01/13/14 0454 01/14/14 0450  WBC 9.9 8.1  HGB 8.2* 8.4*  HCT 29.4* 30.1*  PLT 263 306   BMET  Recent Labs  01/13/14 0454 01/14/14 0450  NA 140 137  K 3.7 3.5*  CL 105 102  CO2 25 23  GLUCOSE 119* 115*  BUN 6 6  CREATININE 1.08 1.04  CALCIUM 8.8 8.7   PT/INR No results for input(s): LABPROT, INR in the last 72 hours. Comprehensive Metabolic Panel:    Component Value Date/Time   NA 137 01/14/2014 0450   NA 140 01/13/2014 0454   K 3.5* 01/14/2014 0450   K 3.7 01/13/2014 0454   CL 102 01/14/2014 0450   CL 105 01/13/2014 0454   CO2 23 01/14/2014 0450   CO2 25 01/13/2014 0454   BUN 6 01/14/2014 0450   BUN 6 01/13/2014 0454   CREATININE 1.04 01/14/2014 0450   CREATININE 1.08 01/13/2014 0454   GLUCOSE 115* 01/14/2014 0450   GLUCOSE 119* 01/13/2014 0454   CALCIUM 8.7 01/14/2014 0450   CALCIUM 8.8 01/13/2014 0454   AST 12 01/09/2014 1210   AST 10 11/04/2013 2148   ALT <5 01/09/2014 1210   ALT 7 11/04/2013 2148   ALKPHOS 89 01/09/2014 1210   ALKPHOS 102 11/04/2013 2148   BILITOT <0.2* 01/09/2014 1210   BILITOT <0.2* 11/04/2013 2148   PROT 6.9 01/09/2014 1210   PROT 7.6 11/04/2013 2148   ALBUMIN 3.2* 01/09/2014 1210   ALBUMIN 3.5 11/04/2013 2148     Studies/Results: No results  found.  Anti-infectives: Anti-infectives    Start     Dose/Rate Route Frequency Ordered Stop   01/11/14 0600  cefOXitin (MEFOXIN) 2 g in dextrose 5 % 50 mL IVPB     2 g100 mL/hr over 30 Minutes Intravenous On call to O.R. 01/10/14 1822 01/11/14 1006      Assessment Obstructing cecal mass s/p right colectomy 01/11/14-still with ileus; pathology is pending.   LOS: 5 days   Plan: Continue liquids until ileus improves.   Gail Carlson J 01/14/2014

## 2014-01-14 NOTE — Progress Notes (Signed)
PROGRESS NOTE  Gail Carlson EZM:[id number] DOB: Mar 18, 1950 DOA: 01/09/2014 PCP: No PCP Per Patient Brief history 63 year old female with a history of iron deficiency anemia, GERD, hypertension presents with abdominal pain for at least the past 3 years. Patient states that she had an appendectomy approximately 4 years ago after which she began having abdominal pain and discomfort. The patient was diagnosed with iron deficiency anemia by [facility] and started on iron supplementation here she was supposed to take it 3 times a day, but has only been taking it twice a day because of GI distress and constipation. For the past 2-3 months, she has complained of worsening abdominal pain and solid food dysphagia. In addition, the patient has had intermittent nausea and vomiting with certain foods which have also worsened her abdominal discomfort. When she is constipated, she has noted intermittent hematochezia. She denied any fevers, chills, chest pain, shortness of breath, but complains of generalized fatigue, weakness. She went to see Dr. Silvio Pate on 12/09/2013. Outpatient endoscopy and colonoscopy were planned and blood work was obtained. However her blood work showed that she had hemoglobin 6.1. As a result the patient was strictly admitted. The patient has received 2 units PRBC. Colonoscopy and EGD were performed during the hospitalization. Colonoscopy revealed a right colon mass, and the patient subsequently underwent a right hemicolectomy by general surgery on 01/11/2014 Assessment/Plan: Symptomatic anemia/iron deficiency anemia/ -Patient has received 2 units PRBC-->Hgb is stable post-op -transfused with IV iron -Restart ferrous sulfate 3 times a day for one month, then twice a day -Advised vitamin C to assist with absorption -EGD= high-grade distal esophageal stricture dilated -Colonoscopy--high-grade right colon mass -Appreciate GI evaluation Colon adenocarcinoma/R-Colon mass/postop  ileus -Appreciate general surgery -01/11/2014--right hemicolectomy -colonoscopy pathology= adenocarcinoma -CEA 1.5--Await pathology from surgery -Will need oncology follow-up after discharge -received Entereg on 01/11/14 -increase ambulation -await return of gut function Solid food dysphagia/Esophageal Stricture -EGD 01/11/2014= high-grade distal esophageal stricture dilated -tolerating liquids presently Tobacco abuse -Tobacco cessation discussed -NicoDerm patch Hypertension -Continue amlodipine now pt able to take po -BP partly elevated from pain Hypokalemia -Repleted -Check magnesium--1.9 Family Communication: Daughter updated at beside 12/14 Disposition Plan: Home when medically stable       Procedures/Studies: Ct Chest W Contrast  01/10/2014   CLINICAL DATA:  63 year old female with cecal mass recently noted on colonoscopy. Evaluate for metastatic disease.  EXAM: CT CHEST, ABDOMEN, AND PELVIS WITH CONTRAST  TECHNIQUE: Multidetector CT imaging of the chest, abdomen and pelvis was performed following the standard protocol during bolus administration of intravenous contrast.  CONTRAST:  14mL OMNIPAQUE IOHEXOL 300 MG/ML  SOLN  COMPARISON:  CT of the abdomen and pelvis 06/28/2013.  FINDINGS: CT CHEST FINDINGS  Mediastinum: Heart size is mildly enlarged. There is no significant pericardial fluid, thickening or pericardial calcification. No pathologically enlarged mediastinal or hilar lymph nodes. Small hiatal hernia.  Lungs/Pleura: A few scattered 1-2 mm pulmonary nodules are seen throughout the lungs bilaterally, predominantly in the periphery in a peribronchovascular distribution, favored to represent areas of mucoid impaction within terminal bronchioles. No larger more suspicious appearing pulmonary nodules or masses are otherwise noted. No acute consolidative airspace disease. No pleural effusions.  Musculoskeletal: There are no aggressive appearing lytic or blastic lesions  noted in the visualized portions of the skeleton.  CT ABDOMEN AND PELVIS FINDINGS  Hepatobiliary: There are 3 tiny low-attenuation liver lesions, largest of which is a 7 mm low attenuation lesion in segment 4A. No other  suspicious appearing hepatic lesions. No intrahepatic or extrahepatic biliary ductal dilatation. Gallbladder is normal in appearance.  Pancreas: Unremarkable.  Spleen: Sub cm low-attenuation lesions in the spleen are incompletely characterized. However, 1 of these normalizes on the delayed phase images, and is presumably related to early perfusion (the other lesion was incompletely visualized, but similar appearance, also favored to be related to perfusion).  Adrenals/Urinary Tract: Diffuse thickening of the adrenal glands, similar to prior examinations, presumably adenomatous hyperplasia. Bilateral kidneys are normal in appearance. No hydroureteronephrosis. Urinary bladder is normal in appearance.  Stomach/Bowel: The appearance of the stomach is normal. No pathologic dilatation of small bowel or colon. There is extensive masslike thickening of the colonic wall in the cecum, which extends through the ileocecal valve into the distal ileum. Notably, given the marked wall thickening in this region, the lumen appears relatively preserved. Image 84 of series 2 demonstrates slight haziness in the pericolonic fat lateral to the cecum. Several borderline enlarged and mildly enlarged ileocolic lymph nodes are noted measuring up to 11 mm.  Vascular/Lymphatic: Mild atherosclerotic disease is noted throughout the abdominal and pelvic vasculature, without definite aneurysm. Several borderline enlarged and mildly enlarged ileocolic lymph nodes measuring up to 11 mm are noted. No other definite lymphadenopathy identified in the abdomen or pelvis.  Reproductive: Uterus and ovaries are unremarkable in appearance.  Other: No significant volume of ascites.  No pneumoperitoneum.  Musculoskeletal: There are no aggressive  appearing lytic or blastic lesions noted in the visualized portions of the skeleton.  IMPRESSION: 1. Masslike thickening of the cecum which appears to go retrograde through the ileocecal valve into the distal ileum. This has been seen to some extent or another on prior examinations dating back to 09/05/2008. This remains concerning for neoplasm (potentially either a colonic adenocarcinoma or lymphoma), however, it is notable for relatively slow growth. Today's examination demonstrates some haziness in the pericolonic fat lateral to the lesion, and some borderline enlarged and mildly enlarged ileocolic lymph nodes. No other definite signs of metastatic disease are noted in the chest, abdomen or pelvis. 2. There are several nonspecific liver lesions. These have a relatively benign appearance, favored to represent tiny cysts, but are technically indeterminate. These could be definitively characterized with MRI of the abdomen with and without IV gadolinium if clinically appropriate. 3. There also multiple tiny pulmonary nodules scattered throughout the lungs bilaterally, which are highly nonspecific and favored to be benign, likely areas of mucoid impaction within terminal bronchioles. Attention on future followup studies is recommended. 4. Unchanged adrenal thickening bilaterally, most compatible with adenomatous hyperplasia. 5. Additional incidental findings, as above.   Electronically Signed   By: Vinnie Langton M.D.   On: 01/10/2014 17:24   Ct Abdomen Pelvis W Contrast  01/10/2014   CLINICAL DATA:  63 year old female with cecal mass recently noted on colonoscopy. Evaluate for metastatic disease.  EXAM: CT CHEST, ABDOMEN, AND PELVIS WITH CONTRAST  TECHNIQUE: Multidetector CT imaging of the chest, abdomen and pelvis was performed following the standard protocol during bolus administration of intravenous contrast.  CONTRAST:  165mL OMNIPAQUE IOHEXOL 300 MG/ML  SOLN  COMPARISON:  CT of the abdomen and pelvis  06/28/2013.  FINDINGS: CT CHEST FINDINGS  Mediastinum: Heart size is mildly enlarged. There is no significant pericardial fluid, thickening or pericardial calcification. No pathologically enlarged mediastinal or hilar lymph nodes. Small hiatal hernia.  Lungs/Pleura: A few scattered 1-2 mm pulmonary nodules are seen throughout the lungs bilaterally, predominantly in the periphery in a peribronchovascular distribution, favored  to represent areas of mucoid impaction within terminal bronchioles. No larger more suspicious appearing pulmonary nodules or masses are otherwise noted. No acute consolidative airspace disease. No pleural effusions.  Musculoskeletal: There are no aggressive appearing lytic or blastic lesions noted in the visualized portions of the skeleton.  CT ABDOMEN AND PELVIS FINDINGS  Hepatobiliary: There are 3 tiny low-attenuation liver lesions, largest of which is a 7 mm low attenuation lesion in segment 4A. No other suspicious appearing hepatic lesions. No intrahepatic or extrahepatic biliary ductal dilatation. Gallbladder is normal in appearance.  Pancreas: Unremarkable.  Spleen: Sub cm low-attenuation lesions in the spleen are incompletely characterized. However, 1 of these normalizes on the delayed phase images, and is presumably related to early perfusion (the other lesion was incompletely visualized, but similar appearance, also favored to be related to perfusion).  Adrenals/Urinary Tract: Diffuse thickening of the adrenal glands, similar to prior examinations, presumably adenomatous hyperplasia. Bilateral kidneys are normal in appearance. No hydroureteronephrosis. Urinary bladder is normal in appearance.  Stomach/Bowel: The appearance of the stomach is normal. No pathologic dilatation of small bowel or colon. There is extensive masslike thickening of the colonic wall in the cecum, which extends through the ileocecal valve into the distal ileum. Notably, given the marked wall thickening in this  region, the lumen appears relatively preserved. Image 84 of series 2 demonstrates slight haziness in the pericolonic fat lateral to the cecum. Several borderline enlarged and mildly enlarged ileocolic lymph nodes are noted measuring up to 11 mm.  Vascular/Lymphatic: Mild atherosclerotic disease is noted throughout the abdominal and pelvic vasculature, without definite aneurysm. Several borderline enlarged and mildly enlarged ileocolic lymph nodes measuring up to 11 mm are noted. No other definite lymphadenopathy identified in the abdomen or pelvis.  Reproductive: Uterus and ovaries are unremarkable in appearance.  Other: No significant volume of ascites.  No pneumoperitoneum.  Musculoskeletal: There are no aggressive appearing lytic or blastic lesions noted in the visualized portions of the skeleton.  IMPRESSION: 1. Masslike thickening of the cecum which appears to go retrograde through the ileocecal valve into the distal ileum. This has been seen to some extent or another on prior examinations dating back to 09/05/2008. This remains concerning for neoplasm (potentially either a colonic adenocarcinoma or lymphoma), however, it is notable for relatively slow growth. Today's examination demonstrates some haziness in the pericolonic fat lateral to the lesion, and some borderline enlarged and mildly enlarged ileocolic lymph nodes. No other definite signs of metastatic disease are noted in the chest, abdomen or pelvis. 2. There are several nonspecific liver lesions. These have a relatively benign appearance, favored to represent tiny cysts, but are technically indeterminate. These could be definitively characterized with MRI of the abdomen with and without IV gadolinium if clinically appropriate. 3. There also multiple tiny pulmonary nodules scattered throughout the lungs bilaterally, which are highly nonspecific and favored to be benign, likely areas of mucoid impaction within terminal bronchioles. Attention on future  followup studies is recommended. 4. Unchanged adrenal thickening bilaterally, most compatible with adenomatous hyperplasia. 5. Additional incidental findings, as above.   Electronically Signed   By: Vinnie Langton M.D.   On: 01/10/2014 17:24         Subjective: Patient denies any nausea, vomiting. She complains of some abdominal bloating. Denies any fevers, chills, chest pain, shortness breath  Objective: Filed Vitals:   01/14/14 0800 01/14/14 1200 01/14/14 1355 01/14/14 1600  BP:   152/70   Pulse:   81   Temp:  98.7 F (37.1 C)   TempSrc:   Oral   Resp: 11 26 25 25   Height:      Weight:      SpO2: 98% 98% 98% 98%    Intake/Output Summary (Last 24 hours) at 01/14/14 1901 Last data filed at 01/14/14 1700  Gross per 24 hour  Intake   2545 ml  Output   2750 ml  Net   -205 ml   Weight change:  Exam:   General:  Pt is alert, follows commands appropriately, not in acute distress  HEENT: No icterus, No thrush,  [place]/AT  Cardiovascular: RRR, S1/S2, no rubs, no gallops  Respiratory: CTA bilaterally, no wheezing, no crackles, no rhonchi  Abdomen: Soft/+BS, mild diffuse tenderness without any peritoneal signs , non distended, no guarding  Extremities: No edema, No lymphangitis, No petechiae, No rashes, no synovitis  Data Reviewed: Basic Metabolic Panel:  Recent Labs Lab 01/10/14 0416 01/11/14 0450 01/12/14 0610 01/13/14 0454 01/14/14 0450  NA 141 142 137 140 137  K 3.7 3.3* 3.8 3.7 3.5*  CL 109 108 105 105 102  CO2 21 24 24 25 23   GLUCOSE 108* 102* 117* 119* 115*  BUN 11 7 10 6 6   CREATININE 1.16* 1.13* 1.10 1.08 1.04  CALCIUM 8.6 8.7 8.9 8.8 8.7  MG  --   --  1.9  --   --    Liver Function Tests:  Recent Labs Lab 01/09/14 1210  AST 12  ALT <5  ALKPHOS 89  BILITOT <0.2*  PROT 6.9  ALBUMIN 3.2*   No results for input(s): LIPASE, AMYLASE in the last 168 hours. No results for input(s): AMMONIA in the last 168 hours. CBC:  Recent Labs Lab  01/08/14 1549  01/10/14 0416 01/11/14 0450 01/12/14 0610 01/13/14 0454 01/14/14 0450  WBC 8.3  < > 6.6 6.2 13.8* 9.9 8.1  NEUTROABS 6.3  --   --   --   --   --   --   HGB 6.1 Repeated and verified X2.*  < > 8.1* 8.2* 8.3* 8.2* 8.4*  HCT 21.8 Repeated and verified X2.*  < > 27.7* 28.6* 29.1* 29.4* 30.1*  MCV 66.8 Repeated and verified X2.*  < > 73.7* 74.3* 74.4* 75.4* 75.4*  PLT 499.0*  < > 282 283 272 263 306  < > = values in this interval not displayed. Cardiac Enzymes: No results for input(s): CKTOTAL, CKMB, CKMBINDEX, TROPONINI in the last 168 hours. BNP: Invalid input(s): POCBNP CBG:  Recent Labs Lab 01/09/14 1837  GLUCAP 104*    Recent Results (from the past 240 hour(s))  MRSA PCR Screening     Status: None   Collection Time: 01/11/14  7:21 AM  Result Value Ref Range Status   MRSA by PCR NEGATIVE NEGATIVE Final    Comment:        The GeneXpert MRSA Assay (FDA approved for NASAL specimens only), is one component of a comprehensive MRSA colonization surveillance program. It is not intended to diagnose MRSA infection nor to guide or monitor treatment for MRSA infections.      Scheduled Meds: . amLODipine  10 mg Oral Daily  . Chlorhexidine Gluconate Cloth  6 each Topical Q0600  . enoxaparin (LOVENOX) injection  40 mg Subcutaneous Q24H  . morphine   Intravenous 6 times per day  . nicotine  14 mg Transdermal Daily  . pantoprazole (PROTONIX) IV  40 mg Intravenous QHS   Continuous Infusions: . dextrose 5 % and 0.45 %  NaCl with KCl 20 mEq/L 75 mL/hr at 01/13/14 1553     Kymoni Monday, DO  [facility] Pager [phone]  If 7PM-7AM, please contact night-coverage [email or web address] Password [email or web address] 01/14/2014, 7:01 PM   LOS: 5 days

## 2014-01-15 LAB — BASIC METABOLIC PANEL
Anion gap: 9 (ref 5–15)
BUN: 7 mg/dL (ref 6–23)
CHLORIDE: 105 meq/L (ref 96–112)
CO2: 25 mEq/L (ref 19–32)
CREATININE: 1.03 mg/dL (ref 0.50–1.10)
Calcium: 8.5 mg/dL (ref 8.4–10.5)
GFR calc non Af Amer: 56 mL/min — ABNORMAL LOW (ref >90)
GFR, EST AFRICAN AMERICAN: 65 mL/min — AB (ref >90)
Glucose, Bld: 101 mg/dL — ABNORMAL HIGH (ref 70–99)
Potassium: 3.7 mEq/L (ref 3.7–5.3)
Sodium: 139 mEq/L (ref 137–147)

## 2014-01-15 MED ORDER — ZOLPIDEM TARTRATE 5 MG PO TABS
5.0000 mg | ORAL_TABLET | Freq: Every evening | ORAL | Status: DC | PRN
  Administered 2014-01-15 – 2014-01-16 (×2): 5 mg via ORAL
  Filled 2014-01-15 (×2): qty 1

## 2014-01-15 NOTE — Progress Notes (Signed)
4 Days Post-Op  Subjective: No BM or flatus.  No nausea.  Tolerating clear liquids.  Objective: Vital signs in last 24 hours: Temp:  [98.5 F (36.9 C)-98.7 F (37.1 C)] 98.6 F (37 C) (12/16 0438) Pulse Rate:  [74-81] 77 (12/16 0438) Resp:  [12-26] 20 (12/16 0742) BP: (139-153)/(70-77) 139/77 mmHg (12/16 0438) SpO2:  [98 %-100 %] 98 % (12/16 0742) Last BM Date: 01/08/14  Intake/Output from previous day: 12/15 0701 - 12/16 0700 In: 720 [P.O.:720] Out: 2750 [Urine:2750] Intake/Output this shift:    PE: General- In NAD Abdomen-soft, hypoactive bowel sounds,wound clean and intact  Lab Results:   Recent Labs  01/13/14 0454 01/14/14 0450  WBC 9.9 8.1  HGB 8.2* 8.4*  HCT 29.4* 30.1*  PLT 263 306   BMET  Recent Labs  01/14/14 0450 01/15/14 0500  NA 137 139  K 3.5* 3.7  CL 102 105  CO2 23 25  GLUCOSE 115* 101*  BUN 6 7  CREATININE 1.04 1.03  CALCIUM 8.7 8.5   PT/INR No results for input(s): LABPROT, INR in the last 72 hours. Comprehensive Metabolic Panel:    Component Value Date/Time   NA 139 01/15/2014 0500   NA 137 01/14/2014 0450   K 3.7 01/15/2014 0500   K 3.5* 01/14/2014 0450   CL 105 01/15/2014 0500   CL 102 01/14/2014 0450   CO2 25 01/15/2014 0500   CO2 23 01/14/2014 0450   BUN 7 01/15/2014 0500   BUN 6 01/14/2014 0450   CREATININE 1.03 01/15/2014 0500   CREATININE 1.04 01/14/2014 0450   GLUCOSE 101* 01/15/2014 0500   GLUCOSE 115* 01/14/2014 0450   CALCIUM 8.5 01/15/2014 0500   CALCIUM 8.7 01/14/2014 0450   AST 12 01/09/2014 1210   AST 10 11/04/2013 2148   ALT <5 01/09/2014 1210   ALT 7 11/04/2013 2148   ALKPHOS 89 01/09/2014 1210   ALKPHOS 102 11/04/2013 2148   BILITOT <0.2* 01/09/2014 1210   BILITOT <0.2* 11/04/2013 2148   PROT 6.9 01/09/2014 1210   PROT 7.6 11/04/2013 2148   ALBUMIN 3.2* 01/09/2014 1210   ALBUMIN 3.5 11/04/2013 2148     Studies/Results: No results found.  Anti-infectives: Anti-infectives    Start      Dose/Rate Route Frequency Ordered Stop   01/11/14 0600  cefOXitin (MEFOXIN) 2 g in dextrose 5 % 50 mL IVPB     2 g100 mL/hr over 30 Minutes Intravenous On call to O.R. 01/10/14 1822 01/11/14 1006      Assessment Obstructing cecal mass s/p right colectomy 01/11/14-still with ileus; pathology is still pending.   LOS: 6 days   Plan:  Full liquids.   Marijayne Rauth J 01/15/2014

## 2014-01-15 NOTE — Progress Notes (Signed)
PROGRESS NOTE  SUPRENA TRAVAGLINI GQQ:[id number] DOB: [birth year] DOA: 01/09/2014 PCP: No PCP Per Patient Brief history 63 year old female with a history of iron deficiency anemia, GERD, hypertension presents with abdominal pain for at least the past 3 years. Patient states that she had an appendectomy approximately 4 years ago after which she began having abdominal pain and discomfort. The patient was diagnosed with iron deficiency anemia by [facility] and started on iron supplementation here she was supposed to take it 3 times a day, but has only been taking it twice a day because of GI distress and constipation. For the past 2-3 months, she has complained of worsening abdominal pain and solid food dysphagia. In addition, the patient has had intermittent nausea and vomiting with certain foods which have also worsened her abdominal discomfort. When she is constipated, she has noted intermittent hematochezia. She denied any fevers, chills, chest pain, shortness of breath, but complains of generalized fatigue, weakness. She went to see Dr. Silvio Pate on 12/09/2013. Outpatient endoscopy and colonoscopy were planned and blood work was obtained. However her blood work showed that she had hemoglobin 6.1. As a result the patient was strictly admitted. The patient has received 2 units PRBC. Colonoscopy and EGD were performed during the hospitalization. Colonoscopy revealed a right colon mass, and the patient subsequently underwent a right hemicolectomy by general surgery on 01/11/2014  Assessment/Plan: Symptomatic anemia/iron deficiency anemia/ -Patient has received 2 units PRBC-->Hgb is stable post-op -also was transfused with IV iron -Restart ferrous sulfate 3 times a day for one month, then twice a day for maintenance -Advised vitamin C to assist with absorption -EGD= high-grade distal esophageal stricture dilated -Colonoscopy--high-grade right colon mass -Appreciate GI evaluation  Colon  adenocarcinoma/R-Colon mass/postop ileus -Appreciate general surgery, assistance, inputs and rec's -01/11/2014--right hemicolectomy -colonoscopy pathology= adenocarcinoma; but final results surgical pathology pending  -CEA 1.5- -Will need oncology follow-up after discharge -gut function improving; had BM, passing flatus  Solid food dysphagia/Esophageal Stricture -EGD 01/11/2014= high-grade distal esophageal stricture dilated -tolerating full liquids presently -had BM and is passing flatus, anticipate further advance in diet tomorrow (12/17)  Tobacco abuse -Tobacco cessation discussed -continue NicoDerm patch  Hypertension -Continue amlodipine now pt able to take po -BP partly elevated from pain -will monitor and adjust medication as needed   Hypokalemia -Repleted -Checked magnesium--1.9  Family Communication: Daughter updated at beside 12/14 Disposition Plan: Home when medically stable   Procedures/Studies: Ct Chest W Contrast  01/10/2014   CLINICAL DATA:  64 year old female with cecal mass recently noted on colonoscopy. Evaluate for metastatic disease.  EXAM: CT CHEST, ABDOMEN, AND PELVIS WITH CONTRAST  TECHNIQUE: Multidetector CT imaging of the chest, abdomen and pelvis was performed following the standard protocol during bolus administration of intravenous contrast.  CONTRAST:  14mL OMNIPAQUE IOHEXOL 300 MG/ML  SOLN  COMPARISON:  CT of the abdomen and pelvis 06/28/2013.  FINDINGS: CT CHEST FINDINGS  Mediastinum: Heart size is mildly enlarged. There is no significant pericardial fluid, thickening or pericardial calcification. No pathologically enlarged mediastinal or hilar lymph nodes. Small hiatal hernia.  Lungs/Pleura: A few scattered 1-2 mm pulmonary nodules are seen throughout the lungs bilaterally, predominantly in the periphery in a peribronchovascular distribution, favored to represent areas of mucoid impaction within terminal bronchioles. No larger more suspicious  appearing pulmonary nodules or masses are otherwise noted. No acute consolidative airspace disease. No pleural effusions.  Musculoskeletal: There are no aggressive appearing lytic or blastic lesions noted in the visualized  portions of the skeleton.  CT ABDOMEN AND PELVIS FINDINGS  Hepatobiliary: There are 3 tiny low-attenuation liver lesions, largest of which is a 7 mm low attenuation lesion in segment 4A. No other suspicious appearing hepatic lesions. No intrahepatic or extrahepatic biliary ductal dilatation. Gallbladder is normal in appearance.  Pancreas: Unremarkable.  Spleen: Sub cm low-attenuation lesions in the spleen are incompletely characterized. However, 1 of these normalizes on the delayed phase images, and is presumably related to early perfusion (the other lesion was incompletely visualized, but similar appearance, also favored to be related to perfusion).  Adrenals/Urinary Tract: Diffuse thickening of the adrenal glands, similar to prior examinations, presumably adenomatous hyperplasia. Bilateral kidneys are normal in appearance. No hydroureteronephrosis. Urinary bladder is normal in appearance.  Stomach/Bowel: The appearance of the stomach is normal. No pathologic dilatation of small bowel or colon. There is extensive masslike thickening of the colonic wall in the cecum, which extends through the ileocecal valve into the distal ileum. Notably, given the marked wall thickening in this region, the lumen appears relatively preserved. Image 84 of series 2 demonstrates slight haziness in the pericolonic fat lateral to the cecum. Several borderline enlarged and mildly enlarged ileocolic lymph nodes are noted measuring up to 11 mm.  Vascular/Lymphatic: Mild atherosclerotic disease is noted throughout the abdominal and pelvic vasculature, without definite aneurysm. Several borderline enlarged and mildly enlarged ileocolic lymph nodes measuring up to 11 mm are noted. No other definite lymphadenopathy  identified in the abdomen or pelvis.  Reproductive: Uterus and ovaries are unremarkable in appearance.  Other: No significant volume of ascites.  No pneumoperitoneum.  Musculoskeletal: There are no aggressive appearing lytic or blastic lesions noted in the visualized portions of the skeleton.  IMPRESSION: 1. Masslike thickening of the cecum which appears to go retrograde through the ileocecal valve into the distal ileum. This has been seen to some extent or another on prior examinations dating back to 09/05/2008. This remains concerning for neoplasm (potentially either a colonic adenocarcinoma or lymphoma), however, it is notable for relatively slow growth. Today's examination demonstrates some haziness in the pericolonic fat lateral to the lesion, and some borderline enlarged and mildly enlarged ileocolic lymph nodes. No other definite signs of metastatic disease are noted in the chest, abdomen or pelvis. 2. There are several nonspecific liver lesions. These have a relatively benign appearance, favored to represent tiny cysts, but are technically indeterminate. These could be definitively characterized with MRI of the abdomen with and without IV gadolinium if clinically appropriate. 3. There also multiple tiny pulmonary nodules scattered throughout the lungs bilaterally, which are highly nonspecific and favored to be benign, likely areas of mucoid impaction within terminal bronchioles. Attention on future followup studies is recommended. 4. Unchanged adrenal thickening bilaterally, most compatible with adenomatous hyperplasia. 5. Additional incidental findings, as above.   Electronically Signed   By: Vinnie Langton M.D.   On: 01/10/2014 17:24   Ct Abdomen Pelvis W Contrast  01/10/2014   CLINICAL DATA:  63 year old female with cecal mass recently noted on colonoscopy. Evaluate for metastatic disease.  EXAM: CT CHEST, ABDOMEN, AND PELVIS WITH CONTRAST  TECHNIQUE: Multidetector CT imaging of the chest, abdomen  and pelvis was performed following the standard protocol during bolus administration of intravenous contrast.  CONTRAST:  152mL OMNIPAQUE IOHEXOL 300 MG/ML  SOLN  COMPARISON:  CT of the abdomen and pelvis 06/28/2013.  FINDINGS: CT CHEST FINDINGS  Mediastinum: Heart size is mildly enlarged. There is no significant pericardial fluid, thickening or pericardial calcification. No  pathologically enlarged mediastinal or hilar lymph nodes. Small hiatal hernia.  Lungs/Pleura: A few scattered 1-2 mm pulmonary nodules are seen throughout the lungs bilaterally, predominantly in the periphery in a peribronchovascular distribution, favored to represent areas of mucoid impaction within terminal bronchioles. No larger more suspicious appearing pulmonary nodules or masses are otherwise noted. No acute consolidative airspace disease. No pleural effusions.  Musculoskeletal: There are no aggressive appearing lytic or blastic lesions noted in the visualized portions of the skeleton.  CT ABDOMEN AND PELVIS FINDINGS  Hepatobiliary: There are 3 tiny low-attenuation liver lesions, largest of which is a 7 mm low attenuation lesion in segment 4A. No other suspicious appearing hepatic lesions. No intrahepatic or extrahepatic biliary ductal dilatation. Gallbladder is normal in appearance.  Pancreas: Unremarkable.  Spleen: Sub cm low-attenuation lesions in the spleen are incompletely characterized. However, 1 of these normalizes on the delayed phase images, and is presumably related to early perfusion (the other lesion was incompletely visualized, but similar appearance, also favored to be related to perfusion).  Adrenals/Urinary Tract: Diffuse thickening of the adrenal glands, similar to prior examinations, presumably adenomatous hyperplasia. Bilateral kidneys are normal in appearance. No hydroureteronephrosis. Urinary bladder is normal in appearance.  Stomach/Bowel: The appearance of the stomach is normal. No pathologic dilatation of small  bowel or colon. There is extensive masslike thickening of the colonic wall in the cecum, which extends through the ileocecal valve into the distal ileum. Notably, given the marked wall thickening in this region, the lumen appears relatively preserved. Image 84 of series 2 demonstrates slight haziness in the pericolonic fat lateral to the cecum. Several borderline enlarged and mildly enlarged ileocolic lymph nodes are noted measuring up to 11 mm.  Vascular/Lymphatic: Mild atherosclerotic disease is noted throughout the abdominal and pelvic vasculature, without definite aneurysm. Several borderline enlarged and mildly enlarged ileocolic lymph nodes measuring up to 11 mm are noted. No other definite lymphadenopathy identified in the abdomen or pelvis.  Reproductive: Uterus and ovaries are unremarkable in appearance.  Other: No significant volume of ascites.  No pneumoperitoneum.  Musculoskeletal: There are no aggressive appearing lytic or blastic lesions noted in the visualized portions of the skeleton.  IMPRESSION: 1. Masslike thickening of the cecum which appears to go retrograde through the ileocecal valve into the distal ileum. This has been seen to some extent or another on prior examinations dating back to 09/05/2008. This remains concerning for neoplasm (potentially either a colonic adenocarcinoma or lymphoma), however, it is notable for relatively slow growth. Today's examination demonstrates some haziness in the pericolonic fat lateral to the lesion, and some borderline enlarged and mildly enlarged ileocolic lymph nodes. No other definite signs of metastatic disease are noted in the chest, abdomen or pelvis. 2. There are several nonspecific liver lesions. These have a relatively benign appearance, favored to represent tiny cysts, but are technically indeterminate. These could be definitively characterized with MRI of the abdomen with and without IV gadolinium if clinically appropriate. 3. There also multiple  tiny pulmonary nodules scattered throughout the lungs bilaterally, which are highly nonspecific and favored to be benign, likely areas of mucoid impaction within terminal bronchioles. Attention on future followup studies is recommended. 4. Unchanged adrenal thickening bilaterally, most compatible with adenomatous hyperplasia. 5. Additional incidental findings, as above.   Electronically Signed   By: Vinnie Langton M.D.   On: 01/10/2014 17:24     Subjective: Patient denies CP, SOB and abd pain; reports no N/V. Positive BM and flatus.  Objective: Filed Vitals:  01/15/14 1523 01/15/14 1740 01/15/14 2025 01/15/14 2050  BP:   145/76   Pulse:   70   Temp:   98.7 F (37.1 C)   TempSrc:   Oral   Resp: 20 19 20 20   Height:      Weight:      SpO2:  100% 100% 100%    Intake/Output Summary (Last 24 hours) at 01/15/14 2338 Last data filed at 01/15/14 1900  Gross per 24 hour  Intake    600 ml  Output   1101 ml  Net   -501 ml   Weight change:  Exam:   General:  Pt is alert, follows commands appropriately, not in acute distress; feeling better and reports BM and flatus.  HEENT: No icterus, No thrush,  [place]/AT  Cardiovascular: RRR, S1/S2, no rubs, no gallops  Respiratory: CTA bilaterally, no wheezing, no crackles, no rhonchi  Abdomen: Soft/+BS, mild diffuse tenderness without any peritoneal signs , non distended, no guarding  Extremities: No edema, No lymphangitis, No petechiae, No rashes, no synovitis  Data Reviewed: Basic Metabolic Panel:  Recent Labs Lab 01/11/14 0450 01/12/14 0610 01/13/14 0454 01/14/14 0450 01/15/14 0500  NA 142 137 140 137 139  K 3.3* 3.8 3.7 3.5* 3.7  CL 108 105 105 102 105  CO2 24 24 25 23 25   GLUCOSE 102* 117* 119* 115* 101*  BUN 7 10 6 6 7   CREATININE 1.13* 1.10 1.08 1.04 1.03  CALCIUM 8.7 8.9 8.8 8.7 8.5  MG  --  1.9  --   --   --    Liver Function Tests:  Recent Labs Lab 01/09/14 1210  AST 12  ALT <5  ALKPHOS 89  BILITOT <0.2*    PROT 6.9  ALBUMIN 3.2*   CBC:  Recent Labs Lab 01/10/14 0416 01/11/14 0450 01/12/14 0610 01/13/14 0454 01/14/14 0450  WBC 6.6 6.2 13.8* 9.9 8.1  HGB 8.1* 8.2* 8.3* 8.2* 8.4*  HCT 27.7* 28.6* 29.1* 29.4* 30.1*  MCV 73.7* 74.3* 74.4* 75.4* 75.4*  PLT 282 283 272 263 306   CBG:  Recent Labs Lab 01/09/14 1837  GLUCAP 104*    Recent Results (from the past 240 hour(s))  MRSA PCR Screening     Status: None   Collection Time: 01/11/14  7:21 AM  Result Value Ref Range Status   MRSA by PCR NEGATIVE NEGATIVE Final    Comment:        The GeneXpert MRSA Assay (FDA approved for NASAL specimens only), is one component of a comprehensive MRSA colonization surveillance program. It is not intended to diagnose MRSA infection nor to guide or monitor treatment for MRSA infections.      Scheduled Meds: . amLODipine  10 mg Oral Daily  . Chlorhexidine Gluconate Cloth  6 each Topical Q0600  . enoxaparin (LOVENOX) injection  40 mg Subcutaneous Q24H  . morphine   Intravenous 6 times per day  . nicotine  14 mg Transdermal Daily  . pantoprazole (PROTONIX) IV  40 mg Intravenous QHS   Continuous Infusions: . dextrose 5 % and 0.45 % NaCl with KCl 20 mEq/L 75 mL/hr at 01/14/14 Hyman Bible, MD  [facility] Pager [phone]  If 7PM-7AM, please contact night-coverage [email or web address] Password [email or web address] 01/15/2014, 11:38 PM   LOS: 6 days

## 2014-01-16 ENCOUNTER — Other Ambulatory Visit (HOSPITAL_COMMUNITY): Payer: Medicare Other

## 2014-01-16 LAB — CBC
HEMATOCRIT: 28.6 % — AB (ref 36.0–46.0)
HEMOGLOBIN: 8 g/dL — AB (ref 12.0–15.0)
MCH: 21.3 pg — ABNORMAL LOW (ref 26.0–34.0)
MCHC: 28 g/dL — AB (ref 30.0–36.0)
MCV: 76.1 fL — ABNORMAL LOW (ref 78.0–100.0)
Platelets: 318 10*3/uL (ref 150–400)
RBC: 3.76 MIL/uL — ABNORMAL LOW (ref 3.87–5.11)
RDW: 22.1 % — ABNORMAL HIGH (ref 11.5–15.5)
WBC: 6.5 10*3/uL (ref 4.0–10.5)

## 2014-01-16 LAB — BASIC METABOLIC PANEL
Anion gap: 11 (ref 5–15)
BUN: 7 mg/dL (ref 6–23)
CO2: 24 mEq/L (ref 19–32)
Calcium: 8.4 mg/dL (ref 8.4–10.5)
Chloride: 102 mEq/L (ref 96–112)
Creatinine, Ser: 0.98 mg/dL (ref 0.50–1.10)
GFR calc Af Amer: 69 mL/min — ABNORMAL LOW (ref >90)
GFR, EST NON AFRICAN AMERICAN: 59 mL/min — AB (ref >90)
GLUCOSE: 101 mg/dL — AB (ref 70–99)
Potassium: 3.8 mEq/L (ref 3.7–5.3)
SODIUM: 137 meq/L (ref 137–147)

## 2014-01-16 LAB — MAGNESIUM: Magnesium: 1.8 mg/dL (ref 1.5–2.5)

## 2014-01-16 MED ORDER — MORPHINE SULFATE 2 MG/ML IJ SOLN
1.0000 mg | INTRAMUSCULAR | Status: DC | PRN
  Administered 2014-01-16 – 2014-01-17 (×3): 2 mg via INTRAVENOUS
  Filled 2014-01-16 (×3): qty 1

## 2014-01-16 MED ORDER — OXYCODONE-ACETAMINOPHEN 5-325 MG PO TABS
1.0000 | ORAL_TABLET | ORAL | Status: DC | PRN
  Administered 2014-01-16 – 2014-01-17 (×2): 1 via ORAL
  Filled 2014-01-16 (×2): qty 1

## 2014-01-16 NOTE — Progress Notes (Deleted)
CSW started [facility] process for patient. However, patient was not faxed out to facilities.   Willette Brace [phone] ED CSW 01/16/2014 10:15 PM

## 2014-01-16 NOTE — Progress Notes (Signed)
5 Days Post-Op  Subjective: She looks good and says she is hungry.  She wants real food today.  BM x 2, not walking much.    Objective: Vital signs in last 24 hours: Temp:  [98.2 F (36.8 C)-98.9 F (37.2 C)] 98.2 F (36.8 C) (12/17 0603) Pulse Rate:  [68-75] 75 (12/17 0603) Resp:  [10-21] 16 (12/17 0753) BP: (125-145)/(55-76) 139/71 mmHg (12/17 0603) SpO2:  [100 %] 100 % (12/17 0753) Last BM Date: 01/15/14 Full liquid diet 600 PO 1 Bm recorded Afebrile, VSS Labs are stable Intake/Output from previous day: 12/16 0701 - 12/17 0700 In: 600 [P.O.:600] Out: 401 [Urine:400; Stool:1] Intake/Output this shift:    General appearance: alert, cooperative and no distress Resp: clear to auscultation bilaterally GI: soft, non-tender; bowel sounds normal; no masses,  no organomegaly and incision looks fine.  Lab Results:   Recent Labs  01/14/14 0450 01/16/14 0414  WBC 8.1 6.5  HGB 8.4* 8.0*  HCT 30.1* 28.6*  PLT 306 318    BMET  Recent Labs  01/15/14 0500 01/16/14 0414  NA 139 137  K 3.7 3.8  CL 105 102  CO2 25 24  GLUCOSE 101* 101*  BUN 7 7  CREATININE 1.03 0.98  CALCIUM 8.5 8.4   PT/INR No results for input(s): LABPROT, INR in the last 72 hours.   Recent Labs Lab 01/09/14 1210  AST 12  ALT <5  ALKPHOS 89  BILITOT <0.2*  PROT 6.9  ALBUMIN 3.2*     Lipase     Component Value Date/Time   LIPASE 31 11/04/2013 2148     Studies/Results: No results found.  Medications: . amLODipine  10 mg Oral Daily  . Chlorhexidine Gluconate Cloth  6 each Topical Q0600  . enoxaparin (LOVENOX) injection  40 mg Subcutaneous Q24H  . morphine   Intravenous 6 times per day  . nicotine  14 mg Transdermal Daily  . pantoprazole (PROTONIX) IV  40 mg Intravenous QHS   . dextrose 5 % and 0.45 % NaCl with KCl 20 mEq/L 75 mL/hr at 01/15/14 2349     Pathology: 1. Colon, biopsy, ascending - ADENOCARCINOMA. - SEE MICROSCOPIC DESCRIPTION. 2. Colon, polyp(s), descend -  FRAGMENTS OF TUBULAR ADENOMA. NO HIGH GRADE DYSPLASIA OR MALIGNANCY IDENTIFIED. Assessment/Plan Cecal mass with near obstruction. Path showing Adenocarcinoma S/p Right colectomy, 01/11/14  Dr. [name] Post op ileus Anemia with transfusion, on FE before admission for anemia Hx of dysphagia and esophageal stricture -dilated at EGD 01/10/14 -  Need follow up for this Tobacco use Hypertension Chronic constipation Hypokalemia    PlaN:  Advance diet, stop her PCA, Po and iv back up pain meds, ambulate more and if she does well she may go home tomorrow .  LOS: 7 days    Gail Carlson 01/16/2014

## 2014-01-16 NOTE — Progress Notes (Signed)
Nutrition Brief Note  Malnutrition Screening Tool result is inaccurate.  Please consult if nutrition needs are identified.  Livianna Petraglia F Angelos Wasco MS RD LDN Clinical Dietitian Pager:[phone]   

## 2014-01-16 NOTE — Progress Notes (Signed)
PROGRESS NOTE  Gail Carlson SPQ:[id number] DOB: [birth year] DOA: 01/09/2014 PCP: No PCP Per Patient Brief history 63 year old female with a history of iron deficiency anemia, GERD, hypertension presents with abdominal pain for at least the past 3 years. Patient states that she had an appendectomy approximately 4 years ago after which she began having abdominal pain and discomfort. The patient was diagnosed with iron deficiency anemia by [facility] and started on iron supplementation here she was supposed to take it 3 times a day, but has only been taking it twice a day because of GI distress and constipation. For the past 2-3 months, she has complained of worsening abdominal pain and solid food dysphagia. In addition, the patient has had intermittent nausea and vomiting with certain foods which have also worsened her abdominal discomfort. When she is constipated, she has noted intermittent hematochezia. She denied any fevers, chills, chest pain, shortness of breath, but complains of generalized fatigue, weakness. She went to see Dr. Silvio Pate on 12/09/2013. Outpatient endoscopy and colonoscopy were planned and blood work was obtained. However her blood work showed that she had hemoglobin 6.1. As a result the patient was strictly admitted. The patient has received 2 units PRBC. Colonoscopy and EGD were performed during the hospitalization. Colonoscopy revealed a right colon mass, and the patient subsequently underwent a right hemicolectomy by general surgery on 01/11/2014  Assessment/Plan: Symptomatic anemia/iron deficiency anemia/ -Patient has received 2 units PRBC-->Hgb is stable post-op -also was transfused with IV iron -Restart ferrous sulfate 3 times a day for one month, then twice a day for maintenance -Advised vitamin C to assist with absorption -EGD= high-grade distal esophageal stricture dilated -Colonoscopy--high-grade right colon mass -Appreciate GI evaluation  Colon  adenocarcinoma/R-Colon mass/postop ileus -Appreciate general surgery, assistance, inputs and rec's -01/11/2014--right hemicolectomy -colonoscopy pathology= adenocarcinoma; but final results surgical pathology pending  -CEA 1.5- -Will need oncology follow-up after discharge -gut function improving; had BM, passing flatus  Solid food dysphagia/Esophageal Stricture -EGD 01/11/2014= high-grade distal esophageal stricture dilated -tolerating full liquids presently -had BM and is passing flatus, diet advance to mechanical soft -pain meds, changed to PO -if remains stable will discharge home in am (12/18)  Tobacco abuse -Tobacco cessation discussed -continue NicoDerm patch  Hypertension -Continue amlodipine now pt able to take po -BP partly elevated from pain; will continue pain control -low sodium diet when tolerating full PO  -will monitor and adjust medication as needed   Hypokalemia -Repleted; K 3.8 -continue supplementation   Family Communication: Daughter updated at beside 12/14 Disposition Plan: Home when medically stable   Procedures/Studies: Ct Chest W Contrast  01/10/2014   CLINICAL DATA:  64 year old female with cecal mass recently noted on colonoscopy. Evaluate for metastatic disease.  EXAM: CT CHEST, ABDOMEN, AND PELVIS WITH CONTRAST  TECHNIQUE: Multidetector CT imaging of the chest, abdomen and pelvis was performed following the standard protocol during bolus administration of intravenous contrast.  CONTRAST:  124mL OMNIPAQUE IOHEXOL 300 MG/ML  SOLN  COMPARISON:  CT of the abdomen and pelvis 06/28/2013.  FINDINGS: CT CHEST FINDINGS  Mediastinum: Heart size is mildly enlarged. There is no significant pericardial fluid, thickening or pericardial calcification. No pathologically enlarged mediastinal or hilar lymph nodes. Small hiatal hernia.  Lungs/Pleura: A few scattered 1-2 mm pulmonary nodules are seen throughout the lungs bilaterally, predominantly in the periphery in  a peribronchovascular distribution, favored to represent areas of mucoid impaction within terminal bronchioles. No larger more suspicious appearing pulmonary nodules or  masses are otherwise noted. No acute consolidative airspace disease. No pleural effusions.  Musculoskeletal: There are no aggressive appearing lytic or blastic lesions noted in the visualized portions of the skeleton.  CT ABDOMEN AND PELVIS FINDINGS  Hepatobiliary: There are 3 tiny low-attenuation liver lesions, largest of which is a 7 mm low attenuation lesion in segment 4A. No other suspicious appearing hepatic lesions. No intrahepatic or extrahepatic biliary ductal dilatation. Gallbladder is normal in appearance.  Pancreas: Unremarkable.  Spleen: Sub cm low-attenuation lesions in the spleen are incompletely characterized. However, 1 of these normalizes on the delayed phase images, and is presumably related to early perfusion (the other lesion was incompletely visualized, but similar appearance, also favored to be related to perfusion).  Adrenals/Urinary Tract: Diffuse thickening of the adrenal glands, similar to prior examinations, presumably adenomatous hyperplasia. Bilateral kidneys are normal in appearance. No hydroureteronephrosis. Urinary bladder is normal in appearance.  Stomach/Bowel: The appearance of the stomach is normal. No pathologic dilatation of small bowel or colon. There is extensive masslike thickening of the colonic wall in the cecum, which extends through the ileocecal valve into the distal ileum. Notably, given the marked wall thickening in this region, the lumen appears relatively preserved. Image 84 of series 2 demonstrates slight haziness in the pericolonic fat lateral to the cecum. Several borderline enlarged and mildly enlarged ileocolic lymph nodes are noted measuring up to 11 mm.  Vascular/Lymphatic: Mild atherosclerotic disease is noted throughout the abdominal and pelvic vasculature, without definite aneurysm. Several  borderline enlarged and mildly enlarged ileocolic lymph nodes measuring up to 11 mm are noted. No other definite lymphadenopathy identified in the abdomen or pelvis.  Reproductive: Uterus and ovaries are unremarkable in appearance.  Other: No significant volume of ascites.  No pneumoperitoneum.  Musculoskeletal: There are no aggressive appearing lytic or blastic lesions noted in the visualized portions of the skeleton.  IMPRESSION: 1. Masslike thickening of the cecum which appears to go retrograde through the ileocecal valve into the distal ileum. This has been seen to some extent or another on prior examinations dating back to 09/05/2008. This remains concerning for neoplasm (potentially either a colonic adenocarcinoma or lymphoma), however, it is notable for relatively slow growth. Today's examination demonstrates some haziness in the pericolonic fat lateral to the lesion, and some borderline enlarged and mildly enlarged ileocolic lymph nodes. No other definite signs of metastatic disease are noted in the chest, abdomen or pelvis. 2. There are several nonspecific liver lesions. These have a relatively benign appearance, favored to represent tiny cysts, but are technically indeterminate. These could be definitively characterized with MRI of the abdomen with and without IV gadolinium if clinically appropriate. 3. There also multiple tiny pulmonary nodules scattered throughout the lungs bilaterally, which are highly nonspecific and favored to be benign, likely areas of mucoid impaction within terminal bronchioles. Attention on future followup studies is recommended. 4. Unchanged adrenal thickening bilaterally, most compatible with adenomatous hyperplasia. 5. Additional incidental findings, as above.   Electronically Signed   By: Vinnie Langton M.D.   On: 01/10/2014 17:24   Ct Abdomen Pelvis W Contrast  01/10/2014   CLINICAL DATA:  63 year old female with cecal mass recently noted on colonoscopy. Evaluate for  metastatic disease.  EXAM: CT CHEST, ABDOMEN, AND PELVIS WITH CONTRAST  TECHNIQUE: Multidetector CT imaging of the chest, abdomen and pelvis was performed following the standard protocol during bolus administration of intravenous contrast.  CONTRAST:  153mL OMNIPAQUE IOHEXOL 300 MG/ML  SOLN  COMPARISON:  CT of the  abdomen and pelvis 06/28/2013.  FINDINGS: CT CHEST FINDINGS  Mediastinum: Heart size is mildly enlarged. There is no significant pericardial fluid, thickening or pericardial calcification. No pathologically enlarged mediastinal or hilar lymph nodes. Small hiatal hernia.  Lungs/Pleura: A few scattered 1-2 mm pulmonary nodules are seen throughout the lungs bilaterally, predominantly in the periphery in a peribronchovascular distribution, favored to represent areas of mucoid impaction within terminal bronchioles. No larger more suspicious appearing pulmonary nodules or masses are otherwise noted. No acute consolidative airspace disease. No pleural effusions.  Musculoskeletal: There are no aggressive appearing lytic or blastic lesions noted in the visualized portions of the skeleton.  CT ABDOMEN AND PELVIS FINDINGS  Hepatobiliary: There are 3 tiny low-attenuation liver lesions, largest of which is a 7 mm low attenuation lesion in segment 4A. No other suspicious appearing hepatic lesions. No intrahepatic or extrahepatic biliary ductal dilatation. Gallbladder is normal in appearance.  Pancreas: Unremarkable.  Spleen: Sub cm low-attenuation lesions in the spleen are incompletely characterized. However, 1 of these normalizes on the delayed phase images, and is presumably related to early perfusion (the other lesion was incompletely visualized, but similar appearance, also favored to be related to perfusion).  Adrenals/Urinary Tract: Diffuse thickening of the adrenal glands, similar to prior examinations, presumably adenomatous hyperplasia. Bilateral kidneys are normal in appearance. No hydroureteronephrosis.  Urinary bladder is normal in appearance.  Stomach/Bowel: The appearance of the stomach is normal. No pathologic dilatation of small bowel or colon. There is extensive masslike thickening of the colonic wall in the cecum, which extends through the ileocecal valve into the distal ileum. Notably, given the marked wall thickening in this region, the lumen appears relatively preserved. Image 84 of series 2 demonstrates slight haziness in the pericolonic fat lateral to the cecum. Several borderline enlarged and mildly enlarged ileocolic lymph nodes are noted measuring up to 11 mm.  Vascular/Lymphatic: Mild atherosclerotic disease is noted throughout the abdominal and pelvic vasculature, without definite aneurysm. Several borderline enlarged and mildly enlarged ileocolic lymph nodes measuring up to 11 mm are noted. No other definite lymphadenopathy identified in the abdomen or pelvis.  Reproductive: Uterus and ovaries are unremarkable in appearance.  Other: No significant volume of ascites.  No pneumoperitoneum.  Musculoskeletal: There are no aggressive appearing lytic or blastic lesions noted in the visualized portions of the skeleton.  IMPRESSION: 1. Masslike thickening of the cecum which appears to go retrograde through the ileocecal valve into the distal ileum. This has been seen to some extent or another on prior examinations dating back to 09/05/2008. This remains concerning for neoplasm (potentially either a colonic adenocarcinoma or lymphoma), however, it is notable for relatively slow growth. Today's examination demonstrates some haziness in the pericolonic fat lateral to the lesion, and some borderline enlarged and mildly enlarged ileocolic lymph nodes. No other definite signs of metastatic disease are noted in the chest, abdomen or pelvis. 2. There are several nonspecific liver lesions. These have a relatively benign appearance, favored to represent tiny cysts, but are technically indeterminate. These could be  definitively characterized with MRI of the abdomen with and without IV gadolinium if clinically appropriate. 3. There also multiple tiny pulmonary nodules scattered throughout the lungs bilaterally, which are highly nonspecific and favored to be benign, likely areas of mucoid impaction within terminal bronchioles. Attention on future followup studies is recommended. 4. Unchanged adrenal thickening bilaterally, most compatible with adenomatous hyperplasia. 5. Additional incidental findings, as above.   Electronically Signed   By: Mauri Brooklyn.D.  On: 01/10/2014 17:24     Subjective: Pt is alert, follows commands appropriately, not in acute distress; feeling better and reports BM X2 and flatus. Patient has tolerated mechanical soft diet.  Objective: Filed Vitals:   01/16/14 0603 01/16/14 0753 01/16/14 1115 01/16/14 1407  BP: 139/71  145/73 156/76  Pulse: 75   80  Temp: 98.2 F (36.8 C)   98.1 F (36.7 C)  TempSrc: Oral   Oral  Resp: 20 16  18   Height:      Weight:      SpO2: 100% 100%  100%    Intake/Output Summary (Last 24 hours) at 01/16/14 1604 Last data filed at 01/16/14 1500  Gross per 24 hour  Intake    700 ml  Output      1 ml  Net    699 ml   Weight change:  Exam:   General:  Pt is alert, follows commands appropriately, not in acute distress; feeling better and reports BM X2 and flatus. Patient has tolerated mechanical soft diet.  HEENT: No icterus, No thrush,  [place]/AT  Cardiovascular: RRR, S1/S2, no rubs, no gallops  Respiratory: CTA bilaterally, no wheezing, no crackles, no rhonchi  Abdomen: Soft/+BS, mild tenderness without any peritoneal signs , non distended, no guarding; incision looking good.  Extremities: No edema, No lymphangitis, No petechiae, No rashes, no synovitis  Data Reviewed: Basic Metabolic Panel:  Recent Labs Lab 01/12/14 0610 01/13/14 0454 01/14/14 0450 01/15/14 0500 01/16/14 0414  NA 137 140 137 139 137  K 3.8 3.7 3.5* 3.7 3.8    CL 105 105 102 105 102  CO2 24 25 23 25 24   GLUCOSE 117* 119* 115* 101* 101*  BUN 10 6 6 7 7   CREATININE 1.10 1.08 1.04 1.03 0.98  CALCIUM 8.9 8.8 8.7 8.5 8.4  MG 1.9  --   --   --  1.8   CBC:  Recent Labs Lab 01/11/14 0450 01/12/14 0610 01/13/14 0454 01/14/14 0450 01/16/14 0414  WBC 6.2 13.8* 9.9 8.1 6.5  HGB 8.2* 8.3* 8.2* 8.4* 8.0*  HCT 28.6* 29.1* 29.4* 30.1* 28.6*  MCV 74.3* 74.4* 75.4* 75.4* 76.1*  PLT 283 272 263 306 318   CBG:  Recent Labs Lab 01/09/14 1837  GLUCAP 104*    Recent Results (from the past 240 hour(s))  MRSA PCR Screening     Status: None   Collection Time: 01/11/14  7:21 AM  Result Value Ref Range Status   MRSA by PCR NEGATIVE NEGATIVE Final    Comment:        The GeneXpert MRSA Assay (FDA approved for NASAL specimens only), is one component of a comprehensive MRSA colonization surveillance program. It is not intended to diagnose MRSA infection nor to guide or monitor treatment for MRSA infections.      Scheduled Meds: . amLODipine  10 mg Oral Daily  . Chlorhexidine Gluconate Cloth  6 each Topical Q0600  . enoxaparin (LOVENOX) injection  40 mg Subcutaneous Q24H  . nicotine  14 mg Transdermal Daily  . pantoprazole (PROTONIX) IV  40 mg Intravenous QHS   Continuous Infusions: . dextrose 5 % and 0.45 % NaCl with KCl 20 mEq/L 10 mL/hr at 01/16/14 4081    Time: 30 minutes   Barton Dubois, MD  [facility] Pager [phone]  If 7PM-7AM, please contact night-coverage [email or web address] Password [email or web address] 01/16/2014, 4:04 PM   LOS: 7 days

## 2014-01-17 DIAGNOSIS — K2100 Gastro-esophageal reflux disease with esophagitis, without bleeding: Secondary | ICD-10-CM | POA: Insufficient documentation

## 2014-01-17 MED ORDER — HYDROCODONE-ACETAMINOPHEN 5-325 MG PO TABS: 1.0000 | ORAL_TABLET | ORAL | Status: DC | PRN

## 2014-01-17 MED ORDER — NICOTINE 14 MG/24HR TD PT24: 14.0000 mg | MEDICATED_PATCH | Freq: Every day | TRANSDERMAL | Status: DC

## 2014-01-17 NOTE — Discharge Summary (Signed)
Physician Discharge Summary  Gail Carlson FYB:[id number] DOB: 07/26/1950 DOA: 01/09/2014  PCP: No PCP Per Patient  Admit date: 01/09/2014 Discharge date: 01/17/2014  Time spent: >30 minutes  Recommendations for Outpatient Follow-up:  Reassess BP and adjust antihypertensive regimen as needed Check CBC to follow Hgb trend Check BMET to follow electrolytes and renal function  Discharge Diagnoses:  Active Problems:   Abdominal pain   Dysphagia, pharyngoesophageal phase   HTN (hypertension)   Abnormal CT of the abdomen   Tobacco use disorder   Colonic mass   Anemia, iron deficiency   Abnormal CT scan, gastrointestinal tract   Esophageal stricture   Colon cancer   Colon adenocarcinoma   Discharge Condition: stable and improved will discharge home. Will follow with general surgery and oncology as an outpatient. Will follow as well with PCP in approx 2 weeks  Diet recommendation: heart healthy/low fat and soft diet  Filed Weights   01/10/14 0839  Weight: 81.784 kg (180 lb 4.8 oz)    History of present illness:  63 year old female with a history of iron deficiency anemia, GERD, hypertension presents with abdominal pain for at least the past 3 years. Patient states that she had an appendectomy approximately 4 years ago after which she began having abdominal pain and discomfort. The patient was diagnosed with iron deficiency anemia by [facility] and started on iron supplementation here she was supposed to take it 3 times a day, but has only been taking it twice a day because of GI distress and constipation. For the past 2-3 months, she has complained of worsening abdominal pain and solid food dysphagia. In addition, the patient has had intermittent nausea and vomiting with certain foods which have also worsened her abdominal discomfort. When she is constipated, she has noted intermittent hematochezia. She denied any fevers, chills, chest pain, shortness of breath, but complains of  generalized fatigue, weakness. She went to see Dr. Silvio Pate on 12/09/2013. Outpatient endoscopy and colonoscopy were planned and blood work was obtained. However her blood work showed that she had hemoglobin 6.1.   Hospital Course:  Symptomatic anemia/iron deficiency anemia -Patient has received 2 units PRBC-->Hgb is stable post-op -also was transfused with IV iron -Restart ferrous sulfate PO -Advised vitamin C to assist with absorption (OTC) -EGD= high-grade distal esophageal stricture dilated -Colonoscopy--high-grade right colon mass; s/p right hemicolectomy  -Appreciate GI evaluation  Colon adenocarcinoma/R-Colon mass/postop ileus -Appreciate general surgery, assistance, inputs and rec's -01/11/2014--right hemicolectomy -colonoscopy pathology= adenocarcinoma; but final results surgical pathology pending  -CEA 1.5- -Will need oncology follow-up after discharge (surgery service, Dr. Harle Stanford will set up appointment for her) -gut function improved enough for patient to be discharged; had regular BM, passing flatus; no vomiting and tolerating diet/meds by mouth   Solid food dysphagia/Esophageal Stricture -EGD 01/11/2014= high-grade distal esophageal stricture dilated -tolerating soft diet -will continue PPI -patient advise to quit smoking  -will follow with GI service on as needed basis  Tobacco abuse -Tobacco cessation discussed -continue NicoDerm patch  Hypertension -Continue amlodipine  -BP partly elevated from pain -advise to follow low sodium diet -BP meds to be adjusted by PCP in case BP continue elevated.  Hypokalemia -Repleted; last K 3.8 (12/17)  Procedures:  Right hemicolectomy (01/11/14)  EGD (high grade distal esophageal stricture and esophagitis)  Consultations:  General surgery  GI  Discharge Exam: Filed Vitals:   01/17/14 1327  BP: 141/76  Pulse: 88  Temp: 98.4 F (36.9 C)  Resp: 18     General: Pt is  alert, follows commands appropriately,  not in acute distress; feeling better and reports 2 more epeisodes of BM and is actively passing flatus. Patient tolerating mechanical soft diet and PO meds  HEENT: No icterus, No thrush, [employer]/AT  Cardiovascular: RRR, S1/S2, no rubs, no gallops  Respiratory: CTA bilaterally, no wheezing, no crackles, no rhonchi  Abdomen: Soft/+BS, mild tenderness without any peritoneal signs , non distended, no guarding; incision looking good.  Extremities: No edema, No lymphangitis, No petechiae, No rashes, no synovitis  Discharge Instructions You were cared for by a hospitalist during your hospital stay. If you have any questions about your discharge medications or the care you received while you were in the hospital after you are discharged, you can call the unit and asked to speak with the hospitalist on call if the hospitalist that took care of you is not available. Once you are discharged, your primary care physician will handle any further medical issues. Please note that NO REFILLS for any discharge medications will be authorized once you are discharged, as it is imperative that you return to your primary care physician (or establish a relationship with a primary care physician if you do not have one) for your aftercare needs so that they can reassess your need for medications and monitor your lab values.  Discharge Instructions    Discharge instructions    Complete by:  As directed   Small meals multiple times a day Keep yourself well hydrated Arrange follow up with PCP in 2 weeks Follow with oncology service and general surgery as instructed Follow a soft, low fat diet          Current Discharge Medication List    START taking these medications   Details  HYDROcodone-acetaminophen (NORCO/VICODIN) 5-325 MG per tablet Take 1-2 tablets by mouth every 4 (four) hours as needed for moderate pain or severe pain. Qty: 40 tablet, Refills: 0    nicotine (NICODERM CQ - DOSED IN MG/24 HOURS) 14 mg/24hr  patch Place 1 patch (14 mg total) onto the skin daily. Qty: 28 patch, Refills: 0      CONTINUE these medications which have NOT CHANGED   Details  amLODipine (NORVASC) 10 MG tablet Take 10 mg by mouth daily.    ferrous sulfate 325 (65 FE) MG tablet Take 1 tablet (325 mg total) by mouth daily with breakfast. Qty: 90 tablet, Refills: 3    ondansetron (ZOFRAN) 4 MG tablet Take 1 tablet (4 mg total) by mouth every 8 (eight) hours as needed for nausea or vomiting. Qty: 30 tablet, Refills: 1    pantoprazole (PROTONIX) 40 MG tablet Take 1 tablet (40 mg total) by mouth daily. Qty: 30 tablet, Refills: 3    predniSONE (DELTASONE) 20 MG tablet Take 40mg  (2 tab) for 1 week; then, 30mg  (1.5 tab) for 1 week; then 20mg  (1 tab) for 1 week, then 10mg  (.5mg ) for 1 week and then stop. Qty: 40 tablet, Refills: 0      STOP taking these medications     MOVIPREP 100 G SOLR        No Known Allergies Follow-up Information    Follow up with Earnstine Regal, MD. Schedule an appointment as soon as possible for a visit in 2 weeks.   Specialty:  General Surgery   Why:  Call for appointment for staple removal next week.   Contact information:   [address] [place] [address] [phone]       The results of significant  diagnostics from this hospitalization (including imaging, microbiology, ancillary and laboratory) are listed below for reference.    Significant Diagnostic Studies: Ct Chest W Contrast  01/10/2014   CLINICAL DATA:  63 year old female with cecal mass recently noted on colonoscopy. Evaluate for metastatic disease.  EXAM: CT CHEST, ABDOMEN, AND PELVIS WITH CONTRAST  TECHNIQUE: Multidetector CT imaging of the chest, abdomen and pelvis was performed following the standard protocol during bolus administration of intravenous contrast.  CONTRAST:  182mL OMNIPAQUE IOHEXOL 300 MG/ML  SOLN  COMPARISON:  CT of the abdomen and pelvis 06/28/2013.  FINDINGS: CT CHEST FINDINGS   Mediastinum: Heart size is mildly enlarged. There is no significant pericardial fluid, thickening or pericardial calcification. No pathologically enlarged mediastinal or hilar lymph nodes. Small hiatal hernia.  Lungs/Pleura: A few scattered 1-2 mm pulmonary nodules are seen throughout the lungs bilaterally, predominantly in the periphery in a peribronchovascular distribution, favored to represent areas of mucoid impaction within terminal bronchioles. No larger more suspicious appearing pulmonary nodules or masses are otherwise noted. No acute consolidative airspace disease. No pleural effusions.  Musculoskeletal: There are no aggressive appearing lytic or blastic lesions noted in the visualized portions of the skeleton.  CT ABDOMEN AND PELVIS FINDINGS  Hepatobiliary: There are 3 tiny low-attenuation liver lesions, largest of which is a 7 mm low attenuation lesion in segment 4A. No other suspicious appearing hepatic lesions. No intrahepatic or extrahepatic biliary ductal dilatation. Gallbladder is normal in appearance.  Pancreas: Unremarkable.  Spleen: Sub cm low-attenuation lesions in the spleen are incompletely characterized. However, 1 of these normalizes on the delayed phase images, and is presumably related to early perfusion (the other lesion was incompletely visualized, but similar appearance, also favored to be related to perfusion).  Adrenals/Urinary Tract: Diffuse thickening of the adrenal glands, similar to prior examinations, presumably adenomatous hyperplasia. Bilateral kidneys are normal in appearance. No hydroureteronephrosis. Urinary bladder is normal in appearance.  Stomach/Bowel: The appearance of the stomach is normal. No pathologic dilatation of small bowel or colon. There is extensive masslike thickening of the colonic wall in the cecum, which extends through the ileocecal valve into the distal ileum. Notably, given the marked wall thickening in this region, the lumen appears relatively  preserved. Image 84 of series 2 demonstrates slight haziness in the pericolonic fat lateral to the cecum. Several borderline enlarged and mildly enlarged ileocolic lymph nodes are noted measuring up to 11 mm.  Vascular/Lymphatic: Mild atherosclerotic disease is noted throughout the abdominal and pelvic vasculature, without definite aneurysm. Several borderline enlarged and mildly enlarged ileocolic lymph nodes measuring up to 11 mm are noted. No other definite lymphadenopathy identified in the abdomen or pelvis.  Reproductive: Uterus and ovaries are unremarkable in appearance.  Other: No significant volume of ascites.  No pneumoperitoneum.  Musculoskeletal: There are no aggressive appearing lytic or blastic lesions noted in the visualized portions of the skeleton.  IMPRESSION: 1. Masslike thickening of the cecum which appears to go retrograde through the ileocecal valve into the distal ileum. This has been seen to some extent or another on prior examinations dating back to 09/05/2008. This remains concerning for neoplasm (potentially either a colonic adenocarcinoma or lymphoma), however, it is notable for relatively slow growth. Today's examination demonstrates some haziness in the pericolonic fat lateral to the lesion, and some borderline enlarged and mildly enlarged ileocolic lymph nodes. No other definite signs of metastatic disease are noted in the chest, abdomen or pelvis. 2. There are several nonspecific liver lesions. These have a relatively benign  appearance, favored to represent tiny cysts, but are technically indeterminate. These could be definitively characterized with MRI of the abdomen with and without IV gadolinium if clinically appropriate. 3. There also multiple tiny pulmonary nodules scattered throughout the lungs bilaterally, which are highly nonspecific and favored to be benign, likely areas of mucoid impaction within terminal bronchioles. Attention on future followup studies is recommended. 4.  Unchanged adrenal thickening bilaterally, most compatible with adenomatous hyperplasia. 5. Additional incidental findings, as above.   Electronically Signed   By: Vinnie Langton M.D.   On: 01/10/2014 17:24   Ct Abdomen Pelvis W Contrast  01/10/2014   CLINICAL DATA:  63 year old female with cecal mass recently noted on colonoscopy. Evaluate for metastatic disease.  EXAM: CT CHEST, ABDOMEN, AND PELVIS WITH CONTRAST  TECHNIQUE: Multidetector CT imaging of the chest, abdomen and pelvis was performed following the standard protocol during bolus administration of intravenous contrast.  CONTRAST:  198mL OMNIPAQUE IOHEXOL 300 MG/ML  SOLN  COMPARISON:  CT of the abdomen and pelvis 06/28/2013.  FINDINGS: CT CHEST FINDINGS  Mediastinum: Heart size is mildly enlarged. There is no significant pericardial fluid, thickening or pericardial calcification. No pathologically enlarged mediastinal or hilar lymph nodes. Small hiatal hernia.  Lungs/Pleura: A few scattered 1-2 mm pulmonary nodules are seen throughout the lungs bilaterally, predominantly in the periphery in a peribronchovascular distribution, favored to represent areas of mucoid impaction within terminal bronchioles. No larger more suspicious appearing pulmonary nodules or masses are otherwise noted. No acute consolidative airspace disease. No pleural effusions.  Musculoskeletal: There are no aggressive appearing lytic or blastic lesions noted in the visualized portions of the skeleton.  CT ABDOMEN AND PELVIS FINDINGS  Hepatobiliary: There are 3 tiny low-attenuation liver lesions, largest of which is a 7 mm low attenuation lesion in segment 4A. No other suspicious appearing hepatic lesions. No intrahepatic or extrahepatic biliary ductal dilatation. Gallbladder is normal in appearance.  Pancreas: Unremarkable.  Spleen: Sub cm low-attenuation lesions in the spleen are incompletely characterized. However, 1 of these normalizes on the delayed phase images, and is  presumably related to early perfusion (the other lesion was incompletely visualized, but similar appearance, also favored to be related to perfusion).  Adrenals/Urinary Tract: Diffuse thickening of the adrenal glands, similar to prior examinations, presumably adenomatous hyperplasia. Bilateral kidneys are normal in appearance. No hydroureteronephrosis. Urinary bladder is normal in appearance.  Stomach/Bowel: The appearance of the stomach is normal. No pathologic dilatation of small bowel or colon. There is extensive masslike thickening of the colonic wall in the cecum, which extends through the ileocecal valve into the distal ileum. Notably, given the marked wall thickening in this region, the lumen appears relatively preserved. Image 84 of series 2 demonstrates slight haziness in the pericolonic fat lateral to the cecum. Several borderline enlarged and mildly enlarged ileocolic lymph nodes are noted measuring up to 11 mm.  Vascular/Lymphatic: Mild atherosclerotic disease is noted throughout the abdominal and pelvic vasculature, without definite aneurysm. Several borderline enlarged and mildly enlarged ileocolic lymph nodes measuring up to 11 mm are noted. No other definite lymphadenopathy identified in the abdomen or pelvis.  Reproductive: Uterus and ovaries are unremarkable in appearance.  Other: No significant volume of ascites.  No pneumoperitoneum.  Musculoskeletal: There are no aggressive appearing lytic or blastic lesions noted in the visualized portions of the skeleton.  IMPRESSION: 1. Masslike thickening of the cecum which appears to go retrograde through the ileocecal valve into the distal ileum. This has been seen to some extent or  another on prior examinations dating back to 09/05/2008. This remains concerning for neoplasm (potentially either a colonic adenocarcinoma or lymphoma), however, it is notable for relatively slow growth. Today's examination demonstrates some haziness in the pericolonic fat  lateral to the lesion, and some borderline enlarged and mildly enlarged ileocolic lymph nodes. No other definite signs of metastatic disease are noted in the chest, abdomen or pelvis. 2. There are several nonspecific liver lesions. These have a relatively benign appearance, favored to represent tiny cysts, but are technically indeterminate. These could be definitively characterized with MRI of the abdomen with and without IV gadolinium if clinically appropriate. 3. There also multiple tiny pulmonary nodules scattered throughout the lungs bilaterally, which are highly nonspecific and favored to be benign, likely areas of mucoid impaction within terminal bronchioles. Attention on future followup studies is recommended. 4. Unchanged adrenal thickening bilaterally, most compatible with adenomatous hyperplasia. 5. Additional incidental findings, as above.   Electronically Signed   By: Vinnie Langton M.D.   On: 01/10/2014 17:24    Microbiology: Recent Results (from the past 240 hour(s))  MRSA PCR Screening     Status: None   Collection Time: 01/11/14  7:21 AM  Result Value Ref Range Status   MRSA by PCR NEGATIVE NEGATIVE Final    Comment:        The GeneXpert MRSA Assay (FDA approved for NASAL specimens only), is one component of a comprehensive MRSA colonization surveillance program. It is not intended to diagnose MRSA infection nor to guide or monitor treatment for MRSA infections.      Labs: Basic Metabolic Panel:  Recent Labs Lab 01/12/14 0610 01/13/14 0454 01/14/14 0450 01/15/14 0500 01/16/14 0414  NA 137 140 137 139 137  K 3.8 3.7 3.5* 3.7 3.8  CL 105 105 102 105 102  CO2 24 25 23 25 24   GLUCOSE 117* 119* 115* 101* 101*  BUN 10 6 6 7 7   CREATININE 1.10 1.08 1.04 1.03 0.98  CALCIUM 8.9 8.8 8.7 8.5 8.4  MG 1.9  --   --   --  1.8   CBC:  Recent Labs Lab 01/11/14 0450 01/12/14 0610 01/13/14 0454 01/14/14 0450 01/16/14 0414  WBC 6.2 13.8* 9.9 8.1 6.5  HGB 8.2* 8.3*  8.2* 8.4* 8.0*  HCT 28.6* 29.1* 29.4* 30.1* 28.6*  MCV 74.3* 74.4* 75.4* 75.4* 76.1*  PLT 283 272 263 306 318    Signed:  Barton Dubois  [facility] 01/17/2014, 2:35 PM

## 2014-01-17 NOTE — Progress Notes (Signed)
6 Days Post-Op  Subjective: Tolerating diet.  Bowels moving.  Ready to go home.  Objective: Vital signs in last 24 hours: Temp:  [98.1 F (36.7 C)-98.5 F (36.9 C)] 98.5 F (36.9 C) (12/18 0445) Pulse Rate:  [72-80] 80 (12/18 0445) Resp:  [18] 18 (12/18 0445) BP: (127-156)/(59-76) 153/63 mmHg (12/18 0901) SpO2:  [98 %-100 %] 98 % (12/18 0445) Last BM Date: 01/17/14  Intake/Output from previous day: 12/17 0701 - 12/18 0700 In: 970 [P.O.:600; I.V.:370] Out: 250 [Urine:250] Intake/Output this shift:    PE: General- In NAD Abdomen-soft, incision clean and intact  Lab Results:   Recent Labs  01/16/14 0414  WBC 6.5  HGB 8.0*  HCT 28.6*  PLT 318   BMET  Recent Labs  01/15/14 0500 01/16/14 0414  NA 139 137  K 3.7 3.8  CL 105 102  CO2 25 24  GLUCOSE 101* 101*  BUN 7 7  CREATININE 1.03 0.98  CALCIUM 8.5 8.4   PT/INR No results for input(s): LABPROT, INR in the last 72 hours. Comprehensive Metabolic Panel:    Component Value Date/Time   NA 137 01/16/2014 0414   NA 139 01/15/2014 0500   K 3.8 01/16/2014 0414   K 3.7 01/15/2014 0500   CL 102 01/16/2014 0414   CL 105 01/15/2014 0500   CO2 24 01/16/2014 0414   CO2 25 01/15/2014 0500   BUN 7 01/16/2014 0414   BUN 7 01/15/2014 0500   CREATININE 0.98 01/16/2014 0414   CREATININE 1.03 01/15/2014 0500   GLUCOSE 101* 01/16/2014 0414   GLUCOSE 101* 01/15/2014 0500   CALCIUM 8.4 01/16/2014 0414   CALCIUM 8.5 01/15/2014 0500   AST 12 01/09/2014 1210   AST 10 11/04/2013 2148   ALT <5 01/09/2014 1210   ALT 7 11/04/2013 2148   ALKPHOS 89 01/09/2014 1210   ALKPHOS 102 11/04/2013 2148   BILITOT <0.2* 01/09/2014 1210   BILITOT <0.2* 11/04/2013 2148   PROT 6.9 01/09/2014 1210   PROT 7.6 11/04/2013 2148   ALBUMIN 3.2* 01/09/2014 1210   ALBUMIN 3.5 11/04/2013 2148     Studies/Results: No results found.  Anti-infectives: Anti-infectives    Start     Dose/Rate Route Frequency Ordered Stop   01/11/14 0600   cefOXitin (MEFOXIN) 2 g in dextrose 5 % 50 mL IVPB     2 g100 mL/hr over 30 Minutes Intravenous On call to O.R. 01/10/14 1822 01/11/14 1006      Assessment Cecal mass with near obstruction. Path showing Adenocarcinoma S/p Right colectomy, 01/11/14 Dr. Alisa Graff well   LOS: 8 days   Plan: Can be discharged today from our standpoint. Will follow up in office next week.  Discharge instructions discussed with her.   Romie Keeble J 01/17/2014

## 2014-01-17 NOTE — Progress Notes (Signed)
Pt. Received D/C instructions and states that she understands them. She is being D/C in stable condition with family.

## 2014-01-17 NOTE — Discharge Instructions (Signed)
[facility], [place] [phone]  OPEN ABDOMINAL SURGERY: POST OP INSTRUCTIONS  Always review your discharge instruction sheet given to you by the facility where your surgery was performed.  IF YOU HAVE DISABILITY OR FAMILY LEAVE FORMS, YOU MUST BRING THEM TO THE OFFICE FOR PROCESSING.  PLEASE DO NOT GIVE THEM TO YOUR DOCTOR.  1. A prescription for pain medication may be given to you upon discharge.  Take your pain medication as prescribed, if needed.  If narcotic pain medicine is not needed, then you may take acetaminophen (Tylenol) or ibuprofen (Advil) as needed. 2. Take your usually prescribed medications unless otherwise directed. 3. If you need a refill on your pain medication, please contact your pharmacy. They will contact our office to request authorization.  Prescriptions will not be filled after 5pm or on week-ends. 4. You should follow a lowfat diet. It is common to experience some constipation if taking pain medication after surgery.  Increasing fluid intake and taking a stool softener will usually help or prevent this problem from occurring.  A mild laxative (Milk of Magnesia or Miralax) should be taken according to package directions if there are no bowel movements after 48 hours. 5.  You may have steri-strips (small skin tapes) in place directly over the incision.  These strips should be left on the skin for 7-10 days.  If your surgeon used skin glue on the incision, you may shower in 24 hours.  The glue will flake off over the next 2-3 weeks.  Any sutures or staples will be removed at the office during your follow-up visit. You may find that a light gauze bandage over your incision may keep your staples from being rubbed or pulled. You may shower and replace the bandage daily. 6. ACTIVITIES:  You may resume regular (light) daily activities beginning the next day--such as daily self-care, walking, climbing stairs--gradually increasing activities as tolerated.  You may  have sexual intercourse when it is comfortable.  Refrain from any heavy lifting or straining for 6 weeks.  a. You may drive when you no longer are taking prescription pain medication, you can comfortably wear a seatbelt, and you can safely maneuver your car and apply brakes b. Return to Work: ___________________________________ 7. You should see your doctor in the office for a follow-up appointment approximately two weeks after your surgery.  Make sure that you call for this appointment within a day or two after you arrive home to insure a convenient appointment time. OTHER INSTRUCTIONS:  _____________________________________________________________ _____________________________________________________________  WHEN TO CALL YOUR DOCTOR: 1. Fever over 101.0 2. Inability to urinate 3. Nausea and/or vomiting 4. Extreme swelling or bruising 5. Continued bleeding from incision. 6. Increased pain, redness, or drainage from the incision  The clinic staff is available to answer your questions during regular business hours.  Please dont hesitate to call and ask to speak to one of the nurses if you have concerns.  For further questions, please visit [email or web address]

## 2014-01-21 ENCOUNTER — Telehealth: Payer: Self-pay | Admitting: Oncology

## 2014-01-21 NOTE — Telephone Encounter (Signed)
S/W PATIENT AND GAVE NP APPT FOR 12/29 @ 1:30 W/DR. SHERRILL

## 2014-01-21 NOTE — Telephone Encounter (Signed)
PATIENT CALLED TO R/S NP APPT TO 1/11 @ 1:30 W/DR. SHERRILL.  NURSE/MD AWARE

## 2014-01-22 ENCOUNTER — Telehealth: Payer: Self-pay | Admitting: Oncology

## 2014-01-22 NOTE — Telephone Encounter (Signed)
Delivered chart 01/22/14 °

## 2014-01-23 ENCOUNTER — Encounter (HOSPITAL_COMMUNITY): Payer: Self-pay

## 2014-01-28 ENCOUNTER — Ambulatory Visit: Payer: Medicare Other | Admitting: Oncology

## 2014-01-28 ENCOUNTER — Ambulatory Visit: Payer: Medicare Other

## 2014-02-10 ENCOUNTER — Telehealth: Payer: Self-pay | Admitting: Oncology

## 2014-02-10 ENCOUNTER — Ambulatory Visit: Payer: Medicare Other

## 2014-02-10 ENCOUNTER — Encounter: Payer: Self-pay | Admitting: Oncology

## 2014-02-10 ENCOUNTER — Ambulatory Visit (HOSPITAL_BASED_OUTPATIENT_CLINIC_OR_DEPARTMENT_OTHER): Payer: Medicare Other | Admitting: Oncology

## 2014-02-10 VITALS — BP 195/97 | HR 73 | Temp 98.1°F | Resp 19 | Ht 62.0 in | Wt 175.1 lb

## 2014-02-10 DIAGNOSIS — Z803 Family history of malignant neoplasm of breast: Secondary | ICD-10-CM

## 2014-02-10 DIAGNOSIS — C180 Malignant neoplasm of cecum: Secondary | ICD-10-CM

## 2014-02-10 DIAGNOSIS — C189 Malignant neoplasm of colon, unspecified: Secondary | ICD-10-CM

## 2014-02-10 DIAGNOSIS — D509 Iron deficiency anemia, unspecified: Secondary | ICD-10-CM

## 2014-02-10 NOTE — Telephone Encounter (Signed)
gv adn printed appt sched and avs for pt for Jan and Feb....sed added tx. °

## 2014-02-10 NOTE — Progress Notes (Signed)
Checked in new pt with no financial concerns prior to seeing the dr.  Pt has Raquel's card for any billing or insurance questions or concerns.  ° °

## 2014-02-10 NOTE — Telephone Encounter (Signed)
lvm for Stephanie at [facility] to sched pt for port placement i advised her to call me and i will call pt

## 2014-02-10 NOTE — Progress Notes (Signed)
Gail Carlson   Referring MD: Gail Carlson 64 y.o.  October 24, 1950    Reason for Referral: Colon cancer   HPI: She was referred to Gail Carlson with right abdominal pain and solid dysphagia in December. She was found to have a hemoglobin of 6.1 and was admitted for further evaluation. CTs of the chest, abdomen, and pelvis on 01/10/2014 revealed one to 2 mm scattered lung nodules favored to represent areas of mucoid impaction. 3 tiny low-attenuation liver lesions, largest 7 mm. Extensive masslike thickening of the colonic wall at the cecum extending into the distal ileum. Borderline enlarged and mildly enlarged ileocolic lymph nodes.  Gail Carlson was Carlson at and she was taken to an upper endoscopy on 01/10/2014. Gastroesophageal reflux disease was noted with a high-grade distal esophageal stricture. This was dilated.    She was transfused with packed of blood cells. Gail Carlson was Carlson at and she was taken to the operating room on 01/11/2014 for a right colectomy. A large mass was noted in the cecum. A colonoscopy revealed a high-grade obstructive mass in the proximal right colon. Biopsies were obtained. A 5 mm polyp was removed from the descending colon. The pathology from the ascending colon biopsy revealed adenocarcinoma. The descending biopsy revealed a tubular adenoma without high-grade dysplasia or malignancy.  The pathology [id number]) revealed an invasive adenocarcinoma with mucinous features, moderately differentiated. The tumor was located at the cecum. No macroscopic tumor perforation. Tumor invaded the pericolonic tissue. Perineural invasion was present. 2 tumor deposits were noted. One of 12 lymph nodes containing metastatic carcinoma. The resection margins were negative. The tumor returned microsatellite stable.  She reports intermittent right lower abdominal pain following surgery.    Past Medical History  Diagnosis Date    . Hypertension   . Anemia-iron deficiency   December 2015     .  G2 P2  Past Surgical History  Procedure Laterality Date  . Appendectomy    . Esophagogastroduodenoscopy N/A 01/10/2014    Procedure: ESOPHAGOGASTRODUODENOSCOPY (EGD);  Surgeon: Gail Shipper, MD;  Location: [facility];  Service: Endoscopy;  Laterality: N/A;  . Colonoscopy N/A 01/10/2014    Procedure: COLONOSCOPY;  Surgeon: Gail Shipper, MD;  Location: [facility];  Service: Endoscopy;  Laterality: N/A;  . Savory dilation N/A 01/10/2014    Procedure: SAVORY DILATION;  Surgeon: Gail Shipper, MD;  Location: [facility];  Service: Endoscopy;  Laterality: N/A;  Please verify with Gail Carlson what type of dilation he will use  . Laparotomy N/A 01/11/2014    Procedure: EXPLORATORY LAPAROTOMY;  Surgeon: Gail Gemma, MD;  Location: [facility];  Service: General;  Laterality: N/A;  . Partial colectomy N/A 01/11/2014    Procedure: PARTIAL COLECTOMY;  Surgeon: Gail Gemma, MD;  Location: [facility];  Service: General;  Laterality: N/A;    .   Removal of a "cyst "from the foot  Medications: Reviewed  Allergies: No Known Allergies  Family history: Her mother had breast cancer, 2 maternal aunts had breast cancer, 2 maternal first cousins had breast cancer. No other family history of cancer  Social History:   She lives alone in [place]. 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 She smokes cigarettes. She drinks I'll call occasionally. No risk factor for HIV or hepatitis.    ROS:   Positives include: Alopecia, malaise, dyspnea prior to surgery-improved, occasional rectal bleeding, sharp pain in the right abdomen following surgery  A complete ROS was otherwise negative.  Physical Exam:  Blood pressure 195/97, pulse 73, temperature 98.1 F (36.7 C), temperature source Oral, resp. rate 19, height 5' 2" (1.575 m), weight 175 lb 1.6 oz (79.425 kg), SpO2 100 %.  HEENT: Oropharynx without visible mass, neck  without mass Lungs: Clear bilaterally Cardiac: Regular rate and rhythm Abdomen: No hepatomegaly, healed midline incision, nontender, no mass  Vascular: No leg edema Lymph nodes: No cervical, supra-clavicular, axillary, or inguinal nodes Neurologic: Alert and oriented, the motor exam appears intact in the upper and lower extremities Skin: No rash    LAB:  CBC  Lab Results  Component Value Date   WBC 6.5 01/16/2014   HGB 8.0* 01/16/2014   HCT 28.6* 01/16/2014   MCV 76.1* 01/16/2014   PLT 318 01/16/2014   NEUTROABS 6.3 01/08/2014     CMP      Component Value Date/Time   NA 137 01/16/2014 0414   K 3.8 01/16/2014 0414   CL 102 01/16/2014 0414   CO2 24 01/16/2014 0414   GLUCOSE 101* 01/16/2014 0414   BUN 7 01/16/2014 0414   CREATININE 0.98 01/16/2014 0414   CALCIUM 8.4 01/16/2014 0414   PROT 6.9 01/09/2014 1210   ALBUMIN 3.2* 01/09/2014 1210   AST 12 01/09/2014 1210   ALT <5 01/09/2014 1210   ALKPHOS 89 01/09/2014 1210   BILITOT <0.2* 01/09/2014 1210   GFRNONAA 59* 01/16/2014 0414   GFRAA 69* 01/16/2014 0414    Lab Results  Component Value Date   CEA 1.5 01/11/2014     Assessment/Carlson:   1. 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  CTs of the chest, abdomen, and pelvis on 01/10/2014 with a cecal mass, nonspecific small liver lesions, and nonspecific lung nodules felt to most likely represent mucoid impaction  2. Iron deficiency anemia  3.   Family history of breast cancer   Disposition:   Gail Carlson has been diagnosed with stage III colon cancer. I discussed the prognosis and adjuvant treatment options with her today. We reviewed the details of the surgical pathology report. I explained the benefit associated with adjuvant 5-fluorouracil and oxaliplatin chemotherapy in patients with resected  stage III colon cancer. We discussed FOLFOX and CAPOX chemotherapy. I explained the equivalence of these regimens with regard to colon cancer recurrence. She feels most comfortable with the FOLFOX regimen.  We reviewed the potential toxicities associated with FOLFOX including the chance for nausea/vomiting, mucositis, diarrhea, alopecia, and hematologic toxicity. We discussed the rash, hyperpigmentation, and hand/foot syndrome seen with 5 fluorouracil. We discussed the various types of neuropathy associated with oxaliplatin. She agrees to proceed.  I will refer Gail Carlson to Dr. [name] for placement of a Port-A-Cath with the Carlson to begin FOLFOX chemotherapy on 02/20/2013. She will be referred for a chemotherapy teaching class.  Approximately 50 minutes were spent with patient today. The majority of the time was used for counseling and coordination of care.  Gail Carlson, Gail Carlson 02/10/2014, 2:31 PM    

## 2014-02-11 ENCOUNTER — Telehealth: Payer: Self-pay | Admitting: *Deleted

## 2014-02-11 NOTE — Telephone Encounter (Signed)
Per Dr. Benay Spice; spoke to [facility] in pathology requesting IHC testing to be added to pathology from colon resection.  Kim verbalized understanding and states she would add.

## 2014-02-12 ENCOUNTER — Encounter: Payer: Self-pay | Admitting: *Deleted

## 2014-02-12 ENCOUNTER — Other Ambulatory Visit (HOSPITAL_BASED_OUTPATIENT_CLINIC_OR_DEPARTMENT_OTHER): Payer: Medicare Other

## 2014-02-12 ENCOUNTER — Ambulatory Visit (INDEPENDENT_AMBULATORY_CARE_PROVIDER_SITE_OTHER): Payer: Self-pay | Admitting: Surgery

## 2014-02-12 ENCOUNTER — Other Ambulatory Visit: Payer: Medicare Other

## 2014-02-12 ENCOUNTER — Other Ambulatory Visit: Payer: Self-pay | Admitting: *Deleted

## 2014-02-12 DIAGNOSIS — D509 Iron deficiency anemia, unspecified: Secondary | ICD-10-CM

## 2014-02-12 DIAGNOSIS — C180 Malignant neoplasm of cecum: Secondary | ICD-10-CM

## 2014-02-12 DIAGNOSIS — C189 Malignant neoplasm of colon, unspecified: Secondary | ICD-10-CM

## 2014-02-12 LAB — COMPREHENSIVE METABOLIC PANEL (CC13)
ALK PHOS: 95 U/L (ref 40–150)
ALT: 6 U/L (ref 0–55)
AST: 13 U/L (ref 5–34)
Albumin: 3.9 g/dL (ref 3.5–5.0)
Anion Gap: 8 mEq/L (ref 3–11)
BILIRUBIN TOTAL: 0.28 mg/dL (ref 0.20–1.20)
BUN: 13.2 mg/dL (ref 7.0–26.0)
CO2: 29 meq/L (ref 22–29)
CREATININE: 1.1 mg/dL (ref 0.6–1.1)
Calcium: 9 mg/dL (ref 8.4–10.4)
Chloride: 108 mEq/L (ref 98–109)
EGFR: 61 mL/min/{1.73_m2} — ABNORMAL LOW (ref >90)
GLUCOSE: 95 mg/dL (ref 70–140)
Potassium: 4 mEq/L (ref 3.5–5.1)
Sodium: 145 mEq/L (ref 136–145)
Total Protein: 7.2 g/dL (ref 6.4–8.3)

## 2014-02-12 LAB — CBC WITH DIFFERENTIAL/PLATELET
BASO%: 0.7 % (ref 0.0–2.0)
Basophils Absolute: 0 10*3/uL (ref 0.0–0.1)
EOS%: 3.3 % (ref 0.0–7.0)
Eosinophils Absolute: 0.2 10*3/uL (ref 0.0–0.5)
HCT: 37.2 % (ref 34.8–46.6)
HEMOGLOBIN: 10.5 g/dL — AB (ref 11.6–15.9)
LYMPH%: 26.8 % (ref 14.0–49.7)
MCH: 21.3 pg — ABNORMAL LOW (ref 25.1–34.0)
MCHC: 28.2 g/dL — AB (ref 31.5–36.0)
MCV: 75.6 fL — ABNORMAL LOW (ref 79.5–101.0)
MONO#: 0.3 10*3/uL (ref 0.1–0.9)
MONO%: 5.9 % (ref 0.0–14.0)
NEUT%: 63.3 % (ref 38.4–76.8)
NEUTROS ABS: 3.6 10*3/uL (ref 1.5–6.5)
Platelets: 294 10*3/uL (ref 145–400)
RBC: 4.92 10*6/uL (ref 3.70–5.45)
RDW: 21.5 % — ABNORMAL HIGH (ref 11.2–14.5)
WBC: 5.7 10*3/uL (ref 3.9–10.3)
lymph#: 1.5 10*3/uL (ref 0.9–3.3)

## 2014-02-12 MED ORDER — PROCHLORPERAZINE MALEATE 10 MG PO TABS: 10.0000 mg | ORAL_TABLET | Freq: Four times a day (QID) | ORAL | Status: DC | PRN

## 2014-02-12 MED ORDER — LIDOCAINE-PRILOCAINE 2.5-2.5 % EX CREA: TOPICAL_CREAM | CUTANEOUS | Status: DC

## 2014-02-13 ENCOUNTER — Telehealth: Payer: Self-pay | Admitting: Oncology

## 2014-02-13 ENCOUNTER — Encounter (HOSPITAL_BASED_OUTPATIENT_CLINIC_OR_DEPARTMENT_OTHER): Payer: Self-pay | Admitting: *Deleted

## 2014-02-13 NOTE — Progress Notes (Signed)
Labs done 02/12/14

## 2014-02-13 NOTE — Telephone Encounter (Signed)
Gail Carlson from [facility] lv that pt has appt with Dr. [name] on 1.19 @2 :30pm and pt is aware and she has mailed out forms to pt

## 2014-02-16 ENCOUNTER — Other Ambulatory Visit: Payer: Self-pay | Admitting: Oncology

## 2014-02-17 ENCOUNTER — Encounter (HOSPITAL_BASED_OUTPATIENT_CLINIC_OR_DEPARTMENT_OTHER): Payer: Self-pay | Admitting: Surgery

## 2014-02-17 NOTE — H&P (Signed)
[facility]  Gail Carlson Neither DOB: 1950-04-16 Widowed / Language: English / Race: Black or African American Female  History of Present Illness  Patient words: post-op.  The patient is a 64 year old female presenting for a post-operative visit. Patient returns for her first postoperative visit having undergone right colectomy on January 11, 2014. Final pathology shows an invasive adenocarcinoma with mucinous features, moderately differentiated, measuring 8 cm in size. Surgical margins were negative. 12 lymph nodes were examined and one lymph node contained metastatic carcinoma. Since discharge the patient has done well. She is tolerating a regular diet. She is having normal bowel movements. She denies abdominal pain. Patient is scheduled to see medical oncology in consultation on February 10, 2014.   Other Problems Anxiety Disorder Arthritis Back Pain Colon Cancer Depression Gastric Ulcer Hemorrhoids High blood pressure  Past Surgical History Colon Polyp Removal - Colonoscopy  Diagnostic Studies History Colonoscopy within last year Mammogram within last year Pap Smear 1-5 years ago  Allergies  No Known Drug Allergies12/23/2015  Medication History Ferrous Sulfate (325 (65 Fe)MG Tablet, Oral) Active. Hydrocodone-Acetaminophen (5-325MG  Tablet, Oral as needed) Active.  Social History Alcohol use Occasional alcohol use. Caffeine use Coffee. Tobacco use Former smoker.  Family History Arthritis Mother. Breast Cancer Mother. Depression Brother. Diabetes Mellitus Brother, Mother. Heart Disease Mother. Hypertension Mother.  Pregnancy / Birth History Age at menarche 58 years. Age of menopause <43 Gravida 2 Maternal age 74-20 Para 2  Review of Systems  General Not Present- Appetite Loss, Chills, Fatigue, Fever, Night Sweats, Weight Gain and Weight Loss. Skin Not Present- Change in Wart/Mole,  Dryness, Hives, Jaundice, New Lesions, Non-Healing Wounds, Rash and Ulcer. HEENT Not Present- Earache, Hearing Loss, Hoarseness, Nose Bleed, Oral Ulcers, Ringing in the Ears, Seasonal Allergies, Sinus Pain, Sore Throat, Visual Disturbances, Wears glasses/contact lenses and Yellow Eyes. Respiratory Not Present- Bloody sputum, Chronic Cough, Difficulty Breathing, Snoring and Wheezing. Breast Not Present- Breast Mass, Breast Pain, Nipple Discharge and Skin Changes. Cardiovascular Not Present- Chest Pain, Difficulty Breathing Lying Down, Leg Cramps, Palpitations, Rapid Heart Rate, Shortness of Breath and Swelling of Extremities. Gastrointestinal Present- Abdominal Pain, Bloating and Excessive gas. Not Present- Bloody Stool, Change in Bowel Habits, Chronic diarrhea, Constipation, Difficulty Swallowing, Gets full quickly at meals, Hemorrhoids, Indigestion, Nausea, Rectal Pain and Vomiting. Female Genitourinary Not Present- Frequency, Nocturia, Painful Urination, Pelvic Pain and Urgency. Musculoskeletal Present- Back Pain, Joint Pain, Joint Stiffness and Muscle Pain. Not Present- Muscle Weakness and Swelling of Extremities. Neurological Not Present- Decreased Memory, Fainting, Headaches, Numbness, Seizures, Tingling, Tremor, Trouble walking and Weakness. Psychiatric Present- Anxiety, Change in Sleep Pattern and Depression. Not Present- Bipolar, Fearful and Frequent crying. Endocrine Present- Hair Changes and Hot flashes. Not Present- Cold Intolerance, Excessive Hunger, Heat Intolerance and New Diabetes. Hematology Not Present- Easy Bruising, Excessive bleeding, Gland problems, HIV and Persistent Infections.   Vitals  01/22/2014 12:06 PM Weight: 178 lb Height: 62in Body Surface Area: 1.88 m Body Mass Index: 32.56 kg/m Temp.: 72F(Temporal)  Pulse: 75 (Regular)  BP: 128/78 (Sitting, Left Arm, Standard)    Physical Exam The physical exam findings are as follows: Midline abdominal  incision is healed uneventfully. Staples are removed and benzoin and Steri-Strips are applied. There is no sign of infection. There is no sign of herniation. Remainder the abdomen is soft and nontender.    Assessment & Plan  PRIMARY ADENOCARCINOMA OF COLON (153.9  C18.9)  Patient underwent right colectomy on January 11, 2014. Today we reviewed her  pathology. Staples were removed and Steri-Strips were applied. Patient will begin applying topical creams to her incision after the Steri-Strips come off as instructed below.  Patient is scheduled to see medical oncology in consultation on February 10, 2014.  Patient will require infusion port for chemotherapy.  Discussed port placement, risks, and benefits.  Patient wishes to proceed at this time with port placement as an out-patient procedure.  The risks and benefits of the procedure have been discussed at length with the patient.  The patient understands the proposed procedure, potential alternative treatments, and the course of recovery to be expected.  All of the patient's questions have been answered at this time.  The patient wishes to proceed with surgery.  Earnstine Regal, MD, [facility] Office: [phone]

## 2014-02-18 ENCOUNTER — Ambulatory Visit (HOSPITAL_BASED_OUTPATIENT_CLINIC_OR_DEPARTMENT_OTHER)
Admission: RE | Admit: 2014-02-18 | Discharge: 2014-02-18 | Disposition: A | Discharge status: Home / Self Care | Payer: Medicare Other | Source: Ambulatory Visit | Attending: Surgery | Admitting: Surgery

## 2014-02-18 ENCOUNTER — Encounter (HOSPITAL_BASED_OUTPATIENT_CLINIC_OR_DEPARTMENT_OTHER)
Admission: RE | Disposition: A | Discharge status: Home / Self Care | Payer: Self-pay | Source: Ambulatory Visit | Attending: Surgery

## 2014-02-18 ENCOUNTER — Encounter (HOSPITAL_BASED_OUTPATIENT_CLINIC_OR_DEPARTMENT_OTHER): Payer: Self-pay | Admitting: *Deleted

## 2014-02-18 ENCOUNTER — Ambulatory Visit (HOSPITAL_COMMUNITY): Payer: Medicare Other

## 2014-02-18 ENCOUNTER — Ambulatory Visit (HOSPITAL_BASED_OUTPATIENT_CLINIC_OR_DEPARTMENT_OTHER): Payer: Medicare Other | Admitting: Certified Registered"

## 2014-02-18 DIAGNOSIS — C189 Malignant neoplasm of colon, unspecified: Secondary | ICD-10-CM | POA: Insufficient documentation

## 2014-02-18 DIAGNOSIS — I10 Essential (primary) hypertension: Secondary | ICD-10-CM | POA: Insufficient documentation

## 2014-02-18 DIAGNOSIS — F329 Major depressive disorder, single episode, unspecified: Secondary | ICD-10-CM | POA: Diagnosis not present

## 2014-02-18 DIAGNOSIS — Z87891 Personal history of nicotine dependence: Secondary | ICD-10-CM | POA: Diagnosis not present

## 2014-02-18 DIAGNOSIS — F419 Anxiety disorder, unspecified: Secondary | ICD-10-CM | POA: Insufficient documentation

## 2014-02-18 DIAGNOSIS — Z95828 Presence of other vascular implants and grafts: Secondary | ICD-10-CM

## 2014-02-18 DIAGNOSIS — M1990 Unspecified osteoarthritis, unspecified site: Secondary | ICD-10-CM | POA: Diagnosis not present

## 2014-02-18 DIAGNOSIS — K219 Gastro-esophageal reflux disease without esophagitis: Secondary | ICD-10-CM | POA: Diagnosis not present

## 2014-02-18 HISTORY — DX: Gastro-esophageal reflux disease without esophagitis: K21.9

## 2014-02-18 HISTORY — PX: PORTACATH PLACEMENT: SHX2246

## 2014-02-18 LAB — POCT HEMOGLOBIN-HEMACUE: Hemoglobin: 11.5 g/dL — ABNORMAL LOW (ref 12.0–15.0)

## 2014-02-18 SURGERY — INSERTION, TUNNELED CENTRAL VENOUS DEVICE, WITH PORT
Anesthesia: General

## 2014-02-18 MED ORDER — LIDOCAINE HCL (PF) 1 % IJ SOLN
INTRAMUSCULAR | Status: AC
  Filled 2014-02-18: qty 30

## 2014-02-18 MED ORDER — EPHEDRINE SULFATE 50 MG/ML IJ SOLN
INTRAMUSCULAR | Status: DC | PRN
  Administered 2014-02-18: 5 mg via INTRAVENOUS
  Administered 2014-02-18: 10 mg via INTRAVENOUS

## 2014-02-18 MED ORDER — ONDANSETRON HCL 4 MG/2ML IJ SOLN: 4.0000 mg | Freq: Once | INTRAMUSCULAR | Status: DC | PRN

## 2014-02-18 MED ORDER — CEFAZOLIN SODIUM-DEXTROSE 2-3 GM-% IV SOLR
2.0000 g | INTRAVENOUS | Status: AC
Start: 2014-02-19 — End: 2014-02-18
  Administered 2014-02-18: 2 g via INTRAVENOUS

## 2014-02-18 MED ORDER — OXYCODONE HCL 5 MG PO TABS: 5.0000 mg | ORAL_TABLET | Freq: Once | ORAL | Status: DC | PRN

## 2014-02-18 MED ORDER — HEPARIN (PORCINE) IN NACL 2-0.9 UNIT/ML-% IJ SOLN
INTRAMUSCULAR | Status: DC | PRN
Start: 2014-02-18 — End: 2014-02-18
  Administered 2014-02-18: 500 mL

## 2014-02-18 MED ORDER — HEPARIN SOD (PORK) LOCK FLUSH 100 UNIT/ML IV SOLN
INTRAVENOUS | Status: DC | PRN
  Administered 2014-02-18: 500 [IU] via INTRAVENOUS

## 2014-02-18 MED ORDER — HEPARIN SOD (PORK) LOCK FLUSH 100 UNIT/ML IV SOLN
INTRAVENOUS | Status: AC
  Filled 2014-02-18: qty 5

## 2014-02-18 MED ORDER — PROPOFOL 10 MG/ML IV EMUL
INTRAVENOUS | Status: AC
Start: 2014-02-18 — End: 2014-02-18
  Filled 2014-02-18: qty 50

## 2014-02-18 MED ORDER — OXYCODONE HCL 5 MG PO TABS: 5.0000 mg | ORAL_TABLET | Freq: Four times a day (QID) | ORAL | Status: DC | PRN

## 2014-02-18 MED ORDER — MIDAZOLAM HCL 2 MG/2ML IJ SOLN
INTRAMUSCULAR | Status: AC
  Filled 2014-02-18: qty 2

## 2014-02-18 MED ORDER — MIDAZOLAM HCL 5 MG/5ML IJ SOLN
INTRAMUSCULAR | Status: DC | PRN
  Administered 2014-02-18: 2 mg via INTRAVENOUS

## 2014-02-18 MED ORDER — FENTANYL CITRATE 0.05 MG/ML IJ SOLN: 50.0000 ug | INTRAMUSCULAR | Status: DC | PRN

## 2014-02-18 MED ORDER — LACTATED RINGERS IV SOLN
INTRAVENOUS | Status: DC
  Administered 2014-02-18 (×2): via INTRAVENOUS

## 2014-02-18 MED ORDER — OXYCODONE HCL 5 MG/5ML PO SOLN: 5.0000 mg | Freq: Once | ORAL | Status: DC | PRN

## 2014-02-18 MED ORDER — FENTANYL CITRATE 0.05 MG/ML IJ SOLN
INTRAMUSCULAR | Status: AC
  Filled 2014-02-18: qty 6

## 2014-02-18 MED ORDER — DEXAMETHASONE SODIUM PHOSPHATE 4 MG/ML IJ SOLN
INTRAMUSCULAR | Status: DC | PRN
  Administered 2014-02-18: 10 mg via INTRAVENOUS

## 2014-02-18 MED ORDER — FENTANYL CITRATE 0.05 MG/ML IJ SOLN
INTRAMUSCULAR | Status: DC | PRN
  Administered 2014-02-18: 50 ug via INTRAVENOUS
  Administered 2014-02-18: 100 ug via INTRAVENOUS

## 2014-02-18 MED ORDER — BUPIVACAINE HCL (PF) 0.25 % IJ SOLN
INTRAMUSCULAR | Status: DC | PRN
  Administered 2014-02-18: 9 mL

## 2014-02-18 MED ORDER — BUPIVACAINE HCL (PF) 0.25 % IJ SOLN
INTRAMUSCULAR | Status: AC
  Filled 2014-02-18: qty 30

## 2014-02-18 MED ORDER — PROPOFOL 10 MG/ML IV BOLUS
INTRAVENOUS | Status: DC | PRN
  Administered 2014-02-18: 200 mg via INTRAVENOUS

## 2014-02-18 MED ORDER — HYDROMORPHONE HCL 1 MG/ML IJ SOLN: 0.2500 mg | INTRAMUSCULAR | Status: DC | PRN

## 2014-02-18 MED ORDER — CEFAZOLIN SODIUM-DEXTROSE 2-3 GM-% IV SOLR
INTRAVENOUS | Status: AC
  Filled 2014-02-18: qty 50

## 2014-02-18 MED ORDER — LIDOCAINE HCL (CARDIAC) 20 MG/ML IV SOLN
INTRAVENOUS | Status: DC | PRN
  Administered 2014-02-18: 100 mg via INTRAVENOUS

## 2014-02-18 MED ORDER — MIDAZOLAM HCL 2 MG/2ML IJ SOLN: 1.0000 mg | INTRAMUSCULAR | Status: DC | PRN

## 2014-02-18 SURGICAL SUPPLY — 52 items
APL SKNCLS STERI-STRIP NONHPOA (GAUZE/BANDAGES/DRESSINGS) ×1
BAG DECANTER FOR FLEXI CONT (MISCELLANEOUS) ×3 IMPLANT
BENZOIN TINCTURE PRP APPL 2/3 (GAUZE/BANDAGES/DRESSINGS) ×3 IMPLANT
BLADE SURG 15 STRL LF DISP TIS (BLADE) ×1 IMPLANT
BLADE SURG 15 STRL SS (BLADE) ×3
CANISTER SUCT 1200ML W/VALVE (MISCELLANEOUS) IMPLANT
CHLORAPREP W/TINT 26ML (MISCELLANEOUS) ×3 IMPLANT
CLOSURE WOUND 1/2 X4 (GAUZE/BANDAGES/DRESSINGS) ×1
COVER BACK TABLE 60X90IN (DRAPES) ×3 IMPLANT
COVER MAYO STAND STRL (DRAPES) ×3 IMPLANT
DECANTER SPIKE VIAL GLASS SM (MISCELLANEOUS) IMPLANT
DRAPE C-ARM 42X72 X-RAY (DRAPES) ×3 IMPLANT
DRAPE LAPAROTOMY T 102X78X121 (DRAPES) ×3 IMPLANT
DRAPE UTILITY XL STRL (DRAPES) ×3 IMPLANT
DRSG TEGADERM 4X4.75 (GAUZE/BANDAGES/DRESSINGS) ×3 IMPLANT
ELECT REM PT RETURN 9FT ADLT (ELECTROSURGICAL) ×3
ELECTRODE REM PT RTRN 9FT ADLT (ELECTROSURGICAL) ×1 IMPLANT
GLOVE EXAM NITRILE MD LF STRL (GLOVE) ×2 IMPLANT
GLOVE SURG ORTHO 8.0 STRL STRW (GLOVE) ×5 IMPLANT
GLOVE SURG SS PI 7.0 STRL IVOR (GLOVE) ×2 IMPLANT
GOWN STRL REUS W/ TWL LRG LVL3 (GOWN DISPOSABLE) ×1 IMPLANT
GOWN STRL REUS W/ TWL XL LVL3 (GOWN DISPOSABLE) ×1 IMPLANT
GOWN STRL REUS W/TWL LRG LVL3 (GOWN DISPOSABLE) ×3
GOWN STRL REUS W/TWL XL LVL3 (GOWN DISPOSABLE) ×6
IV KIT MINILOC 20X1 SAFETY (NEEDLE) IMPLANT
KIT BARDPORT ISP (Port) IMPLANT
KIT PORT POWER 8FR ISP CVUE (Catheter) ×2 IMPLANT
NDL BLUNT 17GA (NEEDLE) IMPLANT
NDL HYPO 25X1 1.5 SAFETY (NEEDLE) ×1 IMPLANT
NDL SAFETY ECLIPSE 18X1.5 (NEEDLE) IMPLANT
NEEDLE BLUNT 17GA (NEEDLE) IMPLANT
NEEDLE HYPO 18GX1.5 SHARP (NEEDLE)
NEEDLE HYPO 25X1 1.5 SAFETY (NEEDLE) ×3 IMPLANT
PACK BASIN DAY SURGERY FS (CUSTOM PROCEDURE TRAY) ×3 IMPLANT
PENCIL BUTTON HOLSTER BLD 10FT (ELECTRODE) ×3 IMPLANT
SET SHEATH INTRODUCER 10FR (MISCELLANEOUS) IMPLANT
SHEATH COOK PEEL AWAY SET 9F (SHEATH) IMPLANT
SLEEVE SCD COMPRESS KNEE MED (MISCELLANEOUS) ×2 IMPLANT
SPONGE GAUZE 4X4 12PLY STER LF (GAUZE/BANDAGES/DRESSINGS) ×3 IMPLANT
STRIP CLOSURE SKIN 1/2X4 (GAUZE/BANDAGES/DRESSINGS) ×2 IMPLANT
SUT PROLENE 2 0 CT2 30 (SUTURE) ×3 IMPLANT
SUT VIC AB 3-0 SH 27 (SUTURE) ×3
SUT VIC AB 3-0 SH 27X BRD (SUTURE) ×1 IMPLANT
SUT VICRYL 4-0 PS2 18IN ABS (SUTURE) ×3 IMPLANT
SYR 5ML LUER SLIP (SYRINGE) ×3 IMPLANT
SYR CONTROL 10ML LL (SYRINGE) ×3 IMPLANT
TAPE HYPAFIX 4 X10 (GAUZE/BANDAGES/DRESSINGS) IMPLANT
TOWEL OR 17X24 6PK STRL BLUE (TOWEL DISPOSABLE) ×6 IMPLANT
TOWEL OR NON WOVEN STRL DISP B (DISPOSABLE) ×3 IMPLANT
TUBE CONNECTING 20'X1/4 (TUBING)
TUBE CONNECTING 20X1/4 (TUBING) IMPLANT
YANKAUER SUCT BULB TIP NO VENT (SUCTIONS) IMPLANT

## 2014-02-18 NOTE — Anesthesia Postprocedure Evaluation (Signed)
  Anesthesia Post-op Note  Patient: Gail Carlson  Procedure(s) Performed: Procedure(s): INSERTION PORT-A-CATH LEFT SUBCLAVIAN VIEN (N/A)  Patient Location: PACU  Anesthesia Type: General   Level of Consciousness: awake, alert  and oriented  Airway and Oxygen Therapy: Patient Spontanous Breathing  Post-op Pain: none  Post-op Assessment: Post-op Vital signs reviewed  Post-op Vital Signs: Reviewed  Last Vitals:  Filed Vitals:   02/18/14 1600  BP: 154/62  Pulse: 70  Temp:   Resp: 11    Complications: No apparent anesthesia complications

## 2014-02-18 NOTE — Transfer of Care (Signed)
Immediate Anesthesia Transfer of Care Note  Patient: [name]  Procedure(s) Performed: Procedure(s): INSERTION PORT-A-CATH LEFT SUBCLAVIAN VIEN (N/A)  Patient Location: PACU  Anesthesia Type:General  Level of Consciousness: awake, alert  and patient cooperative  Airway & Oxygen Therapy: Patient Spontanous Breathing and Patient connected to face mask oxygen  Post-op Assessment: Report given to PACU RN, Post -op Vital signs reviewed and stable and Patient moving all extremities  Post vital signs: Reviewed and stable  Complications: No apparent anesthesia complications

## 2014-02-18 NOTE — Anesthesia Preprocedure Evaluation (Signed)
Anesthesia Evaluation  Patient identified by MRN, date of birth, ID band Patient awake    Reviewed: Allergy & Precautions, NPO status , Patient's Chart, lab work & pertinent test results  Airway Mallampati: I  TM Distance: >3 FB Neck ROM: Full    Dental  (+) Teeth Intact, Dental Advisory Given   Pulmonary Current Smoker,  breath sounds clear to auscultation        Cardiovascular hypertension, Pt. on medications Rhythm:Regular Rate:Normal     Neuro/Psych    GI/Hepatic GERD-  Medicated and Controlled,  Endo/Other    Renal/GU      Musculoskeletal   Abdominal   Peds  Hematology   Anesthesia Other Findings   Reproductive/Obstetrics                             Anesthesia Physical Anesthesia Plan  ASA: II  Anesthesia Plan: General   Post-op Pain Management:    Induction: Intravenous  Airway Management Planned: LMA  Additional Equipment:   Intra-op Plan:   Post-operative Plan: Extubation in OR  Informed Consent: I have reviewed the patients History and Physical, chart, labs and discussed the procedure including the risks, benefits and alternatives for the proposed anesthesia with the patient or authorized representative who has indicated his/her understanding and acceptance.   Dental advisory given  Plan Discussed with: CRNA, Anesthesiologist and Surgeon  Anesthesia Plan Comments:         Anesthesia Quick Evaluation

## 2014-02-18 NOTE — Brief Op Note (Signed)
02/18/2014  3:25 PM  PATIENT:  Gail Carlson  64 y.o. female  PRE-OPERATIVE DIAGNOSIS:  colon cancer  POST-OPERATIVE DIAGNOSIS:  colon cancer  PROCEDURE:  Procedure(s): INSERTION PORT-A-CATH LEFT SUBCLAVIAN VIEN (N/A)  SURGEON:  Surgeon(s) and Role:    * Armandina Gemma, MD - Primary  ANESTHESIA:   general  EBL:  Total I/O In: 1000 [I.V.:1000] Out: -   BLOOD ADMINISTERED:none  DRAINS: none   LOCAL MEDICATIONS USED:  MARCAINE     SPECIMEN:  No Specimen  DISPOSITION OF SPECIMEN:  N/A  COUNTS:  YES  TOURNIQUET:  * No tourniquets in log *  DICTATION: .Other Dictation: Dictation Number [id number]  PLAN OF CARE: Discharge to home after PACU  PATIENT DISPOSITION:  PACU - hemodynamically stable.   Delay start of Pharmacological VTE agent (>24hrs) due to surgical blood loss or risk of bleeding: yes  Earnstine Regal, MD, [facility] Office: [phone]

## 2014-02-18 NOTE — Interval H&P Note (Signed)
History and Physical Interval Note:  02/18/2014 2:19 PM  Gail Carlson  has presented today for surgery, with the diagnosis of colon cancer.  The various methods of treatment have been discussed with the patient and family. After consideration of risks, benefits and other options for treatment, the patient has consented to    Procedure(s): INSERTION PORT-A-CATH (N/A) as a surgical intervention .    The patient's history has been reviewed, patient examined, no change in status, stable for surgery.  I have reviewed the patient's chart and labs.  Questions were answered to the patient's satisfaction.    Earnstine Regal, MD, [facility] Office: [place]

## 2014-02-18 NOTE — Discharge Instructions (Signed)
°  Post Anesthesia Home Care Instructions  Activity: Get plenty of rest for the remainder of the day. A responsible adult should stay with you for 24 hours following the procedure.  For the next 24 hours, DO NOT: -Drive a car -Paediatric nurse -Drink alcoholic beverages -Take any medication unless instructed by your physician -Make any legal decisions or sign important papers.  Meals: Start with liquid foods such as gelatin or soup. Progress to regular foods as tolerated. Avoid greasy, spicy, heavy foods. If nausea and/or vomiting occur, drink only clear liquids until the nausea and/or vomiting subsides. Call your physician if vomiting continues.  Special Instructions/Symptoms: Your throat may feel dry or sore from the anesthesia or the breathing tube placed in your throat during surgery. If this causes discomfort, gargle with warm salt water. The discomfort should disappear within 24 hours.   Call your surgeon if you experience:   1.  Fever over 101.0. 2.  Inability to urinate. 3.  Nausea and/or vomiting. 4.  Extreme swelling or bruising at the surgical site. 5.  Continued bleeding from the incision. 6.  Increased pain, redness or drainage from the incision. 7.  Problems related to your pain medication. 8. Any change in color, movement and/or sensation 9. Any problems and/or concerns

## 2014-02-18 NOTE — Anesthesia Procedure Notes (Signed)
Procedure Name: LMA Insertion Date/Time: 02/18/2014 2:42 PM Performed by: Baxter Flattery Pre-anesthesia Checklist: Patient identified, Emergency Drugs available, Suction available and Patient being monitored Patient Re-evaluated:Patient Re-evaluated prior to inductionOxygen Delivery Method: Circle System Utilized Preoxygenation: Pre-oxygenation with 100% oxygen Intubation Type: IV induction Ventilation: Mask ventilation without difficulty LMA: LMA inserted LMA Size: 4.0 Number of attempts: 1 Airway Equipment and Method: Bite block Placement Confirmation: positive ETCO2 and breath sounds checked- equal and bilateral Tube secured with: Tape Dental Injury: Teeth and Oropharynx as per pre-operative assessment

## 2014-02-19 ENCOUNTER — Encounter (HOSPITAL_BASED_OUTPATIENT_CLINIC_OR_DEPARTMENT_OTHER): Payer: Self-pay | Admitting: Surgery

## 2014-02-19 ENCOUNTER — Other Ambulatory Visit: Payer: Self-pay | Admitting: *Deleted

## 2014-02-19 DIAGNOSIS — C189 Malignant neoplasm of colon, unspecified: Secondary | ICD-10-CM

## 2014-02-19 NOTE — Op Note (Signed)
NAMEMAEVE, Gail Carlson              ACCOUNT NO.:  [id number]  MEDICAL RECORD NO.:  [id number]  LOCATION:                                FACILITY:  [facility]  PHYSICIAN:  Earnstine Regal, MD      DATE OF BIRTH:  [birth year]  DATE OF PROCEDURE:  02/18/2014                              OPERATIVE REPORT   PREOPERATIVE DIAGNOSIS:  Adenocarcinoma of the colon.  POSTOPERATIVE DIAGNOSIS:  Adenocarcinoma of the colon.  PROCEDURE:  Placement of left subclavian vein infusion port.  SURGEON:  Earnstine Regal, MD, FACS  ANESTHESIA:  General.  ESTIMATED BLOOD LOSS:  Minimal.  PREPARATION:  ChloraPrep.  COMPLICATIONS:  None.  INDICATIONS:  The patient is a 64 year old female, who recently underwent resection of a right colon adenocarcinoma.  It is stage IIIB. She is scheduled for adjuvant chemotherapy.  She desires a chronic venous access device.  BODY OF REPORT:  Procedure was done in OR #2 at the [facility].  The patient was brought to the operating room, placed in a supine position on the operating room table.  Following administration of general anesthesia, the patient was positioned and then prepped and draped in the usual aseptic fashion.  After ascertaining that an adequate level of anesthesia had been achieved, the skin beneath the left clavicle was anesthetized with local anesthetic.  Using an 18-gauge Seldinger needle, the left subclavian vein was accessed in a single pass.  A soft J-shaped guidewire was inserted and the needle removed. Using C-arm fluoroscopy, the guidewire was advanced into the superior vena cava and then into the right atrium.  Next, the skin of the left chest wall was anesthetized with local anesthetic.  A 2 cm incision was made with a #15 blade.  Dissection was carried into subcutaneous tissues down to the chest wall.  A subcutaneous pocket was created with the electrocautery to accommodate the [employer].  Power Port was assembled and flushed  with heparinized saline.  Port was inserted into the subcutaneous pocket. Using a tendon Passer, the catheter was tunneled subcutaneously from the port site to the site of guidewire insertion.  Guidewire site was opened subcutaneously with a hemostat.  Catheter was measured to the appropriate length being 24 cm and divided.  Using a dilator and peel- away sleeve, the subclavian vein was accessed and the guidewire removed. Dilator was removed.  Catheter was then passed through the peel-away sleeve into position and the peel-away sleeve was removed.  Port aspirates blood easily and flushes easily with heparinized saline.  C- arm fluoroscopy confirmed catheter placement at the junction of the superior vena cava and right atrium.  Subcutaneous tissues closed with interrupted 3-0 Vicryl sutures.  Skin was closed with running 4-0 Vicryl subcuticular suture.  Wounds were washed and dried and Dermabond was applied as dressing.  Port was accessed with a Huber needle and 5 mL of heparin lock flush, 100 unit/mL heparin, is placed through the port.  The patient was awakened from anesthesia and brought to the recovery room.  Portable chest x-ray will be obtained in the recovery room prior to discharge home.  The patient tolerated the procedure well.  Earnstine Regal, MD, [facility] Office: [phone]    TMG/MEDQ  D:  02/18/2014  T:  02/18/2014  Job:  [id number]  cc:   Ladell Pier, M.D.

## 2014-02-20 ENCOUNTER — Ambulatory Visit (HOSPITAL_BASED_OUTPATIENT_CLINIC_OR_DEPARTMENT_OTHER): Payer: Medicare Other

## 2014-02-20 ENCOUNTER — Other Ambulatory Visit (HOSPITAL_BASED_OUTPATIENT_CLINIC_OR_DEPARTMENT_OTHER): Payer: Medicare Other

## 2014-02-20 DIAGNOSIS — C189 Malignant neoplasm of colon, unspecified: Secondary | ICD-10-CM

## 2014-02-20 DIAGNOSIS — C182 Malignant neoplasm of ascending colon: Secondary | ICD-10-CM

## 2014-02-20 DIAGNOSIS — Z5111 Encounter for antineoplastic chemotherapy: Secondary | ICD-10-CM

## 2014-02-20 LAB — COMPREHENSIVE METABOLIC PANEL (CC13)
ALBUMIN: 3.6 g/dL (ref 3.5–5.0)
ALK PHOS: 81 U/L (ref 40–150)
ALT: <6 U/L (ref 0–55)
AST: 11 U/L (ref 5–34)
Anion Gap: 7 mEq/L (ref 3–11)
BILIRUBIN TOTAL: 0.2 mg/dL (ref 0.20–1.20)
BUN: 16 mg/dL (ref 7.0–26.0)
CALCIUM: 8.4 mg/dL (ref 8.4–10.4)
CO2: 29 mEq/L (ref 22–29)
Chloride: 108 mEq/L (ref 98–109)
Creatinine: 0.9 mg/dL (ref 0.6–1.1)
EGFR: 75 mL/min/{1.73_m2} — ABNORMAL LOW (ref >90)
Glucose: 85 mg/dl (ref 70–140)
Potassium: 3.6 mEq/L (ref 3.5–5.1)
SODIUM: 143 meq/L (ref 136–145)
TOTAL PROTEIN: 6.5 g/dL (ref 6.4–8.3)

## 2014-02-20 LAB — CBC WITH DIFFERENTIAL/PLATELET
BASO%: 0.8 % (ref 0.0–2.0)
Basophils Absolute: 0.1 10*3/uL (ref 0.0–0.1)
EOS%: 1.5 % (ref 0.0–7.0)
Eosinophils Absolute: 0.1 10*3/uL (ref 0.0–0.5)
HEMATOCRIT: 33.8 % — AB (ref 34.8–46.6)
HGB: 9.9 g/dL — ABNORMAL LOW (ref 11.6–15.9)
LYMPH#: 1.5 10*3/uL (ref 0.9–3.3)
LYMPH%: 21.1 % (ref 14.0–49.7)
MCH: 21.9 pg — ABNORMAL LOW (ref 25.1–34.0)
MCHC: 29.3 g/dL — AB (ref 31.5–36.0)
MCV: 74.8 fL — AB (ref 79.5–101.0)
MONO#: 0.5 10*3/uL (ref 0.1–0.9)
MONO%: 6.5 % (ref 0.0–14.0)
NEUT%: 70.1 % (ref 38.4–76.8)
NEUTROS ABS: 5.1 10*3/uL (ref 1.5–6.5)
Platelets: 252 10*3/uL (ref 145–400)
RBC: 4.51 10*6/uL (ref 3.70–5.45)
RDW: 26.3 % — AB (ref 11.2–14.5)
WBC: 7.2 10*3/uL (ref 3.9–10.3)

## 2014-02-20 MED ORDER — FLUOROURACIL CHEMO INJECTION 2.5 GM/50ML
400.0000 mg/m2 | Freq: Once | INTRAVENOUS | Status: AC
  Administered 2014-02-20: 750 mg via INTRAVENOUS
  Filled 2014-02-20: qty 15

## 2014-02-20 MED ORDER — DEXAMETHASONE SODIUM PHOSPHATE 10 MG/ML IJ SOLN
10.0000 mg | Freq: Once | INTRAMUSCULAR | Status: AC
  Administered 2014-02-20: 10 mg via INTRAVENOUS

## 2014-02-20 MED ORDER — DEXAMETHASONE SODIUM PHOSPHATE 10 MG/ML IJ SOLN
INTRAMUSCULAR | Status: AC
  Filled 2014-02-20: qty 1

## 2014-02-20 MED ORDER — LEUCOVORIN CALCIUM INJECTION 350 MG
400.0000 mg/m2 | Freq: Once | INTRAVENOUS | Status: AC
  Administered 2014-02-20: 744 mg via INTRAVENOUS
  Filled 2014-02-20: qty 37.2

## 2014-02-20 MED ORDER — FLUOROURACIL CHEMO INJECTION 5 GM/100ML
2400.0000 mg/m2 | INTRAVENOUS | Status: DC
  Administered 2014-02-20: 4450 mg via INTRAVENOUS
  Filled 2014-02-20: qty 89

## 2014-02-20 MED ORDER — DEXTROSE 5 % IV SOLN
Freq: Once | INTRAVENOUS | Status: AC
  Administered 2014-02-20: 12:00:00 via INTRAVENOUS

## 2014-02-20 MED ORDER — ONDANSETRON 8 MG/NS 50 ML IVPB
INTRAVENOUS | Status: AC
  Filled 2014-02-20: qty 8

## 2014-02-20 MED ORDER — ONDANSETRON 8 MG/50ML IVPB (CHCC)
8.0000 mg | Freq: Once | INTRAVENOUS | Status: AC
  Administered 2014-02-20: 8 mg via INTRAVENOUS

## 2014-02-20 MED ORDER — OXALIPLATIN CHEMO INJECTION 100 MG/20ML
85.0000 mg/m2 | Freq: Once | INTRAVENOUS | Status: AC
  Administered 2014-02-20: 160 mg via INTRAVENOUS
  Filled 2014-02-20: qty 32

## 2014-02-20 NOTE — Progress Notes (Signed)
1440 Education done with pt and daughter re: spill kit, # on pump to call if problems; battery compartment on pump.  All questions from daughter and pt were answered and both verbalized understanding of instruction concerning problem solving beeps from pump; monitoring temp at home; drinking room temp drinks and avoiding cold and # to [facility] if further questions.

## 2014-02-20 NOTE — Patient Instructions (Signed)
[facility] Discharge Instructions for Patients Receiving Chemotherapy  Today you received the following chemotherapy agents Oxaliplatin, Leucovorin, and 5FU  To help prevent nausea and vomiting after your treatment, we encourage you to take your nausea medication Compazine 10 mg every 6 hours or Zofran 4 mg every 8 hours as needed for nausea   If you develop nausea and vomiting that is not controlled by your nausea medication, call the clinic.   BELOW ARE SYMPTOMS THAT SHOULD BE REPORTED IMMEDIATELY:  *FEVER GREATER THAN 100.5 F  *CHILLS WITH OR WITHOUT FEVER  NAUSEA AND VOMITING THAT IS NOT CONTROLLED WITH YOUR NAUSEA MEDICATION  *UNUSUAL SHORTNESS OF BREATH  *UNUSUAL BRUISING OR BLEEDING  TENDERNESS IN MOUTH AND THROAT WITH OR WITHOUT PRESENCE OF ULCERS  *URINARY PROBLEMS  *BOWEL PROBLEMS  UNUSUAL RASH Items with * indicate a potential emergency and should be followed up as soon as possible.  Feel free to call the clinic you have any questions or concerns. The clinic phone number is [phone].

## 2014-02-22 ENCOUNTER — Ambulatory Visit (HOSPITAL_BASED_OUTPATIENT_CLINIC_OR_DEPARTMENT_OTHER): Payer: Medicare Other

## 2014-02-22 DIAGNOSIS — C182 Malignant neoplasm of ascending colon: Secondary | ICD-10-CM

## 2014-02-22 DIAGNOSIS — C189 Malignant neoplasm of colon, unspecified: Secondary | ICD-10-CM

## 2014-02-22 DIAGNOSIS — Z452 Encounter for adjustment and management of vascular access device: Secondary | ICD-10-CM

## 2014-02-22 MED ORDER — HEPARIN SOD (PORK) LOCK FLUSH 100 UNIT/ML IV SOLN
500.0000 [IU] | Freq: Once | INTRAVENOUS | Status: AC | PRN
  Administered 2014-02-22: 500 [IU]
  Filled 2014-02-22: qty 5

## 2014-02-22 MED ORDER — SODIUM CHLORIDE 0.9 % IJ SOLN
10.0000 mL | INTRAMUSCULAR | Status: DC | PRN
  Administered 2014-02-22: 10 mL
  Filled 2014-02-22: qty 10

## 2014-02-27 ENCOUNTER — Encounter: Payer: Medicare Other | Admitting: Gastroenterology

## 2014-03-02 ENCOUNTER — Other Ambulatory Visit: Payer: Self-pay | Admitting: Oncology

## 2014-03-05 ENCOUNTER — Other Ambulatory Visit: Payer: Self-pay | Admitting: *Deleted

## 2014-03-05 DIAGNOSIS — C189 Malignant neoplasm of colon, unspecified: Secondary | ICD-10-CM

## 2014-03-06 ENCOUNTER — Ambulatory Visit (HOSPITAL_BASED_OUTPATIENT_CLINIC_OR_DEPARTMENT_OTHER): Payer: Medicare Other

## 2014-03-06 ENCOUNTER — Ambulatory Visit (HOSPITAL_BASED_OUTPATIENT_CLINIC_OR_DEPARTMENT_OTHER): Payer: Medicare Other | Admitting: Nurse Practitioner

## 2014-03-06 ENCOUNTER — Other Ambulatory Visit (HOSPITAL_BASED_OUTPATIENT_CLINIC_OR_DEPARTMENT_OTHER): Payer: Medicare Other

## 2014-03-06 VITALS — BP 159/83 | HR 85 | Temp 98.5°F | Resp 18 | Ht 62.0 in | Wt 172.1 lb

## 2014-03-06 DIAGNOSIS — C189 Malignant neoplasm of colon, unspecified: Secondary | ICD-10-CM

## 2014-03-06 DIAGNOSIS — Z5111 Encounter for antineoplastic chemotherapy: Secondary | ICD-10-CM

## 2014-03-06 DIAGNOSIS — C182 Malignant neoplasm of ascending colon: Secondary | ICD-10-CM

## 2014-03-06 DIAGNOSIS — Z803 Family history of malignant neoplasm of breast: Secondary | ICD-10-CM

## 2014-03-06 DIAGNOSIS — D509 Iron deficiency anemia, unspecified: Secondary | ICD-10-CM

## 2014-03-06 DIAGNOSIS — R110 Nausea: Secondary | ICD-10-CM

## 2014-03-06 LAB — COMPREHENSIVE METABOLIC PANEL (CC13)
ALBUMIN: 3.6 g/dL (ref 3.5–5.0)
ALT: 8 U/L (ref 0–55)
AST: 14 U/L (ref 5–34)
Alkaline Phosphatase: 94 U/L (ref 40–150)
Anion Gap: 6 mEq/L (ref 3–11)
BILIRUBIN TOTAL: 0.3 mg/dL (ref 0.20–1.20)
BUN: 13.5 mg/dL (ref 7.0–26.0)
CHLORIDE: 108 meq/L (ref 98–109)
CO2: 29 mEq/L (ref 22–29)
Calcium: 8.9 mg/dL (ref 8.4–10.4)
Creatinine: 1.2 mg/dL — ABNORMAL HIGH (ref 0.6–1.1)
EGFR: 58 mL/min/{1.73_m2} — ABNORMAL LOW (ref >90)
Glucose: 99 mg/dl (ref 70–140)
Potassium: 3.3 mEq/L — ABNORMAL LOW (ref 3.5–5.1)
SODIUM: 144 meq/L (ref 136–145)
TOTAL PROTEIN: 6.8 g/dL (ref 6.4–8.3)

## 2014-03-06 LAB — CBC WITH DIFFERENTIAL/PLATELET
BASO%: 1 % (ref 0.0–2.0)
BASOS ABS: 0 10*3/uL (ref 0.0–0.1)
EOS%: 2.4 % (ref 0.0–7.0)
Eosinophils Absolute: 0.1 10*3/uL (ref 0.0–0.5)
HCT: 36.4 % (ref 34.8–46.6)
HEMOGLOBIN: 10.8 g/dL — AB (ref 11.6–15.9)
LYMPH%: 23 % (ref 14.0–49.7)
MCH: 22.8 pg — ABNORMAL LOW (ref 25.1–34.0)
MCHC: 29.6 g/dL — AB (ref 31.5–36.0)
MCV: 77 fL — AB (ref 79.5–101.0)
MONO#: 0.4 10*3/uL (ref 0.1–0.9)
MONO%: 9.1 % (ref 0.0–14.0)
NEUT#: 2.6 10*3/uL (ref 1.5–6.5)
NEUT%: 64.5 % (ref 38.4–76.8)
PLATELETS: 256 10*3/uL (ref 145–400)
RBC: 4.73 10*6/uL (ref 3.70–5.45)
RDW: 25.8 % — AB (ref 11.2–14.5)
WBC: 4.1 10*3/uL (ref 3.9–10.3)
lymph#: 0.9 10*3/uL (ref 0.9–3.3)

## 2014-03-06 MED ORDER — SODIUM CHLORIDE 0.9 % IV SOLN
150.0000 mg | Freq: Once | INTRAVENOUS | Status: AC
  Administered 2014-03-06: 150 mg via INTRAVENOUS
  Filled 2014-03-06: qty 5

## 2014-03-06 MED ORDER — DEXAMETHASONE SODIUM PHOSPHATE 10 MG/ML IJ SOLN
INTRAMUSCULAR | Status: AC
Start: 2014-03-06 — End: 2014-03-06
  Filled 2014-03-06: qty 1

## 2014-03-06 MED ORDER — DEXAMETHASONE SODIUM PHOSPHATE 10 MG/ML IJ SOLN
10.0000 mg | Freq: Once | INTRAMUSCULAR | Status: AC
  Administered 2014-03-06: 10 mg via INTRAVENOUS

## 2014-03-06 MED ORDER — OXALIPLATIN CHEMO INJECTION 100 MG/20ML
85.0000 mg/m2 | Freq: Once | INTRAVENOUS | Status: AC
  Administered 2014-03-06: 160 mg via INTRAVENOUS
  Filled 2014-03-06: qty 32

## 2014-03-06 MED ORDER — SODIUM CHLORIDE 0.9 % IV SOLN
2400.0000 mg/m2 | INTRAVENOUS | Status: DC
  Administered 2014-03-06: 4450 mg via INTRAVENOUS
  Filled 2014-03-06: qty 89

## 2014-03-06 MED ORDER — DEXTROSE 5 % IV SOLN
400.0000 mg/m2 | Freq: Once | INTRAVENOUS | Status: AC
  Administered 2014-03-06: 744 mg via INTRAVENOUS
  Filled 2014-03-06: qty 37.2

## 2014-03-06 MED ORDER — PALONOSETRON HCL INJECTION 0.25 MG/5ML
INTRAVENOUS | Status: AC
  Filled 2014-03-06: qty 5

## 2014-03-06 MED ORDER — DEXTROSE 5 % IV SOLN
Freq: Once | INTRAVENOUS | Status: AC
  Administered 2014-03-06: 13:00:00 via INTRAVENOUS

## 2014-03-06 MED ORDER — PALONOSETRON HCL INJECTION 0.25 MG/5ML
0.2500 mg | Freq: Once | INTRAVENOUS | Status: AC
  Administered 2014-03-06: 0.25 mg via INTRAVENOUS

## 2014-03-06 MED ORDER — FLUOROURACIL CHEMO INJECTION 2.5 GM/50ML
400.0000 mg/m2 | Freq: Once | INTRAVENOUS | Status: AC
  Administered 2014-03-06: 750 mg via INTRAVENOUS
  Filled 2014-03-06: qty 15

## 2014-03-06 NOTE — Progress Notes (Addendum)
  [place] OFFICE PROGRESS NOTE   Diagnosis:  Colon cancer  INTERVAL HISTORY:   Gail Carlson returns as scheduled. She completed cycle 1 FOLFOX 02/20/2014. She had nausea for about 5 days after the chemotherapy. No vomiting. No mouth sores. She was initially constipated, took milk of magnesia and developed loose stools. Bowel habits have since returned to baseline. She is avoiding exposure to cold. No numbness or tingling in her hands or feet.  She reports a one-week history of left arm pain.  Objective:  Vital signs in last 24 hours:  Blood pressure 159/83, pulse 85, temperature 98.5 F (36.9 C), temperature source Oral, resp. rate 18, height _0  (1.575 m), weight 172 lb 1.6 oz (78.064 kg), SpO2 99 %.    HEENT: No thrush or ulcers. Resp: Lungs clear bilaterally. Cardio: Regular rate and rhythm. GI: Abdomen soft and nontender. No hepatomegaly. Vascular: No leg edema. Left upper arm appears slightly larger than the right upper arm. Vein engorgement over the left lateral chest wall/upper arm. Slight fullness at the left supraclavicular region. Port-A-Cath is without erythema.    Lab Results:  Lab Results  Component Value Date   WBC 4.1 03/06/2014   HGB 10.8* 03/06/2014   HCT 36.4 03/06/2014   MCV 77.0* 03/06/2014   PLT 256 03/06/2014   NEUTROABS 2.6 03/06/2014    Imaging:  No results found.  Medications: I have reviewed the patient's current medications.  Assessment/Plan: 1. 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  CTs of the chest, abdomen, and pelvis on 01/10/2014 with a cecal mass, nonspecific small liver lesions, and nonspecific lung nodules felt to most likely represent mucoid impaction  Cycle 1 FOLFOX 02/20/2014  Cycle 2 FOLFOX 03/06/2014  2. Iron deficiency  anemia 3. Family history of breast cancer 4. Port-A-Cath placement 02/18/2014 5. Delayed nausea following cycle 1 FOLFOX. Aloxi and Emend added beginning with cycle 2.    Disposition: [name] appears stable. She has completed 1 cycle of FOLFOX. Plan to proceed with cycle 2 today as scheduled.   She had delayed nausea following cycle 1. She will receive Aloxi and Emend beginning with cycle 2.  We are referring her for a venous Doppler of the left arm to rule out a DVT.  She will return for a follow-up visit and cycle 3 FOLFOX in 2 weeks. She will contact the office in the interim with any problems.  Patient seen with Dr. Benay Spice.    Ned Card ANP/GNP-BC   03/06/2014  11:49 AM  This was a shared visit with Ned Card. She will complete cycle 2  folfox today. She will be referred for a doppler of the LUE.  [name], MD

## 2014-03-06 NOTE — Progress Notes (Signed)
Patient denies receipt of Flu vaccine and does not wish to receive.

## 2014-03-06 NOTE — Progress Notes (Signed)
Discharged, ambulatory in no distress with youngest daughter.

## 2014-03-06 NOTE — Patient Instructions (Signed)
[facility] Discharge Instructions for Patients Receiving Chemotherapy  Today you received the following chemotherapy agents: Oxaliplatin, leucovorin, Fluorouracil.  To help prevent nausea and vomiting after your treatment, we encourage you to take your nausea medication compazine 10 mg every six hours, zofran 4 mg every 8 hrs if needed as ordered by Dr. Benay Spice.   If you develop nausea and vomiting that is not controlled by your nausea medication, call the clinic.   BELOW ARE SYMPTOMS THAT SHOULD BE REPORTED IMMEDIATELY:  *FEVER GREATER THAN 100.5 F  *CHILLS WITH OR WITHOUT FEVER  NAUSEA AND VOMITING THAT IS NOT CONTROLLED WITH YOUR NAUSEA MEDICATION  *UNUSUAL SHORTNESS OF BREATH  *UNUSUAL BRUISING OR BLEEDING  TENDERNESS IN MOUTH AND THROAT WITH OR WITHOUT PRESENCE OF ULCERS  *URINARY PROBLEMS  *BOWEL PROBLEMS  UNUSUAL RASH Items with * indicate a potential emergency and should be followed up as soon as possible.  Feel free to call the clinic you have any questions or concerns. The clinic phone number is [phone].

## 2014-03-07 ENCOUNTER — Ambulatory Visit (HOSPITAL_COMMUNITY)
Admission: RE | Admit: 2014-03-07 | Discharge: 2014-03-07 | Disposition: A | Discharge status: Home / Self Care | Payer: Medicare Other | Source: Ambulatory Visit | Attending: Oncology | Admitting: Oncology

## 2014-03-07 ENCOUNTER — Telehealth: Payer: Self-pay | Admitting: *Deleted

## 2014-03-07 DIAGNOSIS — M7989 Other specified soft tissue disorders: Secondary | ICD-10-CM | POA: Insufficient documentation

## 2014-03-07 DIAGNOSIS — D509 Iron deficiency anemia, unspecified: Secondary | ICD-10-CM

## 2014-03-07 DIAGNOSIS — C189 Malignant neoplasm of colon, unspecified: Secondary | ICD-10-CM

## 2014-03-07 DIAGNOSIS — M79609 Pain in unspecified limb: Secondary | ICD-10-CM

## 2014-03-07 NOTE — Telephone Encounter (Signed)
Per staff message and POF I have scheduled appts. Advised scheduler of appts. JMW  

## 2014-03-07 NOTE — Telephone Encounter (Signed)
Patient walked in treatment room.  Saw this nurse who performed yesterday's treatment asking for a new dressing to port-a-cath.  Noted tegaderm film has been placed on bottom of dressing and internal aspect of dressing loosely adhering to site.  On yesterday patient asked for tape applied to dressing stating "I move a lot".  This nurse applied Hypofix tape to outer border leaving window uncovered.  Applied Tegaderm to outer border after skin prep applied.  Advised her not to touch or remove dressing.  States she perspires and attributes this to dressing not sticking well.

## 2014-03-07 NOTE — Progress Notes (Signed)
*  Preliminary Results* Left upper extremity venous duplex completed. Left upper extremity is negative for deep and superficial vein thrombosis.  Preliminary results discussed with Ned Card, NP.  03/07/2014 10:44 AM  Maudry Mayhew, RVT, RDCS, RDMS

## 2014-03-08 ENCOUNTER — Ambulatory Visit (HOSPITAL_BASED_OUTPATIENT_CLINIC_OR_DEPARTMENT_OTHER): Payer: Medicare Other

## 2014-03-08 DIAGNOSIS — C182 Malignant neoplasm of ascending colon: Secondary | ICD-10-CM

## 2014-03-08 DIAGNOSIS — Z452 Encounter for adjustment and management of vascular access device: Secondary | ICD-10-CM

## 2014-03-08 MED ORDER — HEPARIN SOD (PORK) LOCK FLUSH 100 UNIT/ML IV SOLN
500.0000 [IU] | Freq: Once | INTRAVENOUS | Status: AC | PRN
  Administered 2014-03-08: 500 [IU]
  Filled 2014-03-08: qty 5

## 2014-03-08 MED ORDER — SODIUM CHLORIDE 0.9 % IJ SOLN
10.0000 mL | INTRAMUSCULAR | Status: DC | PRN
  Administered 2014-03-08: 10 mL
  Filled 2014-03-08: qty 10

## 2014-03-12 ENCOUNTER — Other Ambulatory Visit: Payer: Self-pay | Admitting: *Deleted

## 2014-03-12 ENCOUNTER — Telehealth: Payer: Self-pay | Admitting: Oncology

## 2014-03-12 MED ORDER — PROCHLORPERAZINE MALEATE 10 MG PO TABS: 10.0000 mg | ORAL_TABLET | Freq: Four times a day (QID) | ORAL | Status: DC | PRN

## 2014-03-12 NOTE — Telephone Encounter (Signed)
Received fax for refill request.  Pt seen 2/4 has appt 2/18- medication refilled

## 2014-03-12 NOTE — Telephone Encounter (Signed)
Patient's daughter called to request refill on nausea medication.  Phone call made to patient to inform her that a refill request has been submitted for her Compazine by Jethro Bolus, LPN for Ned Card.

## 2014-03-13 ENCOUNTER — Telehealth: Payer: Self-pay

## 2014-03-13 ENCOUNTER — Other Ambulatory Visit (HOSPITAL_BASED_OUTPATIENT_CLINIC_OR_DEPARTMENT_OTHER): Payer: Medicare Other

## 2014-03-13 ENCOUNTER — Ambulatory Visit (HOSPITAL_BASED_OUTPATIENT_CLINIC_OR_DEPARTMENT_OTHER): Payer: Medicare Other | Admitting: Nurse Practitioner

## 2014-03-13 ENCOUNTER — Telehealth: Payer: Self-pay | Admitting: Nurse Practitioner

## 2014-03-13 ENCOUNTER — Encounter: Payer: Self-pay | Admitting: Nurse Practitioner

## 2014-03-13 ENCOUNTER — Telehealth: Payer: Self-pay | Admitting: *Deleted

## 2014-03-13 VITALS — BP 150/97 | HR 86 | Temp 98.7°F | Resp 18 | Ht 62.0 in | Wt 173.0 lb

## 2014-03-13 DIAGNOSIS — C189 Malignant neoplasm of colon, unspecified: Secondary | ICD-10-CM

## 2014-03-13 DIAGNOSIS — R112 Nausea with vomiting, unspecified: Secondary | ICD-10-CM

## 2014-03-13 DIAGNOSIS — C182 Malignant neoplasm of ascending colon: Secondary | ICD-10-CM

## 2014-03-13 HISTORY — DX: Nausea with vomiting, unspecified: R11.2

## 2014-03-13 LAB — CBC WITH DIFFERENTIAL/PLATELET
BASO%: 0.5 % (ref 0.0–2.0)
Basophils Absolute: 0 10*3/uL (ref 0.0–0.1)
EOS%: 1.5 % (ref 0.0–7.0)
Eosinophils Absolute: 0.1 10*3/uL (ref 0.0–0.5)
HEMATOCRIT: 37.7 % (ref 34.8–46.6)
HEMOGLOBIN: 11 g/dL — AB (ref 11.6–15.9)
LYMPH%: 25.8 % (ref 14.0–49.7)
MCH: 23.3 pg — AB (ref 25.1–34.0)
MCHC: 29.2 g/dL — ABNORMAL LOW (ref 31.5–36.0)
MCV: 79.9 fL (ref 79.5–101.0)
MONO#: 0.3 10*3/uL (ref 0.1–0.9)
MONO%: 6.4 % (ref 0.0–14.0)
NEUT#: 2.7 10*3/uL (ref 1.5–6.5)
NEUT%: 65.8 % (ref 38.4–76.8)
PLATELETS: 230 10*3/uL (ref 145–400)
RBC: 4.72 10*6/uL (ref 3.70–5.45)
RDW: 22.2 % — AB (ref 11.2–14.5)
WBC: 4.1 10*3/uL (ref 3.9–10.3)
lymph#: 1.1 10*3/uL (ref 0.9–3.3)

## 2014-03-13 LAB — COMPREHENSIVE METABOLIC PANEL (CC13)
ALBUMIN: 3.6 g/dL (ref 3.5–5.0)
ALT: 8 U/L (ref 0–55)
AST: 14 U/L (ref 5–34)
Alkaline Phosphatase: 93 U/L (ref 40–150)
Anion Gap: 7 mEq/L (ref 3–11)
BUN: 15 mg/dL (ref 7.0–26.0)
CO2: 28 mEq/L (ref 22–29)
Calcium: 9 mg/dL (ref 8.4–10.4)
Chloride: 110 mEq/L — ABNORMAL HIGH (ref 98–109)
Creatinine: 1.1 mg/dL (ref 0.6–1.1)
EGFR: 62 mL/min/{1.73_m2} — ABNORMAL LOW (ref >90)
Glucose: 123 mg/dl (ref 70–140)
Potassium: 3.7 mEq/L (ref 3.5–5.1)
SODIUM: 145 meq/L (ref 136–145)
Total Bilirubin: 0.24 mg/dL (ref 0.20–1.20)
Total Protein: 6.7 g/dL (ref 6.4–8.3)

## 2014-03-13 MED ORDER — ONDANSETRON 8 MG PO TBDP: 8.0000 mg | ORAL_TABLET | Freq: Four times a day (QID) | ORAL | Status: DC | PRN

## 2014-03-13 MED ORDER — FERROUS SULFATE 325 (65 FE) MG PO TABS: 325.0000 mg | ORAL_TABLET | Freq: Two times a day (BID) | ORAL | Status: DC

## 2014-03-13 MED ORDER — AMLODIPINE BESYLATE 10 MG PO TABS: 10.0000 mg | ORAL_TABLET | Freq: Every day | ORAL | Status: DC

## 2014-03-13 NOTE — Progress Notes (Signed)
Addendum: Also, refilled both Norvasc and iron tablets for patient per her request.

## 2014-03-13 NOTE — Telephone Encounter (Signed)
Been throwing up for 2 days, once this morning, 3 times yesterday. Bowels moving. No GU problems. No fever. No resp difficulty. Set up appt with Selena Lesser.

## 2014-03-13 NOTE — Telephone Encounter (Signed)
Received voicemail from pt, noted she is being worked in to symptom management clinic. Will make Dr. Benay Spice aware.

## 2014-03-13 NOTE — Progress Notes (Signed)
SYMPTOM MANAGEMENT CLINIC   HPI: Gail Carlson 64 y.o. female diagnosed with colon cancer.  Currently undergoing FOLFOX chemotherapy regimen.  Patient called the cancer Center today requesting urgent care visit.  Patient completed cycle 2 of her FOLFOX chemotherapy on 03/06/2014.  She once again developed some delayed nausea/vomiting following this last cycle of chemotherapy; despite the addition of both Aloxi and Emend with her last chemotherapy.  She states she has remained nauseous for the past several days; and has vomited twice.  She states taking Compazine has been fairly ineffective.  She continues able to both eat and drink fairly well; he denies feeling dehydrated.  She denies any recent fevers or chills.  Chills denies any diarrhea or constipation.   HPI  CURRENT THERAPY: Upcoming Treatment Dates - COLORECTAL FOLFOX q14d Days with orders from any treatment category:  03/20/2014      SCHEDULING COMMUNICATION      dexamethasone (DECADRON) injection 10 mg      palonosetron (ALOXI) injection 0.25 mg      fosaprepitant (EMEND) 150 mg in sodium chloride 0.9 % 145 mL IVPB      oxaliplatin (ELOXATIN) 160 mg in dextrose 5 % 500 mL chemo infusion      leucovorin 744 mg in dextrose 5 % 250 mL infusion      fluorouracil (ADRUCIL) chemo injection 750 mg      fluorouracil (ADRUCIL) 4,450 mg in sodium chloride 0.9 % 150 mL chemo infusion      sodium chloride 0.9 % injection 10 mL      heparin lock flush 100 unit/mL      heparin lock flush 100 unit/mL      alteplase (CATHFLO ACTIVASE) injection 2 mg      sodium chloride 0.9 % injection 3 mL      Cold Pack 1 packet      Hot Pack 1 packet      dextrose 5 % solution      TREATMENT CONDITIONS 03/22/2014      SCHEDULING COMMUNICATION      sodium chloride 0.9 % injection 10 mL      heparin lock flush 100 unit/mL      heparin lock flush 100 unit/mL      alteplase (CATHFLO ACTIVASE) injection 2 mg      sodium chloride 0.9 % injection 3  mL      Cold Pack 1 packet 04/03/2014      SCHEDULING COMMUNICATION      dexamethasone (DECADRON) injection 10 mg      palonosetron (ALOXI) injection 0.25 mg      fosaprepitant (EMEND) 150 mg in sodium chloride 0.9 % 145 mL IVPB      oxaliplatin (ELOXATIN) 160 mg in dextrose 5 % 500 mL chemo infusion      leucovorin 744 mg in dextrose 5 % 250 mL infusion      fluorouracil (ADRUCIL) chemo injection 750 mg      fluorouracil (ADRUCIL) 4,450 mg in sodium chloride 0.9 % 150 mL chemo infusion      sodium chloride 0.9 % injection 10 mL      heparin lock flush 100 unit/mL      heparin lock flush 100 unit/mL      alteplase (CATHFLO ACTIVASE) injection 2 mg      sodium chloride 0.9 % injection 3 mL      Cold Pack 1 packet      Hot Pack 1 packet      dextrose  5 % solution      TREATMENT CONDITIONS    ROS  Past Medical History  Diagnosis Date  . Hypertension   . Anemia   . GERD (gastroesophageal reflux disease)     no meds    Past Surgical History  Procedure Laterality Date  . Appendectomy    . Esophagogastroduodenoscopy N/A 01/10/2014    Procedure: ESOPHAGOGASTRODUODENOSCOPY (EGD);  Surgeon: Irene Shipper, MD;  Location: [facility];  Service: Endoscopy;  Laterality: N/A;  . Colonoscopy N/A 01/10/2014    Procedure: COLONOSCOPY;  Surgeon: Irene Shipper, MD;  Location: [facility];  Service: Endoscopy;  Laterality: N/A;  . Savory dilation N/A 01/10/2014    Procedure: SAVORY DILATION;  Surgeon: Irene Shipper, MD;  Location: [facility];  Service: Endoscopy;  Laterality: N/A;  Please verify with Henrene Pastor what type of dilation he will use  . Laparotomy N/A 01/11/2014    Procedure: EXPLORATORY LAPAROTOMY;  Surgeon: Armandina Gemma, MD;  Location: [facility];  Service: General;  Laterality: N/A;  . Partial colectomy N/A 01/11/2014    Procedure: PARTIAL COLECTOMY;  Surgeon: Armandina Gemma, MD;  Location: [facility];  Service: General;  Laterality: N/A;  . Portacath placement N/A 02/18/2014    Procedure:  INSERTION PORT-A-CATH LEFT SUBCLAVIAN VIEN;  Surgeon: Armandina Gemma, MD;  Location: [facility];  Service: General;  Laterality: N/A;    has FIBROIDS, UTERUS; OBESITY; SUBSTANCE ABUSE, MULTIPLE; DEPRESSION; CATARACTS; INTERNAL HEMORRHOIDS WITHOUT MENTION COMP; CONSTIPATION, CHRONIC; APPENDICITIS, HX OF; HX, PERSONAL, TOBACCO USE; Abdominal pain; Dysphagia, pharyngoesophageal phase; HTN (hypertension); Abnormal CT of the abdomen; Tobacco use disorder; Colonic mass; Anemia, iron deficiency; Abnormal CT scan, gastrointestinal tract; Esophageal stricture; Colon cancer; Colon adenocarcinoma; Gastroesophageal reflux disease with esophagitis; and Nausea with vomiting on her problem list.    has No Known Allergies.    Medication List       This list is accurate as of: 03/13/14  1:21 PM.  Always use your most recent med list.               amLODipine 10 MG tablet  Commonly known as:  NORVASC  Take 1 tablet (10 mg total) by mouth daily.     ferrous sulfate 325 (65 FE) MG tablet  Take 1 tablet (325 mg total) by mouth 2 (two) times daily with a meal.     HYDROcodone-acetaminophen 5-325 MG per tablet  Commonly known as:  NORCO/VICODIN  Take 1-2 tablets by mouth every 4 (four) hours as needed for moderate pain or severe pain.     lidocaine-prilocaine cream  Commonly known as:  EMLA  Place small amount over port area 1-2 hours prior to treatment and cover with plastic wrap.  DO NOT RUB IN     nicotine 14 mg/24hr patch  Commonly known as:  NICODERM CQ - dosed in mg/24 hours  Place 1 patch (14 mg total) onto the skin daily.     ondansetron 8 MG disintegrating tablet  Commonly known as:  ZOFRAN ODT  Take 1 tablet (8 mg total) by mouth every 6 (six) hours as needed for nausea or vomiting.     oxyCODONE 5 MG immediate release tablet  Commonly known as:  Oxy IR/ROXICODONE  Take 1-2 tablets (5-10 mg total) by mouth every 6 (six) hours as needed for moderate pain.     prochlorperazine  10 MG tablet  Commonly known as:  COMPAZINE  Take 1 tablet (10 mg total) by mouth every 6 (six) hours as needed  for nausea or vomiting.         PHYSICAL EXAMINATION  Blood pressure 150/97, pulse 86, temperature 98.7 F (37.1 C), temperature source Oral, resp. rate 18, height _0  (1.575 m), weight 173 lb (78.472 kg), SpO2 100 %.  Physical Exam  Constitutional: She is oriented to person, place, and time and well-developed, well-nourished, and in no distress.  HENT:  Head: Normocephalic and atraumatic.  Mouth/Throat: Oropharynx is clear and moist.  Eyes: Conjunctivae and EOM are normal. Pupils are equal, round, and reactive to light. Right eye exhibits no discharge. Left eye exhibits no discharge. No scleral icterus.  Neck: Normal range of motion. Neck supple. No JVD present. No tracheal deviation present. No thyromegaly present.  Cardiovascular: Normal rate, regular rhythm, normal heart sounds and intact distal pulses.   Pulmonary/Chest: Effort normal and breath sounds normal. No respiratory distress. She has no wheezes. She has no rales. She exhibits no tenderness.  Abdominal: Soft. Bowel sounds are normal. She exhibits no distension and no mass. There is no tenderness. There is no rebound and no guarding.  Musculoskeletal: Normal range of motion. She exhibits no edema or tenderness.  Lymphadenopathy:    She has no cervical adenopathy.  Neurological: She is alert and oriented to person, place, and time. Gait normal.  Skin: Skin is warm and dry. No rash noted. No erythema.  Psychiatric: Affect normal.  Nursing note and vitals reviewed.   LABORATORY DATA:. Appointment on 03/13/2014  Component Date Value Ref Range Status  . WBC 03/13/2014 4.1  3.9 - 10.3 10e3/uL Final  . NEUT# 03/13/2014 2.7  1.5 - 6.5 10e3/uL Final  . HGB 03/13/2014 11.0* 11.6 - 15.9 g/dL Final  . HCT 03/13/2014 37.7  34.8 - 46.6 % Final  . Platelets 03/13/2014 230  145 - 400 10e3/uL Final  . MCV 03/13/2014 79.9   79.5 - 101.0 fL Final  . MCH 03/13/2014 23.3* 25.1 - 34.0 pg Final  . MCHC 03/13/2014 29.2* 31.5 - 36.0 g/dL Final  . RBC 03/13/2014 4.72  3.70 - 5.45 10e6/uL Final  . RDW 03/13/2014 22.2* 11.2 - 14.5 % Final  . lymph# 03/13/2014 1.1  0.9 - 3.3 10e3/uL Final  . MONO# 03/13/2014 0.3  0.1 - 0.9 10e3/uL Final  . Eosinophils Absolute 03/13/2014 0.1  0.0 - 0.5 10e3/uL Final  . Basophils Absolute 03/13/2014 0.0  0.0 - 0.1 10e3/uL Final  . NEUT% 03/13/2014 65.8  38.4 - 76.8 % Final  . LYMPH% 03/13/2014 25.8  14.0 - 49.7 % Final  . MONO% 03/13/2014 6.4  0.0 - 14.0 % Final  . EOS% 03/13/2014 1.5  0.0 - 7.0 % Final  . BASO% 03/13/2014 0.5  0.0 - 2.0 % Final  . Sodium 03/13/2014 145  136 - 145 mEq/L Final  . Potassium 03/13/2014 3.7  3.5 - 5.1 mEq/L Final  . Chloride 03/13/2014 110* 98 - 109 mEq/L Final  . CO2 03/13/2014 28  22 - 29 mEq/L Final  . Glucose 03/13/2014 123  70 - 140 mg/dl Final  . BUN 03/13/2014 15.0  7.0 - 26.0 mg/dL Final  . Creatinine 03/13/2014 1.1  0.6 - 1.1 mg/dL Final  . Total Bilirubin 03/13/2014 0.24  0.20 - 1.20 mg/dL Final  . Alkaline Phosphatase 03/13/2014 93  40 - 150 U/L Final  . AST 03/13/2014 14  5 - 34 U/L Final  . ALT 03/13/2014 8  0 - 55 U/L Final  . Total Protein 03/13/2014 6.7  6.4 - 8.3 g/dL Final  .  Albumin 03/13/2014 3.6  3.5 - 5.0 g/dL Final  . Calcium 03/13/2014 9.0  8.4 - 10.4 mg/dL Final  . Anion Gap 03/13/2014 7  3 - 11 mEq/L Final  . EGFR 03/13/2014 62* >90 ml/min/1.73 m2 Final   eGFR is calculated using the CKD-EPI Creatinine Equation (2009)     RADIOGRAPHIC STUDIES: No results found.  ASSESSMENT/PLAN:    Colon cancer Patient completed cycle 2 of her FOLFOX chemotherapy on 03/06/2014.  She is scheduled to return for cycle 3 of the same regimen on 03/20/2014.  Of note-patient did once again developed some delayed nausea/vomiting following this last cycle of chemotherapy despite the addition of both Aloxi and Emend.   Nausea with  vomiting Patient completed cycle 2 of FOLFOX chemotherapy on 03/06/2014.  She reports some delayed nausea/vomiting for the past 24-48 hours; despite the addition of both Emend and Aloxi as premedications with the last cycle of her chemotherapy.  She states she's only vomited 2 actual times within that time. But states that taking Compazine has been fairly ineffective.  She is requesting a prescription for Zofran ODT.  Patient denies feeling dehydrated today; stating that she continues to drink and eat fairly well.  Advised patient to let us know she does feel dehydrated; we will be glad to give her some IV fluid rehydration.  Patient was given a prescription for Zofran ODT today.   Also, reviewed patient's labs from today; and all fairly stable.    Patient stated understanding of all instructions; and was in agreement with this plan of care. The patient knows to call the clinic with any problems, questions or concerns.   Review/collaboration with Dr. Benay Spice regarding all aspects of patient's visit today.   Total time spent with patient was 25 minutes;  with greater than 75 percent of that time spent in face to face counseling regarding patient's symptoms,  and coordination of care and follow up.  Disclaimer: This note was dictated with voice recognition software. Similar sounding words can inadvertently be transcribed and may not be corrected upon review.   Drue Second, NP 03/13/2014

## 2014-03-13 NOTE — Telephone Encounter (Signed)
Pt c/o throwing up for 2 days. Went to PCP but did not stay d/t throwing up. Called back at 1017 and no answer so lvm.

## 2014-03-13 NOTE — Telephone Encounter (Signed)
per pof to sch [facility]-pt aware

## 2014-03-13 NOTE — Assessment & Plan Note (Signed)
Patient completed cycle 2 of her FOLFOX chemotherapy on 03/06/2014.  She is scheduled to return for cycle 3 of the same regimen on 03/20/2014.  Of note-patient did once again developed some delayed nausea/vomiting following this last cycle of chemotherapy despite the addition of both Aloxi and Emend.

## 2014-03-13 NOTE — Assessment & Plan Note (Signed)
Patient completed cycle 2 of FOLFOX chemotherapy on 03/06/2014.  She reports some delayed nausea/vomiting for the past 24-48 hours; despite the addition of both Emend and Aloxi as premedications with the last cycle of her chemotherapy.  She states she's only vomited 2 actual times within that time. But states that taking Compazine has been fairly ineffective.  She is requesting a prescription for Zofran ODT.  Patient denies feeling dehydrated today; stating that she continues to drink and eat fairly well.  Advised patient to let us know she does feel dehydrated; we will be glad to give her some IV fluid rehydration.  Patient was given a prescription for Zofran ODT today.

## 2014-03-14 ENCOUNTER — Telehealth: Payer: Self-pay | Admitting: *Deleted

## 2014-03-14 NOTE — Telephone Encounter (Signed)
Per Selena Lesser, NP; left voice message to call [facility] re: update on pt symptoms, if N/V better after change in meds.

## 2014-03-16 ENCOUNTER — Other Ambulatory Visit: Payer: Self-pay | Admitting: Oncology

## 2014-03-20 ENCOUNTER — Ambulatory Visit (HOSPITAL_BASED_OUTPATIENT_CLINIC_OR_DEPARTMENT_OTHER): Payer: Medicare Other | Admitting: Nurse Practitioner

## 2014-03-20 ENCOUNTER — Other Ambulatory Visit (HOSPITAL_BASED_OUTPATIENT_CLINIC_OR_DEPARTMENT_OTHER): Payer: Medicare Other

## 2014-03-20 ENCOUNTER — Telehealth: Payer: Self-pay | Admitting: Oncology

## 2014-03-20 ENCOUNTER — Ambulatory Visit (HOSPITAL_BASED_OUTPATIENT_CLINIC_OR_DEPARTMENT_OTHER): Payer: Medicare Other

## 2014-03-20 VITALS — BP 166/96 | HR 73 | Temp 98.8°F | Resp 18 | Ht 62.0 in | Wt 174.4 lb

## 2014-03-20 DIAGNOSIS — C182 Malignant neoplasm of ascending colon: Secondary | ICD-10-CM

## 2014-03-20 DIAGNOSIS — C189 Malignant neoplasm of colon, unspecified: Secondary | ICD-10-CM

## 2014-03-20 DIAGNOSIS — D509 Iron deficiency anemia, unspecified: Secondary | ICD-10-CM

## 2014-03-20 DIAGNOSIS — G4700 Insomnia, unspecified: Secondary | ICD-10-CM

## 2014-03-20 DIAGNOSIS — Z803 Family history of malignant neoplasm of breast: Secondary | ICD-10-CM

## 2014-03-20 DIAGNOSIS — Z5111 Encounter for antineoplastic chemotherapy: Secondary | ICD-10-CM

## 2014-03-20 DIAGNOSIS — R110 Nausea: Secondary | ICD-10-CM

## 2014-03-20 LAB — CBC WITH DIFFERENTIAL/PLATELET
BASO%: 1.7 % (ref 0.0–2.0)
BASOS ABS: 0.1 10*3/uL (ref 0.0–0.1)
EOS ABS: 0 10*3/uL (ref 0.0–0.5)
EOS%: 1.5 % (ref 0.0–7.0)
HCT: 37.1 % (ref 34.8–46.6)
HEMOGLOBIN: 11 g/dL — AB (ref 11.6–15.9)
LYMPH#: 0.9 10*3/uL (ref 0.9–3.3)
LYMPH%: 29.2 % (ref 14.0–49.7)
MCH: 23.2 pg — ABNORMAL LOW (ref 25.1–34.0)
MCHC: 29.6 g/dL — ABNORMAL LOW (ref 31.5–36.0)
MCV: 78.6 fL — ABNORMAL LOW (ref 79.5–101.0)
MONO#: 0.3 10*3/uL (ref 0.1–0.9)
MONO%: 9.7 % (ref 0.0–14.0)
NEUT#: 1.8 10*3/uL (ref 1.5–6.5)
NEUT%: 57.9 % (ref 38.4–76.8)
Platelets: 159 10*3/uL (ref 145–400)
RBC: 4.73 10*6/uL (ref 3.70–5.45)
RDW: 25.2 % — AB (ref 11.2–14.5)
WBC: 3.1 10*3/uL — AB (ref 3.9–10.3)

## 2014-03-20 LAB — COMPREHENSIVE METABOLIC PANEL (CC13)
ALK PHOS: 92 U/L (ref 40–150)
ALT: 8 U/L (ref 0–55)
AST: 15 U/L (ref 5–34)
Albumin: 3.5 g/dL (ref 3.5–5.0)
Anion Gap: 6 mEq/L (ref 3–11)
BILIRUBIN TOTAL: 0.25 mg/dL (ref 0.20–1.20)
BUN: 14.3 mg/dL (ref 7.0–26.0)
CO2: 29 meq/L (ref 22–29)
CREATININE: 1.2 mg/dL — AB (ref 0.6–1.1)
Calcium: 9.1 mg/dL (ref 8.4–10.4)
Chloride: 108 mEq/L (ref 98–109)
EGFR: 53 mL/min/{1.73_m2} — ABNORMAL LOW (ref >90)
Glucose: 93 mg/dl (ref 70–140)
Potassium: 3.6 mEq/L (ref 3.5–5.1)
SODIUM: 144 meq/L (ref 136–145)
TOTAL PROTEIN: 6.7 g/dL (ref 6.4–8.3)

## 2014-03-20 MED ORDER — PALONOSETRON HCL INJECTION 0.25 MG/5ML
0.2500 mg | Freq: Once | INTRAVENOUS | Status: AC
  Administered 2014-03-20: 0.25 mg via INTRAVENOUS

## 2014-03-20 MED ORDER — SODIUM CHLORIDE 0.9 % IV SOLN
2400.0000 mg/m2 | INTRAVENOUS | Status: DC
  Administered 2014-03-20: 4450 mg via INTRAVENOUS
  Filled 2014-03-20: qty 89

## 2014-03-20 MED ORDER — FLUOROURACIL CHEMO INJECTION 2.5 GM/50ML
400.0000 mg/m2 | Freq: Once | INTRAVENOUS | Status: AC
  Administered 2014-03-20: 750 mg via INTRAVENOUS
  Filled 2014-03-20: qty 15

## 2014-03-20 MED ORDER — PALONOSETRON HCL INJECTION 0.25 MG/5ML
INTRAVENOUS | Status: AC
  Filled 2014-03-20: qty 5

## 2014-03-20 MED ORDER — LORAZEPAM 0.5 MG PO TABS: 0.5000 mg | ORAL_TABLET | Freq: Three times a day (TID) | ORAL | Status: DC | PRN

## 2014-03-20 MED ORDER — DEXTROSE 5 % IV SOLN
85.0000 mg/m2 | Freq: Once | INTRAVENOUS | Status: AC
  Administered 2014-03-20: 160 mg via INTRAVENOUS
  Filled 2014-03-20: qty 32

## 2014-03-20 MED ORDER — DEXAMETHASONE 4 MG PO TABS
ORAL_TABLET | ORAL | Status: DC
Start: 2014-03-20 — End: 2014-07-18

## 2014-03-20 MED ORDER — LEUCOVORIN CALCIUM INJECTION 350 MG
400.0000 mg/m2 | Freq: Once | INTRAVENOUS | Status: AC
  Administered 2014-03-20: 744 mg via INTRAVENOUS
  Filled 2014-03-20: qty 37.2

## 2014-03-20 MED ORDER — DEXTROSE 5 % IV SOLN
Freq: Once | INTRAVENOUS | Status: AC
  Administered 2014-03-20: 13:00:00 via INTRAVENOUS

## 2014-03-20 MED ORDER — DEXAMETHASONE SODIUM PHOSPHATE 10 MG/ML IJ SOLN
10.0000 mg | Freq: Once | INTRAMUSCULAR | Status: AC
  Administered 2014-03-20: 10 mg via INTRAVENOUS

## 2014-03-20 MED ORDER — FOSAPREPITANT DIMEGLUMINE INJECTION 150 MG
150.0000 mg | Freq: Once | INTRAVENOUS | Status: AC
  Administered 2014-03-20: 150 mg via INTRAVENOUS
  Filled 2014-03-20: qty 5

## 2014-03-20 MED ORDER — DEXAMETHASONE SODIUM PHOSPHATE 10 MG/ML IJ SOLN
INTRAMUSCULAR | Status: AC
  Filled 2014-03-20: qty 1

## 2014-03-20 NOTE — Progress Notes (Signed)
Pt requests to have pump disconnected at 1200 on 2/20. Reviewed with Ned Card, NP for Dr. Benay Spice: OK to discard remaining Fluorouracil. Pt made aware OK DC pump 2 hours early.

## 2014-03-20 NOTE — Patient Instructions (Signed)
[facility] Discharge Instructions for Patients Receiving Chemotherapy  Today you received the following chemotherapy agents: Oxaliplatin, Leucovorin, and Adrucil.  To help prevent nausea and vomiting after your treatment, we encourage you to take your nausea medication as prescribed.   If you develop nausea and vomiting that is not controlled by your nausea medication, call the clinic.   BELOW ARE SYMPTOMS THAT SHOULD BE REPORTED IMMEDIATELY:  *FEVER GREATER THAN 100.5 F  *CHILLS WITH OR WITHOUT FEVER  NAUSEA AND VOMITING THAT IS NOT CONTROLLED WITH YOUR NAUSEA MEDICATION  *UNUSUAL SHORTNESS OF BREATH  *UNUSUAL BRUISING OR BLEEDING  TENDERNESS IN MOUTH AND THROAT WITH OR WITHOUT PRESENCE OF ULCERS  *URINARY PROBLEMS  *BOWEL PROBLEMS  UNUSUAL RASH Items with * indicate a potential emergency and should be followed up as soon as possible.  Feel free to call the clinic you have any questions or concerns. The clinic phone number is [phone].    

## 2014-03-20 NOTE — Telephone Encounter (Signed)
Pt confirmed labs/ov per 02/18 POF, gave pt AVS.... KJ, sent msg to add chemo °

## 2014-03-20 NOTE — Progress Notes (Signed)
  [place] OFFICE PROGRESS NOTE   Diagnosis:  Colon cancer  INTERVAL HISTORY:   Gail Carlson returns as scheduled. She completed cycle 2 FOLFOX 03/06/2014. She developed nausea/vomiting 3-4 days after the chemotherapy. This lasted for 2 days. Compazine was ineffective. She tried Zofran ODT which helped. She had loose stools for one day. She notes her hands are becoming dark. No redness or pain. No mouth sores. No neuropathy symptoms. Main complaint today is insomnia.  Objective:  Vital signs in last 24 hours:  Blood pressure 166/96, pulse 73, temperature 98.8 F (37.1 C), temperature source Oral, resp. rate 18, height $RemoveBe'5\' 2"'oWrNSvtIg$  (1.575 m), weight 174 lb 6.4 oz (79.107 kg), SpO2 100 %.    HEENT: No thrush or ulcers. Resp: Lungs clear bilaterally. Cardio: Regular rate and rhythm. GI: Abdomen soft and nontender. No hepatomegaly. Vascular: No leg edema.  Skin: Hands with hyperpigmentation. No erythema or skin breakdown.    Lab Results:  Lab Results  Component Value Date   WBC 3.1* 03/20/2014   HGB 11.0* 03/20/2014   HCT 37.1 03/20/2014   MCV 78.6* 03/20/2014   PLT 159 03/20/2014   NEUTROABS 1.8 03/20/2014    Imaging:  No results found.  Medications: I have reviewed the patient's current medications.  Assessment/Plan: 1. 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  CTs of the chest, abdomen, and pelvis on 01/10/2014 with a cecal mass, nonspecific small liver lesions, and nonspecific lung nodules felt to most likely represent mucoid impaction  Cycle 1 FOLFOX 02/20/2014  Cycle 2 FOLFOX 03/06/2014  Cycle 3 FOLFOX 03/20/2014  2. Iron deficiency anemia 3. Family history of breast cancer 4. Port-A-Cath placement 02/18/2014 5. Delayed nausea following cycle 1 FOLFOX. Aloxi and Emend added  beginning with cycle 2. Prophylactic Decadron added beginning with cycle 3.   Disposition: Gail Carlson appears stable. She has completed 2 cycles of FOLFOX. Plan to proceed with cycle 3 today as scheduled.   She had delayed nausea following cycle 2 despite Aloxi and Emend. We will add prophylactic Decadron with cycle 3. She will take 8 mg twice daily for 2 days beginning day 2. She was also given a prescription for Ativan 0.5 mg every 8 hours as needed for nausea/sleep. She is aware that she should not drive while taking Ativan.  She will return for a follow-up visit and cycle 4 FOLFOX in 2 weeks. She will contact the office in the interim with any problems. We specifically discussed nausea despite the above measures.    Ned Card ANP/GNP-BC   03/20/2014  12:01 PM

## 2014-03-21 ENCOUNTER — Telehealth: Payer: Self-pay | Admitting: *Deleted

## 2014-03-21 NOTE — Telephone Encounter (Signed)
Per staff message and POF I have scheduled appts. Advised scheduler of appts. JMW  

## 2014-03-21 NOTE — Telephone Encounter (Signed)
PRESCRIPTION WAS CALLED TO PHARMACY ON 03/20/14 PER CHART. PHARMACY DID NOT RECEIVE. PRESCRIPTION CALLED TO PHARMACY.

## 2014-03-22 ENCOUNTER — Ambulatory Visit (HOSPITAL_BASED_OUTPATIENT_CLINIC_OR_DEPARTMENT_OTHER): Payer: Medicare Other

## 2014-03-22 DIAGNOSIS — C182 Malignant neoplasm of ascending colon: Secondary | ICD-10-CM

## 2014-03-22 DIAGNOSIS — C189 Malignant neoplasm of colon, unspecified: Secondary | ICD-10-CM

## 2014-03-22 MED ORDER — SODIUM CHLORIDE 0.9 % IJ SOLN
10.0000 mL | INTRAMUSCULAR | Status: DC | PRN
  Administered 2014-03-22: 10 mL
  Filled 2014-03-22: qty 10

## 2014-03-22 MED ORDER — HEPARIN SOD (PORK) LOCK FLUSH 100 UNIT/ML IV SOLN
500.0000 [IU] | Freq: Once | INTRAVENOUS | Status: AC | PRN
  Administered 2014-03-22: 500 [IU]
  Filled 2014-03-22: qty 5

## 2014-03-30 ENCOUNTER — Other Ambulatory Visit: Payer: Self-pay | Admitting: Oncology

## 2014-04-03 ENCOUNTER — Encounter: Payer: Self-pay | Admitting: *Deleted

## 2014-04-03 ENCOUNTER — Telehealth: Payer: Self-pay | Admitting: Oncology

## 2014-04-03 ENCOUNTER — Other Ambulatory Visit: Payer: Medicare Other

## 2014-04-03 ENCOUNTER — Ambulatory Visit (HOSPITAL_BASED_OUTPATIENT_CLINIC_OR_DEPARTMENT_OTHER): Payer: Medicare Other | Admitting: Oncology

## 2014-04-03 ENCOUNTER — Ambulatory Visit (HOSPITAL_BASED_OUTPATIENT_CLINIC_OR_DEPARTMENT_OTHER): Payer: Medicare Other

## 2014-04-03 ENCOUNTER — Other Ambulatory Visit (HOSPITAL_BASED_OUTPATIENT_CLINIC_OR_DEPARTMENT_OTHER): Payer: Medicare Other

## 2014-04-03 VITALS — BP 196/75 | HR 58 | Temp 98.4°F | Resp 20 | Ht 62.0 in | Wt 172.8 lb

## 2014-04-03 DIAGNOSIS — C180 Malignant neoplasm of cecum: Secondary | ICD-10-CM

## 2014-04-03 DIAGNOSIS — C189 Malignant neoplasm of colon, unspecified: Secondary | ICD-10-CM

## 2014-04-03 DIAGNOSIS — Z5111 Encounter for antineoplastic chemotherapy: Secondary | ICD-10-CM

## 2014-04-03 DIAGNOSIS — D509 Iron deficiency anemia, unspecified: Secondary | ICD-10-CM

## 2014-04-03 DIAGNOSIS — Z803 Family history of malignant neoplasm of breast: Secondary | ICD-10-CM

## 2014-04-03 LAB — COMPREHENSIVE METABOLIC PANEL (CC13)
ALK PHOS: 85 U/L (ref 40–150)
ALT: 7 U/L (ref 0–55)
AST: 11 U/L (ref 5–34)
Albumin: 3.5 g/dL (ref 3.5–5.0)
Anion Gap: 10 mEq/L (ref 3–11)
BILIRUBIN TOTAL: 0.32 mg/dL (ref 0.20–1.20)
BUN: 18.3 mg/dL (ref 7.0–26.0)
CALCIUM: 9 mg/dL (ref 8.4–10.4)
CO2: 26 meq/L (ref 22–29)
Chloride: 106 mEq/L (ref 98–109)
Creatinine: 1.1 mg/dL (ref 0.6–1.1)
EGFR: 63 mL/min/{1.73_m2} — ABNORMAL LOW (ref >90)
Glucose: 135 mg/dl (ref 70–140)
Potassium: 3.3 mEq/L — ABNORMAL LOW (ref 3.5–5.1)
SODIUM: 143 meq/L (ref 136–145)
Total Protein: 6.4 g/dL (ref 6.4–8.3)

## 2014-04-03 LAB — CBC WITH DIFFERENTIAL/PLATELET
BASO%: 0 % (ref 0.0–2.0)
Basophils Absolute: 0 10*3/uL (ref 0.0–0.1)
EOS%: 0 % (ref 0.0–7.0)
Eosinophils Absolute: 0 10*3/uL (ref 0.0–0.5)
HEMATOCRIT: 36.3 % (ref 34.8–46.6)
HGB: 11.3 g/dL — ABNORMAL LOW (ref 11.6–15.9)
LYMPH%: 25.3 % (ref 14.0–49.7)
MCH: 25.5 pg (ref 25.1–34.0)
MCHC: 31.1 g/dL — AB (ref 31.5–36.0)
MCV: 81.8 fL (ref 79.5–101.0)
MONO#: 0.5 10*3/uL (ref 0.1–0.9)
MONO%: 13.3 % (ref 0.0–14.0)
NEUT#: 2.3 10*3/uL (ref 1.5–6.5)
NEUT%: 61.4 % (ref 38.4–76.8)
PLATELETS: 110 10*3/uL — AB (ref 145–400)
RBC: 4.44 10*6/uL (ref 3.70–5.45)
RDW: 22.7 % — ABNORMAL HIGH (ref 11.2–14.5)
WBC: 3.7 10*3/uL — ABNORMAL LOW (ref 3.9–10.3)
lymph#: 0.9 10*3/uL (ref 0.9–3.3)
nRBC: 0 % (ref 0–0)

## 2014-04-03 MED ORDER — PALONOSETRON HCL INJECTION 0.25 MG/5ML
0.2500 mg | Freq: Once | INTRAVENOUS | Status: AC
  Administered 2014-04-03: 0.25 mg via INTRAVENOUS

## 2014-04-03 MED ORDER — DEXAMETHASONE SODIUM PHOSPHATE 10 MG/ML IJ SOLN
INTRAMUSCULAR | Status: AC
  Filled 2014-04-03: qty 1

## 2014-04-03 MED ORDER — SODIUM CHLORIDE 0.9 % IV SOLN
Freq: Once | INTRAVENOUS | Status: AC
  Administered 2014-04-03: 10:00:00 via INTRAVENOUS

## 2014-04-03 MED ORDER — OXALIPLATIN CHEMO INJECTION 100 MG/20ML
85.0000 mg/m2 | Freq: Once | INTRAVENOUS | Status: AC
  Administered 2014-04-03: 160 mg via INTRAVENOUS
  Filled 2014-04-03: qty 32

## 2014-04-03 MED ORDER — SODIUM CHLORIDE 0.9 % IV SOLN
150.0000 mg | Freq: Once | INTRAVENOUS | Status: AC
  Administered 2014-04-03: 150 mg via INTRAVENOUS
  Filled 2014-04-03: qty 5

## 2014-04-03 MED ORDER — SODIUM CHLORIDE 0.9 % IV SOLN
2400.0000 mg/m2 | INTRAVENOUS | Status: DC
  Administered 2014-04-03: 4450 mg via INTRAVENOUS
  Filled 2014-04-03: qty 89

## 2014-04-03 MED ORDER — PALONOSETRON HCL INJECTION 0.25 MG/5ML
INTRAVENOUS | Status: AC
  Filled 2014-04-03: qty 5

## 2014-04-03 MED ORDER — DEXTROSE 5 % IV SOLN
400.0000 mg/m2 | Freq: Once | INTRAVENOUS | Status: AC
  Administered 2014-04-03: 744 mg via INTRAVENOUS
  Filled 2014-04-03: qty 37.2

## 2014-04-03 MED ORDER — DEXTROSE 5 % IV SOLN
Freq: Once | INTRAVENOUS | Status: AC
  Administered 2014-04-03: 11:00:00 via INTRAVENOUS

## 2014-04-03 MED ORDER — DEXAMETHASONE SODIUM PHOSPHATE 10 MG/ML IJ SOLN
10.0000 mg | Freq: Once | INTRAMUSCULAR | Status: AC
  Administered 2014-04-03: 10 mg via INTRAVENOUS

## 2014-04-03 MED ORDER — FLUOROURACIL CHEMO INJECTION 2.5 GM/50ML
400.0000 mg/m2 | Freq: Once | INTRAVENOUS | Status: AC
  Administered 2014-04-03: 750 mg via INTRAVENOUS
  Filled 2014-04-03: qty 15

## 2014-04-03 NOTE — Progress Notes (Signed)
Met with patient  during  patient visit. Explained the role of the GI Nurse Navigator and and inquired if there are any needs or concerns or knowledge gaps she may have. She reports doing well and daughter is her caregiver and taking good care of her. Asking how to get the process started to have her daughter certified by Medicaid so she can received payment for what she does. Will follow up with social worker on how she is approach this. Merceda Elks, RN, BSN  GI [facility] [place]

## 2014-04-03 NOTE — Patient Instructions (Signed)
[facility] Discharge Instructions for Patients Receiving Chemotherapy  Today you received the following chemotherapy agents 5 FU/ Leucovorin/Oxaliplatin  To help prevent nausea and vomiting after your treatment, we encourage you to take your nausea medication as prescribed.  If you develop nausea and vomiting that is not controlled by your nausea medication, call the clinic.   BELOW ARE SYMPTOMS THAT SHOULD BE REPORTED IMMEDIATELY:  *FEVER GREATER THAN 100.5 F  *CHILLS WITH OR WITHOUT FEVER  NAUSEA AND VOMITING THAT IS NOT CONTROLLED WITH YOUR NAUSEA MEDICATION  *UNUSUAL SHORTNESS OF BREATH  *UNUSUAL BRUISING OR BLEEDING  TENDERNESS IN MOUTH AND THROAT WITH OR WITHOUT PRESENCE OF ULCERS  *URINARY PROBLEMS  *BOWEL PROBLEMS  UNUSUAL RASH Items with * indicate a potential emergency and should be followed up as soon as possible.  Feel free to call the clinic you have any questions or concerns. The clinic phone number is [phone].     

## 2014-04-03 NOTE — Telephone Encounter (Signed)
gv and printed appt sched and avs for pt for March and April...sed added tx. °

## 2014-04-03 NOTE — Telephone Encounter (Signed)
Gave calendar for March & April. With new appointment for Genetics.

## 2014-04-03 NOTE — Progress Notes (Signed)
  [place] [place] OFFICE PROGRESS NOTE   Diagnosis: Colon cancer  INTERVAL HISTORY:   Gail Carlson returns as scheduled. She completed cycle 3 of chemotherapy on 03/20/2014. She reports significant improvement in nausea when taking postchemotherapy Decadron. She continues to have intermittent soreness at the left upper arm. Mild diarrhea following chemotherapy. No neuropathy symptoms at present.  Objective:  Vital signs in last 24 hours:  Blood pressure 196/75, pulse 58, temperature 98.4 F (36.9 C), temperature source Oral, resp. rate 20, height _0  (1.575 m), weight 172 lb 12.8 oz (78.382 kg).    HEENT: No thrush or ulcers Resp: Lungs clear bilaterally Cardio: Regular rate and rhythm GI: No hepatosplenomegaly, nontender Vascular: No leg edema, no edema of the left arm  Skin: Palms without erythema   Portacath/PICC-without erythema  Lab Results:  Lab Results  Component Value Date   WBC 3.7* 04/03/2014   HGB 11.3* 04/03/2014   HCT 36.3 04/03/2014   MCV 81.8 04/03/2014   PLT 110* 04/03/2014   NEUTROABS 2.3 04/03/2014     Medications: I have reviewed the patient's current medications.  Assessment/Plan: 1. 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  CTs of the chest, abdomen, and pelvis on 01/10/2014 with a cecal mass, nonspecific small liver lesions, and nonspecific lung nodules felt to most likely represent mucoid impaction  Cycle 1 FOLFOX 02/20/2014  Cycle 2 FOLFOX 03/06/2014  Cycle 3 FOLFOX 03/20/2014  Cycle 4 FOLFOX 04/03/2014  2. Iron deficiency anemia 3. Family history of breast cancer 4. Port-A-Cath placement 02/18/2014 5. Delayed nausea following cycle 1 FOLFOX. Aloxi and Emend added beginning with cycle 2. Prophylactic Decadron added beginning with cycle  3.   Disposition:  She appears well. She will complete cycle 4 of adjuvant FOLFOX today. Gail Carlson will return for an office visit and chemotherapy in 2 weeks.  [name], MD  04/03/2014  5:56 PM

## 2014-04-03 NOTE — Progress Notes (Signed)
[place] Work  Clinical Social Work was referred by patient navigator for assessment of psychosocial needs due to pt's request for caregiver support through [place].  Clinical Social Worker met with patient at [facility] during her infusion to offer support and assess for needs. Pt shared her daughter is helping her out so much that she wants to see if there is a program to pay her for her help. CSW and pt reviewed her ins and she has [employer] CSW explained the differences in medicare and medicaid. Pt now has a better understanding of the differences. CSW discussed resources through financial counseling for gas cards and reviewed [facility] Support Program calendar. Pt may attend GI Support group in the future. She also plans to apply for help through [place]. CSW also reviewed Pt and Family Support Team members that are available to assist her along her cancer journey. CSW discussed some common emotions pt experience and also reviewed coping techniques. CSW encouraged pt to reach out to CSW, MD and/or Pt and Family Support Team for support if anxiety and/or depression become an issue for her. Pt aware and agrees to contact as needed.   Clinical Social Work interventions: Resource education Supportive listening  Loren Racer, [place] Worker [place]  [place] Phone: [phone] Fax: [phone]

## 2014-04-05 ENCOUNTER — Ambulatory Visit (HOSPITAL_BASED_OUTPATIENT_CLINIC_OR_DEPARTMENT_OTHER): Payer: Medicare Other

## 2014-04-05 DIAGNOSIS — C189 Malignant neoplasm of colon, unspecified: Secondary | ICD-10-CM

## 2014-04-05 DIAGNOSIS — Z452 Encounter for adjustment and management of vascular access device: Secondary | ICD-10-CM

## 2014-04-05 DIAGNOSIS — C182 Malignant neoplasm of ascending colon: Secondary | ICD-10-CM

## 2014-04-05 MED ORDER — SODIUM CHLORIDE 0.9 % IJ SOLN
10.0000 mL | INTRAMUSCULAR | Status: DC | PRN
  Administered 2014-04-05: 10 mL
  Filled 2014-04-05: qty 10

## 2014-04-05 MED ORDER — HEPARIN SOD (PORK) LOCK FLUSH 100 UNIT/ML IV SOLN
500.0000 [IU] | Freq: Once | INTRAVENOUS | Status: AC | PRN
  Administered 2014-04-05: 500 [IU]
  Filled 2014-04-05: qty 5

## 2014-04-09 ENCOUNTER — Telehealth: Payer: Self-pay | Admitting: *Deleted

## 2014-04-09 NOTE — Telephone Encounter (Signed)
Patient called stating that she has had diarrhea for the last 2 days (3 loose stools per day). Patient states that she is drinking and eating well. Advised patient that she can take Immodium for the diarrhea and to call us back if symptoms do not improve. Patient verbalized understanding.

## 2014-04-13 ENCOUNTER — Other Ambulatory Visit: Payer: Self-pay | Admitting: Oncology

## 2014-04-17 ENCOUNTER — Ambulatory Visit (HOSPITAL_BASED_OUTPATIENT_CLINIC_OR_DEPARTMENT_OTHER): Payer: Medicare Other | Admitting: Nurse Practitioner

## 2014-04-17 ENCOUNTER — Other Ambulatory Visit: Payer: Self-pay

## 2014-04-17 ENCOUNTER — Telehealth: Payer: Self-pay | Admitting: Nurse Practitioner

## 2014-04-17 ENCOUNTER — Telehealth: Payer: Self-pay | Admitting: *Deleted

## 2014-04-17 ENCOUNTER — Ambulatory Visit: Payer: Medicare Other

## 2014-04-17 ENCOUNTER — Other Ambulatory Visit (HOSPITAL_BASED_OUTPATIENT_CLINIC_OR_DEPARTMENT_OTHER): Payer: Medicare Other

## 2014-04-17 VITALS — BP 165/86 | HR 65 | Temp 98.3°F | Resp 19 | Ht 62.0 in | Wt 170.9 lb

## 2014-04-17 DIAGNOSIS — E876 Hypokalemia: Secondary | ICD-10-CM

## 2014-04-17 DIAGNOSIS — C180 Malignant neoplasm of cecum: Secondary | ICD-10-CM

## 2014-04-17 DIAGNOSIS — C189 Malignant neoplasm of colon, unspecified: Secondary | ICD-10-CM

## 2014-04-17 DIAGNOSIS — I10 Essential (primary) hypertension: Secondary | ICD-10-CM

## 2014-04-17 DIAGNOSIS — D509 Iron deficiency anemia, unspecified: Secondary | ICD-10-CM

## 2014-04-17 DIAGNOSIS — M79622 Pain in left upper arm: Secondary | ICD-10-CM

## 2014-04-17 LAB — CBC WITH DIFFERENTIAL/PLATELET
BASO%: 1.4 % (ref 0.0–2.0)
BASOS ABS: 0 10*3/uL (ref 0.0–0.1)
EOS%: 0.4 % (ref 0.0–7.0)
Eosinophils Absolute: 0 10*3/uL (ref 0.0–0.5)
HCT: 37.7 % (ref 34.8–46.6)
HGB: 11.7 g/dL (ref 11.6–15.9)
LYMPH#: 1.5 10*3/uL (ref 0.9–3.3)
LYMPH%: 53.6 % — AB (ref 14.0–49.7)
MCH: 25.8 pg (ref 25.1–34.0)
MCHC: 31 g/dL — AB (ref 31.5–36.0)
MCV: 83.1 fL (ref 79.5–101.0)
MONO#: 0.4 10*3/uL (ref 0.1–0.9)
MONO%: 15.5 % — AB (ref 0.0–14.0)
NEUT#: 0.8 10*3/uL — ABNORMAL LOW (ref 1.5–6.5)
NEUT%: 29.1 % — ABNORMAL LOW (ref 38.4–76.8)
PLATELETS: 132 10*3/uL — AB (ref 145–400)
RBC: 4.54 10*6/uL (ref 3.70–5.45)
RDW: 26.5 % — ABNORMAL HIGH (ref 11.2–14.5)
WBC: 2.8 10*3/uL — AB (ref 3.9–10.3)

## 2014-04-17 LAB — COMPREHENSIVE METABOLIC PANEL (CC13)
ALT: 19 U/L (ref 0–55)
AST: 18 U/L (ref 5–34)
Albumin: 3.3 g/dL — ABNORMAL LOW (ref 3.5–5.0)
Alkaline Phosphatase: 76 U/L (ref 40–150)
Anion Gap: 9 mEq/L (ref 3–11)
BUN: 12.7 mg/dL (ref 7.0–26.0)
CO2: 27 mEq/L (ref 22–29)
Calcium: 8.3 mg/dL — ABNORMAL LOW (ref 8.4–10.4)
Chloride: 109 mEq/L (ref 98–109)
Creatinine: 1.2 mg/dL — ABNORMAL HIGH (ref 0.6–1.1)
EGFR: 56 mL/min/{1.73_m2} — ABNORMAL LOW (ref >90)
Glucose: 100 mg/dl (ref 70–140)
Potassium: 2.7 mEq/L — CL (ref 3.5–5.1)
Sodium: 145 mEq/L (ref 136–145)
Total Bilirubin: 0.24 mg/dL (ref 0.20–1.20)
Total Protein: 6.2 g/dL — ABNORMAL LOW (ref 6.4–8.3)

## 2014-04-17 MED ORDER — TRAMADOL HCL 50 MG PO TABS: 50.0000 mg | ORAL_TABLET | Freq: Two times a day (BID) | ORAL | Status: DC | PRN

## 2014-04-17 MED ORDER — LORAZEPAM 0.5 MG PO TABS: 0.5000 mg | ORAL_TABLET | Freq: Three times a day (TID) | ORAL | Status: DC | PRN

## 2014-04-17 MED ORDER — AMLODIPINE BESYLATE 10 MG PO TABS: 10.0000 mg | ORAL_TABLET | Freq: Every day | ORAL | Status: AC

## 2014-04-17 MED ORDER — POTASSIUM CHLORIDE CRYS ER 20 MEQ PO TBCR: EXTENDED_RELEASE_TABLET | ORAL | Status: DC

## 2014-04-17 NOTE — Progress Notes (Signed)
[place] OFFICE PROGRESS NOTE   Diagnosis:  Colon cancer  INTERVAL HISTORY:   Ms. Game returns as scheduled. She completed cycle 4 FOLFOX 04/03/2014. No significant nausea/vomiting. No mouth sores. She had loose stools for 2 days following the chemotherapy. Bowel habits have since returned to baseline. The cold sensitivity lasted 3-4 days. No persistent neuropathy symptoms. She denies any fevers. No shortness of breath. No hematuria or dysuria. She continues to have intermittent aching discomfort at the left upper arm.  Objective:  Vital signs in last 24 hours:  Blood pressure 165/86, pulse 65, temperature 98.3 F (36.8 C), temperature source Oral, resp. rate 19, height _0  (1.575 m), weight 170 lb 14.4 oz (77.52 kg), SpO2 100 %.    HEENT: No thrush or ulcers. Resp: Lungs clear bilaterally. Cardio: Regular rate and rhythm. GI: Abdomen soft and nontender. No hepatomegaly. Vascular: No upper or lower extremity edema. No venous engorgement over the chest. Neuro: Vibratory sense mildly decreased over the fingertips per tuning fork exam.  Skin: No rash.  Port-A-Cath site is without erythema.  Lab Results:  Lab Results  Component Value Date   WBC 2.8* 04/17/2014   HGB 11.7 04/17/2014   HCT 37.7 04/17/2014   MCV 83.1 04/17/2014   PLT 132* 04/17/2014   NEUTROABS 0.8* 04/17/2014    Imaging:  No results found.  Medications: I have reviewed the patient's current medications.  Assessment/Plan: 1. 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  CTs of the chest, abdomen, and pelvis on 01/10/2014 with a cecal mass, nonspecific small liver lesions, and nonspecific lung nodules felt to most likely represent mucoid impaction  Cycle 1 FOLFOX 02/20/2014  Cycle 2 FOLFOX 03/06/2014  Cycle 3 FOLFOX  03/20/2014  Cycle 4 FOLFOX 04/03/2014  Cycle 5 FOLFOX held on 04/17/2014 due to neutropenia  2. Iron deficiency anemia 3. Family history of breast cancer 4. Port-A-Cath placement 02/18/2014 5. Delayed nausea following cycle 1 FOLFOX. Aloxi and Emend added beginning with cycle 2. Prophylactic Decadron added beginning with cycle 3. 6. Neutropenia following cycle 4 FOLFOX. Neulasta will be added with cycle 5. 7. Hypokalemia 04/17/2014. Question secondary to diarrhea. Potassium supplement initiated. 8. Left upper arm pain. Negative venous Doppler 03/07/2014.   Disposition: Gail Carlson appears stable. She has complete 4 cycles of FOLFOX. She is neutropenic on labs today. We will hold today's treatment and reschedule for 1 week. Neulasta will be added on the day of pump discontinuation. We reviewed potential toxicities associate with Neulasta including bone pain, rash, splenic rupture. She understands to contact the office with fever, chills, other signs of infection.  She is hypokalemic. This may be due to diarrhea. She will begin a potassium supplement. We will obtain repeat labs when she returns in one week.  The etiology of the intermittent left upper arm pain is unclear. Venous Doppler was negative on 03/07/2014. We prescribed tramadol 50 mg every 12 hours as needed.  She will return for cycle 5 FOLFOX on 04/24/2014. We will see her in follow-up prior to cycle 6 on 05/08/2014. She will contact the office in the interim as outlined above or with any other problems.  Plan reviewed with Dr. Benay Spice. 25 minutes were spent face-to-face at today's visit with the majority of that time involved in counseling/coordination of care.  Ned Card ANP/GNP-BC   04/17/2014  8:58 AM

## 2014-04-17 NOTE — Telephone Encounter (Signed)
Per staff message and POF I have scheduled appts. Advised scheduler of appts. JMW  

## 2014-04-17 NOTE — Telephone Encounter (Signed)
Gave avs & calendar for March/April. Sent message to schedule treatment. Sent message to confirm appointment with LT.

## 2014-04-24 ENCOUNTER — Other Ambulatory Visit (HOSPITAL_BASED_OUTPATIENT_CLINIC_OR_DEPARTMENT_OTHER): Payer: Medicare Other

## 2014-04-24 ENCOUNTER — Telehealth: Payer: Self-pay | Admitting: *Deleted

## 2014-04-24 ENCOUNTER — Ambulatory Visit (HOSPITAL_BASED_OUTPATIENT_CLINIC_OR_DEPARTMENT_OTHER): Payer: Medicare Other

## 2014-04-24 DIAGNOSIS — C182 Malignant neoplasm of ascending colon: Secondary | ICD-10-CM

## 2014-04-24 DIAGNOSIS — Z5111 Encounter for antineoplastic chemotherapy: Secondary | ICD-10-CM | POA: Diagnosis not present

## 2014-04-24 DIAGNOSIS — E876 Hypokalemia: Secondary | ICD-10-CM

## 2014-04-24 DIAGNOSIS — C189 Malignant neoplasm of colon, unspecified: Secondary | ICD-10-CM

## 2014-04-24 LAB — CBC WITH DIFFERENTIAL/PLATELET
BASO%: 0.5 % (ref 0.0–2.0)
Basophils Absolute: 0 10*3/uL (ref 0.0–0.1)
EOS%: 1.2 % (ref 0.0–7.0)
Eosinophils Absolute: 0.1 10*3/uL (ref 0.0–0.5)
HEMATOCRIT: 40.9 % (ref 34.8–46.6)
HGB: 12.8 g/dL (ref 11.6–15.9)
LYMPH%: 23.6 % (ref 14.0–49.7)
MCH: 26.6 pg (ref 25.1–34.0)
MCHC: 31.3 g/dL — ABNORMAL LOW (ref 31.5–36.0)
MCV: 85 fL (ref 79.5–101.0)
MONO#: 0.6 10*3/uL (ref 0.1–0.9)
MONO%: 9.4 % (ref 0.0–14.0)
NEUT%: 65.3 % (ref 38.4–76.8)
NEUTROS ABS: 3.9 10*3/uL (ref 1.5–6.5)
NRBC: 0 % (ref 0–0)
Platelets: 149 10*3/uL (ref 145–400)
RBC: 4.81 10*6/uL (ref 3.70–5.45)
RDW: 22.8 % — AB (ref 11.2–14.5)
WBC: 5.9 10*3/uL (ref 3.9–10.3)
lymph#: 1.4 10*3/uL (ref 0.9–3.3)

## 2014-04-24 LAB — COMPREHENSIVE METABOLIC PANEL (CC13)
ALBUMIN: 3.4 g/dL — AB (ref 3.5–5.0)
ALT: 16 U/L (ref 0–55)
AST: 18 U/L (ref 5–34)
Alkaline Phosphatase: 90 U/L (ref 40–150)
Anion Gap: 9 mEq/L (ref 3–11)
BUN: 13.1 mg/dL (ref 7.0–26.0)
CO2: 27 meq/L (ref 22–29)
Calcium: 9 mg/dL (ref 8.4–10.4)
Chloride: 108 mEq/L (ref 98–109)
Creatinine: 1 mg/dL (ref 0.6–1.1)
EGFR: 73 mL/min/{1.73_m2} — AB (ref >90)
GLUCOSE: 107 mg/dL (ref 70–140)
Potassium: 3.4 mEq/L — ABNORMAL LOW (ref 3.5–5.1)
SODIUM: 144 meq/L (ref 136–145)
TOTAL PROTEIN: 6.8 g/dL (ref 6.4–8.3)
Total Bilirubin: 0.27 mg/dL (ref 0.20–1.20)

## 2014-04-24 MED ORDER — SODIUM CHLORIDE 0.9 % IV SOLN
Freq: Once | INTRAVENOUS | Status: AC
  Administered 2014-04-24: 10:00:00 via INTRAVENOUS
  Filled 2014-04-24: qty 5

## 2014-04-24 MED ORDER — PALONOSETRON HCL INJECTION 0.25 MG/5ML
0.2500 mg | Freq: Once | INTRAVENOUS | Status: AC
  Administered 2014-04-24: 0.25 mg via INTRAVENOUS

## 2014-04-24 MED ORDER — FLUOROURACIL CHEMO INJECTION 2.5 GM/50ML
400.0000 mg/m2 | Freq: Once | INTRAVENOUS | Status: AC
  Administered 2014-04-24: 750 mg via INTRAVENOUS
  Filled 2014-04-24: qty 15

## 2014-04-24 MED ORDER — DEXTROSE 5 % IV SOLN
Freq: Once | INTRAVENOUS | Status: AC
  Administered 2014-04-24: 10:00:00 via INTRAVENOUS

## 2014-04-24 MED ORDER — OXALIPLATIN CHEMO INJECTION 100 MG/20ML
85.0000 mg/m2 | Freq: Once | INTRAVENOUS | Status: AC
  Administered 2014-04-24: 160 mg via INTRAVENOUS
  Filled 2014-04-24: qty 32

## 2014-04-24 MED ORDER — LEUCOVORIN CALCIUM INJECTION 350 MG
400.0000 mg/m2 | Freq: Once | INTRAMUSCULAR | Status: AC
  Administered 2014-04-24: 744 mg via INTRAVENOUS
  Filled 2014-04-24: qty 37.2

## 2014-04-24 MED ORDER — SODIUM CHLORIDE 0.9 % IV SOLN
2400.0000 mg/m2 | INTRAVENOUS | Status: DC
  Administered 2014-04-24: 4450 mg via INTRAVENOUS
  Filled 2014-04-24: qty 89

## 2014-04-24 NOTE — Telephone Encounter (Signed)
Called and informed patient that potassium is better and to continue taking K-Dur 20 mEq daily.  Per Elby Showers. Marcello Moores, NP.  Patient verbalized understanding.

## 2014-04-24 NOTE — Telephone Encounter (Signed)
-----   Message from Owens Shark, NP sent at 04/24/2014 10:04 AM EDT ----- Please let her know potassium is better. She should continue K-Dur 20 mEq daily.

## 2014-04-24 NOTE — Patient Instructions (Signed)
[facility] Discharge Instructions for Patients Receiving Chemotherapy  Today you received the following chemotherapy agents FolFox  To help prevent nausea and vomiting after your treatment, we encourage you to take your nausea medication as directed/prescribed   If you develop nausea and vomiting that is not controlled by your nausea medication, call the clinic.   BELOW ARE SYMPTOMS THAT SHOULD BE REPORTED IMMEDIATELY:  *FEVER GREATER THAN 100.5 F  *CHILLS WITH OR WITHOUT FEVER  NAUSEA AND VOMITING THAT IS NOT CONTROLLED WITH YOUR NAUSEA MEDICATION  *UNUSUAL SHORTNESS OF BREATH  *UNUSUAL BRUISING OR BLEEDING  TENDERNESS IN MOUTH AND THROAT WITH OR WITHOUT PRESENCE OF ULCERS  *URINARY PROBLEMS  *BOWEL PROBLEMS  UNUSUAL RASH Items with * indicate a potential emergency and should be followed up as soon as possible.  Feel free to call the clinic you have any questions or concerns. The clinic phone number is [phone].  Please show the [place] at check-in to the Emergency Department and triage nurse.

## 2014-04-25 ENCOUNTER — Other Ambulatory Visit: Payer: Self-pay | Admitting: Oncology

## 2014-04-26 ENCOUNTER — Ambulatory Visit (HOSPITAL_BASED_OUTPATIENT_CLINIC_OR_DEPARTMENT_OTHER): Payer: Medicare Other

## 2014-04-26 ENCOUNTER — Ambulatory Visit: Payer: Medicare Other

## 2014-04-26 DIAGNOSIS — D701 Agranulocytosis secondary to cancer chemotherapy: Secondary | ICD-10-CM | POA: Diagnosis not present

## 2014-04-26 DIAGNOSIS — C189 Malignant neoplasm of colon, unspecified: Secondary | ICD-10-CM | POA: Diagnosis not present

## 2014-04-26 MED ORDER — PEGFILGRASTIM INJECTION 6 MG/0.6ML ~~LOC~~
6.0000 mg | PREFILLED_SYRINGE | Freq: Once | SUBCUTANEOUS | Status: AC
Start: 2014-04-26 — End: 2014-04-26
  Administered 2014-04-26: 6 mg via SUBCUTANEOUS

## 2014-04-26 MED ORDER — SODIUM CHLORIDE 0.9 % IJ SOLN
10.0000 mL | INTRAMUSCULAR | Status: DC | PRN
  Administered 2014-04-26: 10 mL
  Filled 2014-04-26: qty 10

## 2014-04-26 MED ORDER — HEPARIN SOD (PORK) LOCK FLUSH 100 UNIT/ML IV SOLN
500.0000 [IU] | Freq: Once | INTRAVENOUS | Status: AC | PRN
  Administered 2014-04-26: 500 [IU]
  Filled 2014-04-26: qty 5

## 2014-04-26 NOTE — Progress Notes (Signed)
Visit documented under Injection appt.

## 2014-05-01 ENCOUNTER — Ambulatory Visit: Payer: Medicare Other | Admitting: Oncology

## 2014-05-01 ENCOUNTER — Other Ambulatory Visit: Payer: Medicare Other

## 2014-05-01 ENCOUNTER — Ambulatory Visit: Payer: Medicare Other

## 2014-05-01 ENCOUNTER — Other Ambulatory Visit: Payer: Self-pay | Admitting: *Deleted

## 2014-05-01 MED ORDER — ONDANSETRON 8 MG PO TBDP: 8.0000 mg | ORAL_TABLET | Freq: Four times a day (QID) | ORAL | Status: DC | PRN

## 2014-05-04 ENCOUNTER — Other Ambulatory Visit: Payer: Self-pay | Admitting: Oncology

## 2014-05-08 ENCOUNTER — Ambulatory Visit (HOSPITAL_BASED_OUTPATIENT_CLINIC_OR_DEPARTMENT_OTHER): Payer: Medicare Other | Admitting: Nurse Practitioner

## 2014-05-08 ENCOUNTER — Ambulatory Visit (HOSPITAL_BASED_OUTPATIENT_CLINIC_OR_DEPARTMENT_OTHER): Payer: Medicare Other

## 2014-05-08 ENCOUNTER — Other Ambulatory Visit: Payer: Medicare Other

## 2014-05-08 ENCOUNTER — Telehealth: Payer: Self-pay | Admitting: Oncology

## 2014-05-08 ENCOUNTER — Other Ambulatory Visit (HOSPITAL_BASED_OUTPATIENT_CLINIC_OR_DEPARTMENT_OTHER): Payer: Medicare Other | Admitting: Lab

## 2014-05-08 ENCOUNTER — Ambulatory Visit: Payer: Medicare Other | Admitting: Genetic Counselor

## 2014-05-08 ENCOUNTER — Encounter: Payer: Self-pay | Admitting: Genetic Counselor

## 2014-05-08 VITALS — BP 149/95 | HR 75 | Temp 98.7°F | Resp 18 | Ht 62.0 in | Wt 169.9 lb

## 2014-05-08 DIAGNOSIS — D701 Agranulocytosis secondary to cancer chemotherapy: Secondary | ICD-10-CM

## 2014-05-08 DIAGNOSIS — E876 Hypokalemia: Secondary | ICD-10-CM

## 2014-05-08 DIAGNOSIS — D509 Iron deficiency anemia, unspecified: Secondary | ICD-10-CM

## 2014-05-08 DIAGNOSIS — Z803 Family history of malignant neoplasm of breast: Secondary | ICD-10-CM

## 2014-05-08 DIAGNOSIS — M25512 Pain in left shoulder: Secondary | ICD-10-CM

## 2014-05-08 DIAGNOSIS — C180 Malignant neoplasm of cecum: Secondary | ICD-10-CM | POA: Diagnosis not present

## 2014-05-08 DIAGNOSIS — Z5111 Encounter for antineoplastic chemotherapy: Secondary | ICD-10-CM

## 2014-05-08 DIAGNOSIS — C189 Malignant neoplasm of colon, unspecified: Secondary | ICD-10-CM

## 2014-05-08 DIAGNOSIS — M79622 Pain in left upper arm: Secondary | ICD-10-CM

## 2014-05-08 LAB — COMPREHENSIVE METABOLIC PANEL (CC13)
ALT: 31 U/L (ref 0–55)
AST: 40 U/L — ABNORMAL HIGH (ref 5–34)
Albumin: 3.6 g/dL (ref 3.5–5.0)
Alkaline Phosphatase: 147 U/L (ref 40–150)
Anion Gap: 12 mEq/L — ABNORMAL HIGH (ref 3–11)
BUN: 9.7 mg/dL (ref 7.0–26.0)
CO2: 25 mEq/L (ref 22–29)
Calcium: 9 mg/dL (ref 8.4–10.4)
Chloride: 108 mEq/L (ref 98–109)
Creatinine: 0.9 mg/dL (ref 0.6–1.1)
EGFR: 83 mL/min/{1.73_m2} — ABNORMAL LOW (ref >90)
Glucose: 120 mg/dl (ref 70–140)
Potassium: 3.4 mEq/L — ABNORMAL LOW (ref 3.5–5.1)
Sodium: 145 mEq/L (ref 136–145)
Total Bilirubin: 0.29 mg/dL (ref 0.20–1.20)
Total Protein: 6.6 g/dL (ref 6.4–8.3)

## 2014-05-08 LAB — CBC WITH DIFFERENTIAL/PLATELET
BASO%: 0.3 % (ref 0.0–2.0)
BASOS ABS: 0 10*3/uL (ref 0.0–0.1)
EOS%: 0 % (ref 0.0–7.0)
Eosinophils Absolute: 0 10*3/uL (ref 0.0–0.5)
HCT: 38.3 % (ref 34.8–46.6)
HEMOGLOBIN: 11.9 g/dL (ref 11.6–15.9)
LYMPH#: 0.9 10*3/uL (ref 0.9–3.3)
LYMPH%: 5.7 % — ABNORMAL LOW (ref 14.0–49.7)
MCH: 26.5 pg (ref 25.1–34.0)
MCHC: 31.1 g/dL — ABNORMAL LOW (ref 31.5–36.0)
MCV: 85.4 fL (ref 79.5–101.0)
MONO#: 0.4 10*3/uL (ref 0.1–0.9)
MONO%: 2.3 % (ref 0.0–14.0)
NEUT%: 91.7 % — ABNORMAL HIGH (ref 38.4–76.8)
NEUTROS ABS: 14 10*3/uL — AB (ref 1.5–6.5)
Platelets: 105 10*3/uL — ABNORMAL LOW (ref 145–400)
RBC: 4.49 10*6/uL (ref 3.70–5.45)
RDW: 24.7 % — AB (ref 11.2–14.5)
WBC: 15.2 10*3/uL — AB (ref 3.9–10.3)

## 2014-05-08 MED ORDER — SODIUM CHLORIDE 0.9 % IV SOLN
2400.0000 mg/m2 | INTRAVENOUS | Status: DC
  Administered 2014-05-08: 4450 mg via INTRAVENOUS
  Filled 2014-05-08: qty 89

## 2014-05-08 MED ORDER — PALONOSETRON HCL INJECTION 0.25 MG/5ML
0.2500 mg | Freq: Once | INTRAVENOUS | Status: AC
  Administered 2014-05-08: 0.25 mg via INTRAVENOUS

## 2014-05-08 MED ORDER — DEXTROSE 5 % IV SOLN
85.0000 mg/m2 | Freq: Once | INTRAVENOUS | Status: AC
  Administered 2014-05-08: 160 mg via INTRAVENOUS
  Filled 2014-05-08: qty 32

## 2014-05-08 MED ORDER — FLUOROURACIL CHEMO INJECTION 2.5 GM/50ML
400.0000 mg/m2 | Freq: Once | INTRAVENOUS | Status: AC
  Administered 2014-05-08: 750 mg via INTRAVENOUS
  Filled 2014-05-08: qty 15

## 2014-05-08 MED ORDER — SODIUM CHLORIDE 0.9 % IV SOLN
Freq: Once | INTRAVENOUS | Status: AC
  Administered 2014-05-08: 12:00:00 via INTRAVENOUS

## 2014-05-08 MED ORDER — SODIUM CHLORIDE 0.9 % IV SOLN
Freq: Once | INTRAVENOUS | Status: AC
  Administered 2014-05-08: 12:00:00 via INTRAVENOUS
  Filled 2014-05-08: qty 5

## 2014-05-08 MED ORDER — LEUCOVORIN CALCIUM INJECTION 350 MG
400.0000 mg/m2 | Freq: Once | INTRAVENOUS | Status: AC
  Administered 2014-05-08: 744 mg via INTRAVENOUS
  Filled 2014-05-08: qty 37.2

## 2014-05-08 MED ORDER — PALONOSETRON HCL INJECTION 0.25 MG/5ML
INTRAVENOUS | Status: AC
  Filled 2014-05-08: qty 5

## 2014-05-08 MED ORDER — HYDROCODONE-ACETAMINOPHEN 5-325 MG PO TABS: 1.0000 | ORAL_TABLET | Freq: Three times a day (TID) | ORAL | Status: DC | PRN

## 2014-05-08 MED ORDER — DEXTROSE 5 % IV SOLN
Freq: Once | INTRAVENOUS | Status: AC
  Administered 2014-05-08: 12:00:00 via INTRAVENOUS

## 2014-05-08 NOTE — Telephone Encounter (Signed)
Labs/ov added per 04/07 POF, sent msg to add chemo and to give pt updated schedule before she leaves, also sent referral to Medical Records per [facility] they need notes, labs, scans per last visit then they will contact pt with apt... KJ

## 2014-05-08 NOTE — Progress Notes (Signed)
Patient Name: Gail Carlson Patient Age: 64 y.o. Encounter Date: 05/08/2014  Referring Physician: [name], MD  Primary Care Provider: Philis Fendt, MD   Gail Carlson, a 63 y.o. female, is being seen at the [facility] due to a personal and family history of cancer. She presents to clinic today to discuss the possibility of a hereditary predisposition to cancer and discuss whether genetic testing is warranted.  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 The tumor was microsatellite stable (MSS). She is status post a right colectomy on 01/11/2014.   Past Medical History  Diagnosis Date  . Hypertension   . Anemia   . GERD (gastroesophageal reflux disease)     no meds  . Colon cancer   . Family history of breast cancer     Past Surgical History  Procedure Laterality Date  . Appendectomy    . Esophagogastroduodenoscopy N/A 01/10/2014    Procedure: ESOPHAGOGASTRODUODENOSCOPY (EGD);  Surgeon: Irene Shipper, MD;  Location: [facility];  Service: Endoscopy;  Laterality: N/A;  . Colonoscopy N/A 01/10/2014    Procedure: COLONOSCOPY;  Surgeon: Irene Shipper, MD;  Location: [facility];  Service: Endoscopy;  Laterality: N/A;  . Savory dilation N/A 01/10/2014    Procedure: SAVORY DILATION;  Surgeon: Irene Shipper, MD;  Location: [facility];  Service: Endoscopy;  Laterality: N/A;  Please verify with Henrene Pastor what type of dilation he will use  . Laparotomy N/A 01/11/2014    Procedure: EXPLORATORY LAPAROTOMY;  Surgeon: Armandina Gemma, MD;  Location: [facility] ORS;  Service: General;  Laterality: N/A;  . Partial colectomy N/A 01/11/2014    Procedure: PARTIAL COLECTOMY;  Surgeon: Armandina Gemma, MD;  Location: [facility];  Service: General;  Laterality: N/A;  . Portacath placement N/A 02/18/2014    Procedure: INSERTION  PORT-A-CATH LEFT SUBCLAVIAN VIEN;  Surgeon: Armandina Gemma, MD;  Location: [facility];  Service: General;  Laterality: N/A;    History   Social History  . Marital Status: Widowed    Spouse Name: N/A  . Number of Children: 2  . Years of Education: N/A   Occupational History  . Retired    Social History Main Topics  . Smoking status: Current Every Day Smoker -- 0.50 packs/day    Types: Cigarettes  . Smokeless tobacco: Never Used  . Alcohol Use: 3.0 oz/week    2 Cans of beer, 3 Shots of liquor, 0 Standard drinks or equivalent per week     Comment: occ  . Drug Use: No  . Sexual Activity: Not on file     Comment: stopped 12/15-occ has one   Other Topics Concern  . Not on file   Social History Narrative     FAMILY HISTORY:   During the visit, a 4-generation pedigree was obtained. Family tree will be sent for scanning and will be in EPIC under the Media tab.  Significant diagnoses include the following:  Family History  Problem Relation Age of Onset  . Breast cancer Mother     Dx 75s; deceased 21  . Diabetes Mother   . Diabetes Brother   . Uterine cancer Maternal Aunt     Dx 58s; deceased 62s  . Breast cancer Cousin     daughter of mat aunt w/ uterine ca  . Breast cancer Cousin     daughter of  mat aunt w/ uterine ca  . Breast cancer Cousin     daughter of mat aunt w/ uterine ca    Additionally, Gail Carlson has two daughters (age 13 and 62). She has a sister and 5 brothers who are all cancer-free. Her father died at 20 and he had a sister and 4 brothers. There are no cancers reported in any paternal relatives. In addition to her mother's sister with reported uterine cancer, her mother had 3 other sisters and 3 brothers with no history of cancer.  Gail Carlson ancestry is African American. There is no known Jewish ancestry and no consanguinity.  ASSESSMENT AND PLAN: [name] is a 64 y.o. female with a personal history of colon cancer and strong family  history of breast cancer. Given her age at diagnosis and MSS tumor, it is very likely that her colon cancer is not related to a hereditary predisposition. We discussed that her family history of breast cancer is suggestive of a hereditary predisposition to cancer. Unfortunately, Gail Carlson does not meet criteria for her insurance [employer] to cover testing costs. We proceeded with a benefits verification today through [employer] to make sure this is the case. Cephus Shelling confirmed that she does not meet criteria since she herself has not had breast or ovarian cancer. She does not meet criteria for Lynch testing given that she has only one second-degree relatives with a Lynch-associated cancer.   Gail Carlson is encouraged to speak with her cousins who had breast cancer. They are recommended to undergo a genetics evaluation. At this time, we will defer her screenings to her overseeing providers.  We encouraged Gail Carlson to remain in contact with Cancer Genetics annually so that we can update the family history and inform her of any changes in cancer genetics and testing that may be of benefit for this family. Ms.  Carlson questions were answered to her satisfaction today.   Thank you for the referral and allowing [name] to share in the care of your patient.   The patient was seen for a total of 30 minutes, greater than 50% of which was spent face-to-face counseling. This patient was discussed with the overseeing provider who agrees with the above.   Steele Berg, MS, [place] Certified Genetic Counselor phone: [phone] [email or web address]

## 2014-05-08 NOTE — Patient Instructions (Signed)
[place] Discharge Instructions for Patients Receiving Chemotherapy  Today you received the following chemotherapy agents Leucovorin/Oxaliplatin/5 FU To help prevent nausea and vomiting after your treatment, we encourage you to take your nausea medication as prescribed.   If you develop nausea and vomiting that is not controlled by your nausea medication, call the clinic.   BELOW ARE SYMPTOMS THAT SHOULD BE REPORTED IMMEDIATELY:  *FEVER GREATER THAN 100.5 F  *CHILLS WITH OR WITHOUT FEVER  NAUSEA AND VOMITING THAT IS NOT CONTROLLED WITH YOUR NAUSEA MEDICATION  *UNUSUAL SHORTNESS OF BREATH  *UNUSUAL BRUISING OR BLEEDING  TENDERNESS IN MOUTH AND THROAT WITH OR WITHOUT PRESENCE OF ULCERS  *URINARY PROBLEMS  *BOWEL PROBLEMS  UNUSUAL RASH Items with * indicate a potential emergency and should be followed up as soon as possible.  Feel free to call the clinic you have any questions or concerns. The clinic phone number is [phone].  Please show the [place] at check-in to the Emergency Department and triage nurse.

## 2014-05-08 NOTE — Progress Notes (Addendum)
  [place] OFFICE PROGRESS NOTE   Diagnosis:  Colon cancer  INTERVAL HISTORY:   Ms. Souders returns as scheduled. She completed cycle 5 FOLFOX on 04/24/2014. She received Neulasta support. She denies nausea/vomiting. No mouth sores. No diarrhea. Cold sensitivity lasted 3 days. No persistent neuropathy symptoms. She noted some achy arm and leg discomfort for 3 days following the Neulasta injection. She continues to have intermittent left shoulder pain and notes decreased range of motion. Ultram has not been effective. Hydrocodone was effective in the past.  Objective:  Vital signs in last 24 hours:  Blood pressure 149/95, pulse 75, temperature 98.7 F (37.1 C), temperature source Oral, resp. rate 18, height _0  (1.575 m), weight 169 lb 14.4 oz (77.066 kg), SpO2 100 %. repeat blood pressure 149/95 heart rate 75    HEENT: No thrush or ulcers. Resp: Lungs clear bilaterally. Cardio: Regular rate and rhythm. GI: Abdomen soft and nontender. No hepatomegaly. Vascular: No leg or arm edema. Neuro: Vibratory sense intact over the fingertips per tuning fork exam.  Musculoskeletal: Decreased range of motion at the left shoulder which appears to be due to pain. No palpable abnormality. Port-A-Cath without erythema.    Lab Results:  Lab Results  Component Value Date   WBC 15.2* 05/08/2014   HGB 11.9 05/08/2014   HCT 38.3 05/08/2014   MCV 85.4 05/08/2014   PLT 105* 05/08/2014   NEUTROABS 14.0* 05/08/2014    Imaging:  No results found.  Medications: I have reviewed the patient's current medications.  Assessment/Plan: 1. 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  CTs of the chest, abdomen, and pelvis on 01/10/2014 with a cecal mass, nonspecific small liver lesions, and nonspecific lung nodules felt to  most likely represent mucoid impaction  Cycle 1 FOLFOX 02/20/2014  Cycle 2 FOLFOX 03/06/2014  Cycle 3 FOLFOX 03/20/2014  Cycle 4 FOLFOX 04/03/2014  Cycle 5 FOLFOX held on 04/17/2014 due to neutropenia  Cycle 5 FOLFOX 04/24/2014 with Neulasta support  2. Iron deficiency anemia 3. Family history of breast cancer 4. Port-A-Cath placement 02/18/2014 5. Delayed nausea following cycle 1 FOLFOX. Aloxi and Emend added beginning with cycle 2. Prophylactic Decadron added beginning with cycle 3. 6. Neutropenia following cycle 4 FOLFOX. Neulasta will be added with cycle 5. 7. Hypokalemia 04/17/2014. Question secondary to diarrhea. Potassium supplement initiated. 8. Left upper arm pain. Negative venous Doppler 03/07/2014.   Disposition: Ms. Haft appears stable. She has completed 5 cycles of FOLFOX. Plan to proceed with cycle 6 today as scheduled. We will hold Neulasta on the day of pump discontinuation due to the elevated white count/neutrophil count.  She continues to have left shoulder pain. We made a referral to orthopedics. She was given a prescription for hydrocodone 5/325 one tablet every hours as needed for pain.  She will return for a follow-up visit and cycle 7 FOLFOX in 2 weeks. She will contact the office in the interim with any problems.  Ned Card ANP/GNP-BC   05/08/2014  11:35 AM

## 2014-05-09 ENCOUNTER — Telehealth: Payer: Self-pay | Admitting: *Deleted

## 2014-05-09 NOTE — Telephone Encounter (Signed)
Per staff message and POF I have scheduled appts. Advised scheduler of appts. JMW  

## 2014-05-10 ENCOUNTER — Ambulatory Visit (HOSPITAL_BASED_OUTPATIENT_CLINIC_OR_DEPARTMENT_OTHER): Payer: Medicare Other

## 2014-05-10 DIAGNOSIS — C180 Malignant neoplasm of cecum: Secondary | ICD-10-CM

## 2014-05-10 DIAGNOSIS — Z452 Encounter for adjustment and management of vascular access device: Secondary | ICD-10-CM | POA: Diagnosis not present

## 2014-05-10 MED ORDER — HEPARIN SOD (PORK) LOCK FLUSH 100 UNIT/ML IV SOLN
500.0000 [IU] | Freq: Once | INTRAVENOUS | Status: AC | PRN
  Administered 2014-05-10: 500 [IU]
  Filled 2014-05-10: qty 5

## 2014-05-10 MED ORDER — SODIUM CHLORIDE 0.9 % IJ SOLN
10.0000 mL | INTRAMUSCULAR | Status: DC | PRN
  Administered 2014-05-10: 10 mL
  Filled 2014-05-10: qty 10

## 2014-05-14 ENCOUNTER — Telehealth: Payer: Self-pay | Admitting: Oncology

## 2014-05-14 NOTE — Telephone Encounter (Signed)
FAXED PT [place]

## 2014-05-15 ENCOUNTER — Other Ambulatory Visit: Payer: Medicare Other

## 2014-05-15 ENCOUNTER — Ambulatory Visit: Payer: Medicare Other | Admitting: Nurse Practitioner

## 2014-05-15 ENCOUNTER — Ambulatory Visit: Payer: Medicare Other

## 2014-05-18 ENCOUNTER — Other Ambulatory Visit: Payer: Self-pay | Admitting: Oncology

## 2014-05-22 ENCOUNTER — Ambulatory Visit (HOSPITAL_BASED_OUTPATIENT_CLINIC_OR_DEPARTMENT_OTHER): Payer: Medicare Other | Admitting: Nurse Practitioner

## 2014-05-22 ENCOUNTER — Ambulatory Visit (HOSPITAL_BASED_OUTPATIENT_CLINIC_OR_DEPARTMENT_OTHER): Payer: Medicare Other

## 2014-05-22 ENCOUNTER — Encounter: Payer: Self-pay | Admitting: *Deleted

## 2014-05-22 ENCOUNTER — Telehealth: Payer: Self-pay | Admitting: Oncology

## 2014-05-22 ENCOUNTER — Other Ambulatory Visit (HOSPITAL_BASED_OUTPATIENT_CLINIC_OR_DEPARTMENT_OTHER): Payer: Medicare Other

## 2014-05-22 VITALS — BP 161/84 | HR 68 | Temp 98.7°F | Resp 18 | Ht 62.0 in | Wt 174.1 lb

## 2014-05-22 DIAGNOSIS — C189 Malignant neoplasm of colon, unspecified: Secondary | ICD-10-CM

## 2014-05-22 DIAGNOSIS — D509 Iron deficiency anemia, unspecified: Secondary | ICD-10-CM

## 2014-05-22 DIAGNOSIS — E876 Hypokalemia: Secondary | ICD-10-CM

## 2014-05-22 DIAGNOSIS — D701 Agranulocytosis secondary to cancer chemotherapy: Secondary | ICD-10-CM

## 2014-05-22 DIAGNOSIS — C180 Malignant neoplasm of cecum: Secondary | ICD-10-CM

## 2014-05-22 DIAGNOSIS — Z5111 Encounter for antineoplastic chemotherapy: Secondary | ICD-10-CM

## 2014-05-22 LAB — CBC WITH DIFFERENTIAL/PLATELET
BASO%: 0.8 % (ref 0.0–2.0)
BASOS ABS: 0 10*3/uL (ref 0.0–0.1)
EOS%: 0.4 % (ref 0.0–7.0)
Eosinophils Absolute: 0 10*3/uL (ref 0.0–0.5)
HCT: 36.2 % (ref 34.8–46.6)
HEMOGLOBIN: 11.5 g/dL — AB (ref 11.6–15.9)
LYMPH#: 0.6 10*3/uL — AB (ref 0.9–3.3)
LYMPH%: 24.6 % (ref 14.0–49.7)
MCH: 28.2 pg (ref 25.1–34.0)
MCHC: 31.8 g/dL (ref 31.5–36.0)
MCV: 88.7 fL (ref 79.5–101.0)
MONO#: 0.4 10*3/uL (ref 0.1–0.9)
MONO%: 17.1 % — ABNORMAL HIGH (ref 0.0–14.0)
NEUT#: 1.4 10*3/uL — ABNORMAL LOW (ref 1.5–6.5)
NEUT%: 57.1 % (ref 38.4–76.8)
Platelets: 110 10*3/uL — ABNORMAL LOW (ref 145–400)
RBC: 4.08 10*6/uL (ref 3.70–5.45)
RDW: 20.5 % — AB (ref 11.2–14.5)
WBC: 2.5 10*3/uL — ABNORMAL LOW (ref 3.9–10.3)
nRBC: 0 % (ref 0–0)

## 2014-05-22 LAB — COMPREHENSIVE METABOLIC PANEL (CC13)
ALK PHOS: 89 U/L (ref 40–150)
ALT: 26 U/L (ref 0–55)
AST: 30 U/L (ref 5–34)
Albumin: 3.4 g/dL — ABNORMAL LOW (ref 3.5–5.0)
Anion Gap: 12 mEq/L — ABNORMAL HIGH (ref 3–11)
BUN: 12.1 mg/dL (ref 7.0–26.0)
CO2: 24 meq/L (ref 22–29)
Calcium: 8.9 mg/dL (ref 8.4–10.4)
Chloride: 109 mEq/L (ref 98–109)
Creatinine: 0.8 mg/dL (ref 0.6–1.1)
EGFR: 84 mL/min/{1.73_m2} — ABNORMAL LOW (ref >90)
Glucose: 94 mg/dl (ref 70–140)
Potassium: 3.3 mEq/L — ABNORMAL LOW (ref 3.5–5.1)
Sodium: 145 mEq/L (ref 136–145)
Total Protein: 6.3 g/dL — ABNORMAL LOW (ref 6.4–8.3)

## 2014-05-22 MED ORDER — SODIUM CHLORIDE 0.9 % IV SOLN
2400.0000 mg/m2 | INTRAVENOUS | Status: DC
  Administered 2014-05-22: 4450 mg via INTRAVENOUS
  Filled 2014-05-22: qty 89

## 2014-05-22 MED ORDER — HEPARIN SOD (PORK) LOCK FLUSH 100 UNIT/ML IV SOLN
500.0000 [IU] | Freq: Once | INTRAVENOUS | Status: DC | PRN
  Filled 2014-05-22: qty 5

## 2014-05-22 MED ORDER — LORAZEPAM 0.5 MG PO TABS: 0.5000 mg | ORAL_TABLET | Freq: Three times a day (TID) | ORAL | Status: DC | PRN

## 2014-05-22 MED ORDER — FLUOROURACIL CHEMO INJECTION 2.5 GM/50ML
400.0000 mg/m2 | Freq: Once | INTRAVENOUS | Status: AC
  Administered 2014-05-22: 750 mg via INTRAVENOUS
  Filled 2014-05-22: qty 15

## 2014-05-22 MED ORDER — LEUCOVORIN CALCIUM INJECTION 350 MG
400.0000 mg/m2 | Freq: Once | INTRAMUSCULAR | Status: AC
  Administered 2014-05-22: 744 mg via INTRAVENOUS
  Filled 2014-05-22: qty 37.2

## 2014-05-22 MED ORDER — PALONOSETRON HCL INJECTION 0.25 MG/5ML
INTRAVENOUS | Status: AC
  Filled 2014-05-22: qty 5

## 2014-05-22 MED ORDER — FOSAPREPITANT DIMEGLUMINE INJECTION 150 MG
Freq: Once | INTRAVENOUS | Status: AC
  Administered 2014-05-22: 12:00:00 via INTRAVENOUS
  Filled 2014-05-22: qty 5

## 2014-05-22 MED ORDER — DEXTROSE 5 % IV SOLN
Freq: Once | INTRAVENOUS | Status: AC
  Administered 2014-05-22: 13:00:00 via INTRAVENOUS

## 2014-05-22 MED ORDER — PALONOSETRON HCL INJECTION 0.25 MG/5ML
0.2500 mg | Freq: Once | INTRAVENOUS | Status: AC
  Administered 2014-05-22: 0.25 mg via INTRAVENOUS

## 2014-05-22 MED ORDER — ONDANSETRON 8 MG PO TBDP: 8.0000 mg | ORAL_TABLET | Freq: Three times a day (TID) | ORAL | Status: DC | PRN

## 2014-05-22 MED ORDER — OXALIPLATIN CHEMO INJECTION 100 MG/20ML
85.0000 mg/m2 | Freq: Once | INTRAVENOUS | Status: AC
  Administered 2014-05-22: 160 mg via INTRAVENOUS
  Filled 2014-05-22: qty 32

## 2014-05-22 MED ORDER — SODIUM CHLORIDE 0.9 % IJ SOLN
10.0000 mL | INTRAMUSCULAR | Status: DC | PRN
  Filled 2014-05-22: qty 10

## 2014-05-22 NOTE — [facility] Oncology Navigator Note (Signed)
Met with patient during chemotherapy treatment to provide support and assess for needs to promote continuity of care. She reports the only issue she has now is finding transportation to the cancer center since her daughter has a new job. Provided her with packet on [facility] to call and register for transportation. Made her aware they require 4-5 business day notice. Suggested she call today to arrange for her next appointment.  Merceda Elks, RN, BSN GI Oncology [place]

## 2014-05-22 NOTE — Telephone Encounter (Signed)
gave adn prnted appt sched and avs for pt for April and May....sed added tx....lvm gor [place] ortho to call back

## 2014-05-22 NOTE — Progress Notes (Signed)
Per Dr.Sherrill/Tanya RN proceed with treatment. MD aware of abnormal lab values. Pt to receive Neulasta and was instructed on the importance of compliance with oral potassium per [name], NP

## 2014-05-22 NOTE — Patient Instructions (Signed)
[facility] Discharge Instructions for Patients Receiving Chemotherapy  Today you received the following chemotherapy agents 5FU, Oxaliplatin and Leucovorin.  To help prevent nausea and vomiting after your treatment, we encourage you to take your nausea medication as prescribed.   If you develop nausea and vomiting that is not controlled by your nausea medication, call the clinic.   BELOW ARE SYMPTOMS THAT SHOULD BE REPORTED IMMEDIATELY:  *FEVER GREATER THAN 100.5 F  *CHILLS WITH OR WITHOUT FEVER  NAUSEA AND VOMITING THAT IS NOT CONTROLLED WITH YOUR NAUSEA MEDICATION  *UNUSUAL SHORTNESS OF BREATH  *UNUSUAL BRUISING OR BLEEDING  TENDERNESS IN MOUTH AND THROAT WITH OR WITHOUT PRESENCE OF ULCERS  *URINARY PROBLEMS  *BOWEL PROBLEMS  UNUSUAL RASH Items with * indicate a potential emergency and should be followed up as soon as possible.  Feel free to call the clinic you have any questions or concerns. The clinic phone number is [phone].  Please show the [place] at check-in to the Emergency Department and triage nurse.

## 2014-05-22 NOTE — Telephone Encounter (Signed)
Pt sched on 5.5 @ 10:30am with Dr. Emiliano Dyer....lvm for pt regarding to [place] ortho appt.

## 2014-05-22 NOTE — Progress Notes (Signed)
  [facility] OFFICE PROGRESS NOTE   Diagnosis:  Colon cancer  INTERVAL HISTORY:   Gail Carlson returns as scheduled. She completed cycle 6 FOLFOX on 05/08/2014. She denies nausea/vomiting. No mouth sores. No significant diarrhea. Cold sensitivity lasted 3 days. No persistent neuropathy symptoms. She notes an alteration in taste. She continues to have left arm/shoulder pain.  Objective:  Vital signs in last 24 hours:  Blood pressure 161/84, pulse 68, temperature 98.7 F (37.1 C), temperature source Oral, resp. rate 18, height _0  (1.575 m), weight 174 lb 1.6 oz (78.971 kg).    HEENT: No thrush or ulcers. Resp: Lungs clear bilaterally. Cardio: Regular rate and rhythm. GI: Abdomen soft and nontender. No hepatomegaly. Vascular: No leg edema. Calves soft and nontender. Neuro: Vibratory sense intact over the fingertips per tuning fork exam.  Skin: No rash. Port-A-Cath without erythema.    Lab Results:  Lab Results  Component Value Date   WBC 2.5* 05/22/2014   HGB 11.5* 05/22/2014   HCT 36.2 05/22/2014   MCV 88.7 05/22/2014   PLT 110* 05/22/2014   NEUTROABS 1.4* 05/22/2014    Imaging:  No results found.  Medications: I have reviewed the patient's current medications.  Assessment/Plan: 1. 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  CTs of the chest, abdomen, and pelvis on 01/10/2014 with a cecal mass, nonspecific small liver lesions, and nonspecific lung nodules felt to most likely represent mucoid impaction  Cycle 1 FOLFOX 02/20/2014  Cycle 2 FOLFOX 03/06/2014  Cycle 3 FOLFOX 03/20/2014  Cycle 4 FOLFOX 04/03/2014  Cycle 5 FOLFOX held on 04/17/2014 due to neutropenia  Cycle 5 FOLFOX 04/24/2014 with Neulasta support  Cycle 6 FOLFOX 05/08/2014  Cycle 7 FOLFOX 05/22/2014 with Neulasta  support  2. Iron deficiency anemia 3. Family history of breast cancer 4. Port-A-Cath placement 02/18/2014 5. Delayed nausea following cycle 1 FOLFOX. Aloxi and Emend added beginning with cycle 2. Prophylactic Decadron added beginning with cycle 3. 6. Neutropenia following cycle 4 FOLFOX. Neulasta will be added with cycle 5. 7. Hypokalemia 04/17/2014. Question secondary to diarrhea. Potassium supplement initiated. 8. Left upper arm pain. Negative venous Doppler 03/07/2014. She has been referred to orthopedics.   Disposition: Gail Carlson appears stable. She has completed 6 cycles of FOLFOX. Plan to proceed with cycle 7 today as scheduled. She will receive Neulasta on the day of pump discontinuation.  She will return for a follow-up visit and cycle 8 in 2 weeks. She will contact the office in the interim with any problems.    Ned Card ANP/GNP-BC   05/22/2014  10:17 AM

## 2014-05-24 ENCOUNTER — Ambulatory Visit: Payer: Medicare Other

## 2014-05-24 ENCOUNTER — Ambulatory Visit (HOSPITAL_BASED_OUTPATIENT_CLINIC_OR_DEPARTMENT_OTHER): Payer: Medicare Other

## 2014-05-24 VITALS — BP 170/84 | HR 68 | Temp 99.1°F | Resp 18

## 2014-05-24 DIAGNOSIS — C189 Malignant neoplasm of colon, unspecified: Secondary | ICD-10-CM

## 2014-05-24 DIAGNOSIS — D701 Agranulocytosis secondary to cancer chemotherapy: Secondary | ICD-10-CM | POA: Diagnosis not present

## 2014-05-24 DIAGNOSIS — C180 Malignant neoplasm of cecum: Secondary | ICD-10-CM | POA: Diagnosis not present

## 2014-05-24 MED ORDER — PEGFILGRASTIM INJECTION 6 MG/0.6ML ~~LOC~~
6.0000 mg | PREFILLED_SYRINGE | Freq: Once | SUBCUTANEOUS | Status: AC
  Administered 2014-05-24: 6 mg via SUBCUTANEOUS

## 2014-05-24 MED ORDER — HEPARIN SOD (PORK) LOCK FLUSH 100 UNIT/ML IV SOLN
500.0000 [IU] | Freq: Once | INTRAVENOUS | Status: AC | PRN
  Administered 2014-05-24: 500 [IU]
  Filled 2014-05-24: qty 5

## 2014-05-24 MED ORDER — SODIUM CHLORIDE 0.9 % IJ SOLN
10.0000 mL | INTRAMUSCULAR | Status: DC | PRN
  Administered 2014-05-24: 10 mL
  Filled 2014-05-24: qty 10

## 2014-05-24 MED ORDER — HEPARIN SOD (PORK) LOCK FLUSH 100 UNIT/ML IV SOLN
250.0000 [IU] | Freq: Once | INTRAVENOUS | Status: DC | PRN
  Filled 2014-05-24: qty 5

## 2014-05-24 NOTE — Progress Notes (Signed)
Charted on injection appt

## 2014-05-24 NOTE — Progress Notes (Signed)
12.5 mL remaining to be infused.  6 mL bolused.  6.5 mL wasted.

## 2014-05-26 ENCOUNTER — Telehealth: Payer: Self-pay | Admitting: Oncology

## 2014-05-26 NOTE — Telephone Encounter (Signed)
Faxed pt medical records to [facility]

## 2014-05-31 ENCOUNTER — Ambulatory Visit: Payer: Medicare Other

## 2014-06-01 ENCOUNTER — Other Ambulatory Visit: Payer: Self-pay | Admitting: Oncology

## 2014-06-05 ENCOUNTER — Telehealth: Payer: Self-pay | Admitting: Oncology

## 2014-06-05 ENCOUNTER — Ambulatory Visit (HOSPITAL_BASED_OUTPATIENT_CLINIC_OR_DEPARTMENT_OTHER): Payer: Medicare Other

## 2014-06-05 ENCOUNTER — Ambulatory Visit (HOSPITAL_BASED_OUTPATIENT_CLINIC_OR_DEPARTMENT_OTHER): Payer: Medicare Other | Admitting: Oncology

## 2014-06-05 ENCOUNTER — Other Ambulatory Visit (HOSPITAL_BASED_OUTPATIENT_CLINIC_OR_DEPARTMENT_OTHER): Payer: Medicare Other

## 2014-06-05 ENCOUNTER — Other Ambulatory Visit: Payer: Self-pay | Admitting: *Deleted

## 2014-06-05 ENCOUNTER — Telehealth: Payer: Self-pay | Admitting: *Deleted

## 2014-06-05 VITALS — BP 155/95 | HR 80 | Temp 98.3°F | Resp 19 | Ht 62.0 in | Wt 172.1 lb

## 2014-06-05 DIAGNOSIS — D701 Agranulocytosis secondary to cancer chemotherapy: Secondary | ICD-10-CM | POA: Diagnosis not present

## 2014-06-05 DIAGNOSIS — Z95828 Presence of other vascular implants and grafts: Secondary | ICD-10-CM

## 2014-06-05 DIAGNOSIS — D509 Iron deficiency anemia, unspecified: Secondary | ICD-10-CM

## 2014-06-05 DIAGNOSIS — C180 Malignant neoplasm of cecum: Secondary | ICD-10-CM

## 2014-06-05 DIAGNOSIS — C189 Malignant neoplasm of colon, unspecified: Secondary | ICD-10-CM

## 2014-06-05 DIAGNOSIS — E876 Hypokalemia: Secondary | ICD-10-CM

## 2014-06-05 DIAGNOSIS — Z5111 Encounter for antineoplastic chemotherapy: Secondary | ICD-10-CM

## 2014-06-05 LAB — COMPREHENSIVE METABOLIC PANEL (CC13)
ALBUMIN: 3.6 g/dL (ref 3.5–5.0)
ALK PHOS: 147 U/L (ref 40–150)
ALT: 27 U/L (ref 0–55)
AST: 30 U/L (ref 5–34)
Anion Gap: 12 mEq/L — ABNORMAL HIGH (ref 3–11)
BUN: 9.4 mg/dL (ref 7.0–26.0)
CALCIUM: 8.8 mg/dL (ref 8.4–10.4)
CHLORIDE: 109 meq/L (ref 98–109)
CO2: 26 mEq/L (ref 22–29)
Creatinine: 1.1 mg/dL (ref 0.6–1.1)
EGFR: 64 mL/min/{1.73_m2} — ABNORMAL LOW (ref >90)
Glucose: 103 mg/dl (ref 70–140)
POTASSIUM: 3.1 meq/L — AB (ref 3.5–5.1)
Sodium: 147 mEq/L — ABNORMAL HIGH (ref 136–145)
Total Bilirubin: 0.28 mg/dL (ref 0.20–1.20)
Total Protein: 6.6 g/dL (ref 6.4–8.3)

## 2014-06-05 LAB — CBC WITH DIFFERENTIAL/PLATELET
BASO%: 0.3 % (ref 0.0–2.0)
BASOS ABS: 0 10*3/uL (ref 0.0–0.1)
EOS ABS: 0 10*3/uL (ref 0.0–0.5)
EOS%: 0.2 % (ref 0.0–7.0)
HCT: 40.6 % (ref 34.8–46.6)
HEMOGLOBIN: 12.8 g/dL (ref 11.6–15.9)
LYMPH%: 13.7 % — AB (ref 14.0–49.7)
MCH: 29.5 pg (ref 25.1–34.0)
MCHC: 31.5 g/dL (ref 31.5–36.0)
MCV: 93.5 fL (ref 79.5–101.0)
MONO#: 0.9 10*3/uL (ref 0.1–0.9)
MONO%: 6.8 % (ref 0.0–14.0)
NEUT%: 79 % — ABNORMAL HIGH (ref 38.4–76.8)
NEUTROS ABS: 9.9 10*3/uL — AB (ref 1.5–6.5)
PLATELETS: 66 10*3/uL — AB (ref 145–400)
RBC: 4.34 10*6/uL (ref 3.70–5.45)
RDW: 19.8 % — ABNORMAL HIGH (ref 11.2–14.5)
WBC: 12.5 10*3/uL — AB (ref 3.9–10.3)
lymph#: 1.7 10*3/uL (ref 0.9–3.3)

## 2014-06-05 MED ORDER — POTASSIUM CHLORIDE 20 MEQ/15ML (10%) PO SOLN: 20.0000 meq | Freq: Every day | ORAL | Status: DC

## 2014-06-05 MED ORDER — FLUOROURACIL CHEMO INJECTION 2.5 GM/50ML
400.0000 mg/m2 | Freq: Once | INTRAVENOUS | Status: AC
  Administered 2014-06-05: 750 mg via INTRAVENOUS
  Filled 2014-06-05: qty 15

## 2014-06-05 MED ORDER — LEUCOVORIN CALCIUM INJECTION 350 MG
400.0000 mg/m2 | Freq: Once | INTRAVENOUS | Status: AC
  Administered 2014-06-05: 744 mg via INTRAVENOUS
  Filled 2014-06-05: qty 37.2

## 2014-06-05 MED ORDER — SODIUM CHLORIDE 0.9 % IV SOLN
INTRAVENOUS | Status: DC
  Administered 2014-06-05: 10:00:00 via INTRAVENOUS

## 2014-06-05 MED ORDER — SODIUM CHLORIDE 0.9 % IV SOLN
2400.0000 mg/m2 | INTRAVENOUS | Status: DC
  Administered 2014-06-05: 4450 mg via INTRAVENOUS
  Filled 2014-06-05: qty 89

## 2014-06-05 NOTE — Patient Instructions (Signed)
[facility] Discharge Instructions for Patients Receiving Chemotherapy  Today you received the following chemotherapy agents 85fu and leucovorin To help prevent nausea and vomiting after your treatment, we encourage you to take your nausea medication as prescribed.  If you develop nausea and vomiting that is not controlled by your nausea medication, call the clinic.   BELOW ARE SYMPTOMS THAT SHOULD BE REPORTED IMMEDIATELY:  *FEVER GREATER THAN 100.5 F  *CHILLS WITH OR WITHOUT FEVER  NAUSEA AND VOMITING THAT IS NOT CONTROLLED WITH YOUR NAUSEA MEDICATION  *UNUSUAL SHORTNESS OF BREATH  *UNUSUAL BRUISING OR BLEEDING  TENDERNESS IN MOUTH AND THROAT WITH OR WITHOUT PRESENCE OF ULCERS  *URINARY PROBLEMS  *BOWEL PROBLEMS  UNUSUAL RASH Items with * indicate a potential emergency and should be followed up as soon as possible.  Feel free to call the clinic you have any questions or concerns. The clinic phone number is [phone].  Please show the [facility] [place] at check-in to the Emergency Department and triage nurse.

## 2014-06-05 NOTE — Progress Notes (Signed)
  [facility] OFFICE PROGRESS NOTE   Diagnosis: Colon cancer  INTERVAL HISTORY:   [name] returns as scheduled. She completed another cycle of FOLFOX beginning 05/22/2014. She reports nausea on day 2. She also developed a sore mouth on day 2 without discrete ulcers. She had one day of diarrhea. Cold sensitivity lasted for 3 days following chemotherapy. She reports numbness in the left fingers.   Objective:  Vital signs in last 24 hours:  Blood pressure 155/95, pulse 80, temperature 98.3 F (36.8 C), temperature source Oral, resp. rate 19, height $RemoveBe'5\' 2"'KChkLfIAR$  (1.575 m), weight 172 lb 1.6 oz (78.064 kg), SpO2 100 %.    HEENT: Hyperpigmentation of the buccal mucosa, no thrush or ulcers Resp: Lungs clear bilaterally Cardio: Regular rate and rhythm GI: No hepatomegaly, nontender Vascular: No leg edema     Portacath/PICC-without erythema  Lab Results:  Lab Results  Component Value Date   WBC 12.5* 06/05/2014   HGB 12.8 06/05/2014   HCT 40.6 06/05/2014   MCV 93.5 06/05/2014   PLT 66* 06/05/2014   NEUTROABS 9.9* 06/05/2014    Medications: I have reviewed the patient's current medications.  Assessment/Plan: 1. 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  CTs of the chest, abdomen, and pelvis on 01/10/2014 with a cecal mass, nonspecific small liver lesions, and nonspecific lung nodules felt to most likely represent mucoid impaction  Cycle 1 FOLFOX 02/20/2014  Cycle 2 FOLFOX 03/06/2014  Cycle 3 FOLFOX 03/20/2014  Cycle 4 FOLFOX 04/03/2014  Cycle 5 FOLFOX held on 04/17/2014 due to neutropenia  Cycle 5 FOLFOX 04/24/2014 with Neulasta support  Cycle 6 FOLFOX 05/08/2014  Cycle 7 FOLFOX 05/22/2014 with Neulasta support  Cycle 8 FOLFOX 06/05/2014 (oxaliplatin held secondary to thrombocytopenia)    2. Iron deficiency anemia 3. Family history of breast cancer 4. Port-A-Cath placement 02/18/2014 5. Delayed nausea following cycle 1 FOLFOX. Aloxi and Emend added beginning with cycle 2. Prophylactic Decadron added beginning with cycle 3. 6. Neutropenia following cycle 4 FOLFOX. Neulasta will be added with cycle 5. 7. Hypokalemia 04/17/2014. Question secondary to diarrhea. Potassium supplement initiated. 8. Left upper arm pain. Negative venous Doppler 03/07/2014. She has been referred to orthopedics.    Disposition:  [name] appears stable. She will complete another cycle of FOLFOX today. Oxaliplatin will be held secondary to thrombocytopenia. She will return for an office visit and chemotherapy in 2 weeks. The mouth soreness on day 2 following the last cycle of chemotherapy is not typical of 5-FU induced mucositis. We decided to proceed with the same 5-FU dose today. She will follow-up with her primary physician for management of hypertension.  [name], MD  06/05/2014  9:39 AM

## 2014-06-05 NOTE — Progress Notes (Signed)
Per Dr. Benay Spice: OK to treat with Leucovorin and 5FU with PLT 66k.

## 2014-06-05 NOTE — Telephone Encounter (Signed)
per pof to sch pt appt-gave pt copy of sch-sent MW email to sch pt trmt-pt aware °

## 2014-06-05 NOTE — Telephone Encounter (Signed)
Resume kcl pills, 34meq bid

## 2014-06-05 NOTE — Telephone Encounter (Signed)
THE POTASSIUM LIQUID IS $150.00. PT. IS UNABLE TO AFFORD THIS MEDICATION. PT. SAID SHE HAS SOME POTASSIUM PILLS FROM [facility]. DOES PT. TAKE THE POTASSIUM PILLS FROM [facility]?

## 2014-06-06 ENCOUNTER — Other Ambulatory Visit: Payer: Self-pay | Admitting: Medical Oncology

## 2014-06-06 MED ORDER — POTASSIUM CHLORIDE CRYS ER 20 MEQ PO TBCR: EXTENDED_RELEASE_TABLET | ORAL | Status: DC

## 2014-06-06 NOTE — Telephone Encounter (Signed)
VERBAL ORDER AND READ BACK TO DR.[place] E-SCRIBED TO PHARMACY.

## 2014-06-06 NOTE — Addendum Note (Signed)
Addended by: Wyonia Hough on: 06/06/2014 10:37 AM   Modules accepted: Orders

## 2014-06-07 ENCOUNTER — Ambulatory Visit (HOSPITAL_BASED_OUTPATIENT_CLINIC_OR_DEPARTMENT_OTHER): Payer: Medicare Other

## 2014-06-07 ENCOUNTER — Ambulatory Visit: Payer: Medicare Other

## 2014-06-07 VITALS — BP 149/101 | HR 100 | Temp 98.7°F | Resp 20

## 2014-06-07 DIAGNOSIS — Z452 Encounter for adjustment and management of vascular access device: Secondary | ICD-10-CM

## 2014-06-07 DIAGNOSIS — C182 Malignant neoplasm of ascending colon: Secondary | ICD-10-CM

## 2014-06-07 MED ORDER — SODIUM CHLORIDE 0.9 % IJ SOLN
10.0000 mL | INTRAMUSCULAR | Status: DC | PRN
Start: 2014-06-07 — End: 2014-06-07
  Administered 2014-06-07: 10 mL
  Filled 2014-06-07: qty 10

## 2014-06-07 MED ORDER — HEPARIN SOD (PORK) LOCK FLUSH 100 UNIT/ML IV SOLN
500.0000 [IU] | Freq: Once | INTRAVENOUS | Status: AC | PRN
  Administered 2014-06-07: 500 [IU]
  Filled 2014-06-07: qty 5

## 2014-06-07 NOTE — Progress Notes (Signed)
Error -no injection ordered

## 2014-06-07 NOTE — Patient Instructions (Signed)
[facility] Discharge Instructions for Patients Receiving Chemotherapy  Today you received the following chemotherapy agents Leucovorin , 5 fu  To help prevent nausea and vomiting after your treatment, we encourage you to take your nausea medication as prescribed.   If you develop nausea and vomiting that is not controlled by your nausea medication, call the clinic.   BELOW ARE SYMPTOMS THAT SHOULD BE REPORTED IMMEDIATELY:  *FEVER GREATER THAN 100.5 F  *CHILLS WITH OR WITHOUT FEVER  NAUSEA AND VOMITING THAT IS NOT CONTROLLED WITH YOUR NAUSEA MEDICATION  *UNUSUAL SHORTNESS OF BREATH  *UNUSUAL BRUISING OR BLEEDING  TENDERNESS IN MOUTH AND THROAT WITH OR WITHOUT PRESENCE OF ULCERS  *URINARY PROBLEMS  *BOWEL PROBLEMS  UNUSUAL RASH Items with * indicate a potential emergency and should be followed up as soon as possible.  Feel free to call the clinic you have any questions or concerns. The clinic phone number is [phone].  Please show the [facility] at check-in to the Emergency Department and triage nurse.

## 2014-06-09 ENCOUNTER — Encounter: Payer: Self-pay | Admitting: Oncology

## 2014-06-09 NOTE — Progress Notes (Signed)
I faxed prior auth for potassium chloride to [facility]

## 2014-06-10 ENCOUNTER — Encounter: Payer: Self-pay | Admitting: Oncology

## 2014-06-10 NOTE — Progress Notes (Signed)
Per [facility] [id number] is on the list of covered drugs. [id number]. It can be filled at her pharmacy per [facility]. [phone]

## 2014-06-10 NOTE — Progress Notes (Signed)
Sent request again for Klor-Con.  Per [facility] potassium chloride was cancelled because could not use NDC code. Trying the alternative for prior approval

## 2014-06-15 ENCOUNTER — Other Ambulatory Visit: Payer: Self-pay | Admitting: Oncology

## 2014-06-19 ENCOUNTER — Ambulatory Visit (HOSPITAL_BASED_OUTPATIENT_CLINIC_OR_DEPARTMENT_OTHER): Payer: Medicare Other | Admitting: Nurse Practitioner

## 2014-06-19 ENCOUNTER — Telehealth: Payer: Self-pay | Admitting: Oncology

## 2014-06-19 ENCOUNTER — Other Ambulatory Visit (HOSPITAL_BASED_OUTPATIENT_CLINIC_OR_DEPARTMENT_OTHER): Payer: Medicare Other

## 2014-06-19 ENCOUNTER — Ambulatory Visit (HOSPITAL_BASED_OUTPATIENT_CLINIC_OR_DEPARTMENT_OTHER): Payer: Medicare Other

## 2014-06-19 VITALS — BP 154/88 | HR 73 | Temp 98.0°F | Resp 19 | Ht 62.0 in | Wt 172.2 lb

## 2014-06-19 DIAGNOSIS — C189 Malignant neoplasm of colon, unspecified: Secondary | ICD-10-CM

## 2014-06-19 DIAGNOSIS — D6959 Other secondary thrombocytopenia: Secondary | ICD-10-CM | POA: Diagnosis not present

## 2014-06-19 DIAGNOSIS — D509 Iron deficiency anemia, unspecified: Secondary | ICD-10-CM

## 2014-06-19 DIAGNOSIS — D701 Agranulocytosis secondary to cancer chemotherapy: Secondary | ICD-10-CM

## 2014-06-19 DIAGNOSIS — C180 Malignant neoplasm of cecum: Secondary | ICD-10-CM

## 2014-06-19 DIAGNOSIS — Z5111 Encounter for antineoplastic chemotherapy: Secondary | ICD-10-CM

## 2014-06-19 DIAGNOSIS — E876 Hypokalemia: Secondary | ICD-10-CM | POA: Diagnosis not present

## 2014-06-19 LAB — COMPREHENSIVE METABOLIC PANEL (CC13)
ALBUMIN: 3.4 g/dL — AB (ref 3.5–5.0)
ALT: 19 U/L (ref 0–55)
AST: 27 U/L (ref 5–34)
Alkaline Phosphatase: 90 U/L (ref 40–150)
Anion Gap: 9 mEq/L (ref 3–11)
BUN: 15.8 mg/dL (ref 7.0–26.0)
CALCIUM: 9 mg/dL (ref 8.4–10.4)
CO2: 27 mEq/L (ref 22–29)
CREATININE: 1 mg/dL (ref 0.6–1.1)
Chloride: 109 mEq/L (ref 98–109)
EGFR: 67 mL/min/{1.73_m2} — ABNORMAL LOW (ref >90)
Glucose: 93 mg/dl (ref 70–140)
POTASSIUM: 3.6 meq/L (ref 3.5–5.1)
Sodium: 145 mEq/L (ref 136–145)
Total Bilirubin: 0.28 mg/dL (ref 0.20–1.20)
Total Protein: 6.2 g/dL — ABNORMAL LOW (ref 6.4–8.3)

## 2014-06-19 LAB — CBC WITH DIFFERENTIAL/PLATELET
BASO%: 0.9 % (ref 0.0–2.0)
Basophils Absolute: 0 10*3/uL (ref 0.0–0.1)
EOS%: 1.6 % (ref 0.0–7.0)
Eosinophils Absolute: 0.1 10*3/uL (ref 0.0–0.5)
HEMATOCRIT: 37.2 % (ref 34.8–46.6)
HGB: 11.9 g/dL (ref 11.6–15.9)
LYMPH%: 26.4 % (ref 14.0–49.7)
MCH: 30.6 pg (ref 25.1–34.0)
MCHC: 32 g/dL (ref 31.5–36.0)
MCV: 95.6 fL (ref 79.5–101.0)
MONO#: 0.4 10*3/uL (ref 0.1–0.9)
MONO%: 11.2 % (ref 0.0–14.0)
NEUT#: 1.9 10*3/uL (ref 1.5–6.5)
NEUT%: 59.9 % (ref 38.4–76.8)
PLATELETS: 114 10*3/uL — AB (ref 145–400)
RBC: 3.89 10*6/uL (ref 3.70–5.45)
RDW: 18.5 % — ABNORMAL HIGH (ref 11.2–14.5)
WBC: 3.2 10*3/uL — AB (ref 3.9–10.3)
lymph#: 0.9 10*3/uL (ref 0.9–3.3)

## 2014-06-19 MED ORDER — OXALIPLATIN CHEMO INJECTION 100 MG/20ML
85.0000 mg/m2 | Freq: Once | INTRAVENOUS | Status: AC
  Administered 2014-06-19: 160 mg via INTRAVENOUS
  Filled 2014-06-19: qty 32

## 2014-06-19 MED ORDER — LEUCOVORIN CALCIUM INJECTION 350 MG
400.0000 mg/m2 | Freq: Once | INTRAVENOUS | Status: AC
  Administered 2014-06-19: 744 mg via INTRAVENOUS
  Filled 2014-06-19: qty 37.2

## 2014-06-19 MED ORDER — DEXTROSE 5 % IV SOLN
Freq: Once | INTRAVENOUS | Status: AC
  Administered 2014-06-19: 12:00:00 via INTRAVENOUS

## 2014-06-19 MED ORDER — ONDANSETRON 8 MG PO TBDP: 8.0000 mg | ORAL_TABLET | Freq: Three times a day (TID) | ORAL | Status: DC | PRN

## 2014-06-19 MED ORDER — PALONOSETRON HCL INJECTION 0.25 MG/5ML
INTRAVENOUS | Status: AC
  Filled 2014-06-19: qty 5

## 2014-06-19 MED ORDER — FLUOROURACIL CHEMO INJECTION 2.5 GM/50ML
400.0000 mg/m2 | Freq: Once | INTRAVENOUS | Status: AC
  Administered 2014-06-19: 750 mg via INTRAVENOUS
  Filled 2014-06-19: qty 15

## 2014-06-19 MED ORDER — SODIUM CHLORIDE 0.9 % IV SOLN
2400.0000 mg/m2 | INTRAVENOUS | Status: DC
  Administered 2014-06-19: 4450 mg via INTRAVENOUS
  Filled 2014-06-19: qty 89

## 2014-06-19 MED ORDER — PALONOSETRON HCL INJECTION 0.25 MG/5ML
0.2500 mg | Freq: Once | INTRAVENOUS | Status: AC
  Administered 2014-06-19: 0.25 mg via INTRAVENOUS

## 2014-06-19 MED ORDER — FOSAPREPITANT DIMEGLUMINE INJECTION 150 MG
Freq: Once | INTRAVENOUS | Status: AC
  Administered 2014-06-19: 12:00:00 via INTRAVENOUS
  Filled 2014-06-19: qty 5

## 2014-06-19 NOTE — Telephone Encounter (Signed)
Gave adn printed appt sched and avs for pt for May and JUNE °

## 2014-06-19 NOTE — Patient Instructions (Signed)
[facility] Discharge Instructions for Patients Receiving Chemotherapy  Today you received the following chemotherapy agents Leucovorin/Fluorouricil/Oxaliplatin.  To help prevent nausea and vomiting after your treatment, we encourage you to take your nausea medication as directed.   If you develop nausea and vomiting that is not controlled by your nausea medication, call the clinic.   BELOW ARE SYMPTOMS THAT SHOULD BE REPORTED IMMEDIATELY:  *FEVER GREATER THAN 100.5 F  *CHILLS WITH OR WITHOUT FEVER  NAUSEA AND VOMITING THAT IS NOT CONTROLLED WITH YOUR NAUSEA MEDICATION  *UNUSUAL SHORTNESS OF BREATH  *UNUSUAL BRUISING OR BLEEDING  TENDERNESS IN MOUTH AND THROAT WITH OR WITHOUT PRESENCE OF ULCERS  *URINARY PROBLEMS  *BOWEL PROBLEMS  UNUSUAL RASH Items with * indicate a potential emergency and should be followed up as soon as possible.  Feel free to call the clinic you have any questions or concerns. The clinic phone number is [phone].  Please show the [facility] at check-in to the Emergency Department and triage nurse.

## 2014-06-19 NOTE — Progress Notes (Signed)
  [facility] OFFICE PROGRESS NOTE   Diagnosis:  Colon cancer  INTERVAL HISTORY:   Gail Carlson returns as scheduled. She completed cycle 8 FOLFOX on 06/05/2014. Oxaliplatin was held due to thrombocytopenia. She denies nausea/vomiting. No mouth sores. No diarrhea. No numbness or tingling in her hands or feet. She reports that overall she feels well.  Objective:  Vital signs in last 24 hours:  Blood pressure 154/88, pulse 73, temperature 98 F (36.7 C), temperature source Oral, resp. rate 19, height $RemoveBe'5\' 2"'ERswEByBQ$  (1.575 m), weight 172 lb 3.2 oz (78.109 kg), SpO2 100 %.    HEENT: No thrush or ulcers. Resp: Lungs clear bilaterally. Cardio: Regular rate and rhythm. GI: Abdomen soft and nontender. No hepatomegaly. Vascular: No leg edema. Calves soft and nontender. Neuro: Vibratory sense intact over the fingertips per tuning fork exam.  Skin: No rash. Port-A-Cath without erythema.    Lab Results:  Lab Results  Component Value Date   WBC 3.2* 06/19/2014   HGB 11.9 06/19/2014   HCT 37.2 06/19/2014   MCV 95.6 06/19/2014   PLT 114* 06/19/2014   NEUTROABS 1.9 06/19/2014    Imaging:  No results found.  Medications: I have reviewed the patient's current medications.  Assessment/Plan: 1. 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  CTs of the chest, abdomen, and pelvis on 01/10/2014 with a cecal mass, nonspecific small liver lesions, and nonspecific lung nodules felt to most likely represent mucoid impaction  Cycle 1 FOLFOX 02/20/2014  Cycle 2 FOLFOX 03/06/2014  Cycle 3 FOLFOX 03/20/2014  Cycle 4 FOLFOX 04/03/2014  Cycle 5 FOLFOX held on 04/17/2014 due to neutropenia  Cycle 5 FOLFOX 04/24/2014 with Neulasta support  Cycle 6 FOLFOX 05/08/2014  Cycle 7 FOLFOX 05/22/2014 with Neulasta support  Cycle 8  FOLFOX 06/05/2014 (oxaliplatin held secondary to thrombocytopenia)  Cycle 9 FOLFOX 06/19/2014 with Neulasta support  2. Iron deficiency anemia 3. Family history of breast cancer 4. Port-A-Cath placement 02/18/2014 5. Delayed nausea following cycle 1 FOLFOX. Aloxi and Emend added beginning with cycle 2. Prophylactic Decadron added beginning with cycle 3. 6. Neutropenia following cycle 4 FOLFOX. Neulasta will be added with cycle 5. 7. Hypokalemia 04/17/2014. Question secondary to diarrhea. Potassium supplement initiated. 8. Left upper arm pain. Negative venous Doppler 03/07/2014. She has been referred to orthopedics.   Disposition: Gail Carlson appears stable. She has completed 8 cycles of FOLFOX. Oxaliplatin was held with cycle 8 due to thrombocytopenia. The platelet count is better. Plan to proceed with cycle 9 FOLFOX today as scheduled with oxaliplatin. She will receive Neulasta on the day of pump discontinuation. She will return for a follow-up visit and cycle 10 FOLFOX in 2 weeks. She will contact the office in the interim with any problems. We specifically discussed bleeding.  Plan reviewed with Dr. Benay Spice.    Ned Card ANP/GNP-BC   06/19/2014  11:11 AM

## 2014-06-20 ENCOUNTER — Other Ambulatory Visit: Payer: Self-pay | Admitting: Nurse Practitioner

## 2014-06-20 ENCOUNTER — Telehealth: Payer: Self-pay | Admitting: *Deleted

## 2014-06-20 DIAGNOSIS — M25512 Pain in left shoulder: Secondary | ICD-10-CM

## 2014-06-20 DIAGNOSIS — C189 Malignant neoplasm of colon, unspecified: Secondary | ICD-10-CM

## 2014-06-20 MED ORDER — HYDROCODONE-ACETAMINOPHEN 5-325 MG PO TABS: 1.0000 | ORAL_TABLET | Freq: Three times a day (TID) | ORAL | Status: DC | PRN

## 2014-06-20 NOTE — Telephone Encounter (Signed)
VM message from pt requesting Ned Card, NP refill her Hydrocodone/apap. She states that Tramadol does not help her at all.

## 2014-06-20 NOTE — Telephone Encounter (Signed)
TC to patient to discuss pain meds. She states she has pain in her shoulder, has scheduled an orthopedic appt for the end of June, but has not seen the orthopedic MD yet.  Pt states " I told [name] yesterday that tramadol is no good, that I need the hydrocodene"  " Nothing else helps this pain"

## 2014-06-20 NOTE — Telephone Encounter (Signed)
Called and informed patient that prescription will be ready for pick up but would need to be here by 4:30 to pick up.  Also, asked patient if she has been scheduled with the orthopedic.  Patient stated that she has been scheduled for June 21st at [facility] and would like a sooner appointment.  Informed patient that I would call [facility].  Office is closed will call back Monday.

## 2014-06-20 NOTE — Telephone Encounter (Signed)
Could you please find out why she is taking pain medication?

## 2014-06-21 ENCOUNTER — Ambulatory Visit (HOSPITAL_BASED_OUTPATIENT_CLINIC_OR_DEPARTMENT_OTHER): Payer: Medicare Other

## 2014-06-21 ENCOUNTER — Ambulatory Visit: Payer: Medicare Other

## 2014-06-21 VITALS — BP 113/90 | HR 68 | Temp 98.6°F | Resp 16

## 2014-06-21 DIAGNOSIS — C180 Malignant neoplasm of cecum: Secondary | ICD-10-CM

## 2014-06-21 DIAGNOSIS — Z5189 Encounter for other specified aftercare: Secondary | ICD-10-CM

## 2014-06-21 DIAGNOSIS — C189 Malignant neoplasm of colon, unspecified: Secondary | ICD-10-CM

## 2014-06-21 MED ORDER — HEPARIN SOD (PORK) LOCK FLUSH 100 UNIT/ML IV SOLN
500.0000 [IU] | Freq: Once | INTRAVENOUS | Status: AC | PRN
  Administered 2014-06-21: 500 [IU]
  Filled 2014-06-21: qty 5

## 2014-06-21 MED ORDER — SODIUM CHLORIDE 0.9 % IJ SOLN
10.0000 mL | INTRAMUSCULAR | Status: DC | PRN
  Administered 2014-06-21: 10 mL
  Filled 2014-06-21: qty 10

## 2014-06-21 MED ORDER — PEGFILGRASTIM INJECTION 6 MG/0.6ML ~~LOC~~
6.0000 mg | PREFILLED_SYRINGE | Freq: Once | SUBCUTANEOUS | Status: AC
  Administered 2014-06-21: 6 mg via SUBCUTANEOUS

## 2014-06-21 NOTE — Progress Notes (Signed)
Duplicate appointment 

## 2014-06-29 ENCOUNTER — Other Ambulatory Visit: Payer: Self-pay | Admitting: Oncology

## 2014-07-03 ENCOUNTER — Other Ambulatory Visit (HOSPITAL_BASED_OUTPATIENT_CLINIC_OR_DEPARTMENT_OTHER): Payer: Medicare Other

## 2014-07-03 ENCOUNTER — Telehealth: Payer: Self-pay | Admitting: *Deleted

## 2014-07-03 ENCOUNTER — Ambulatory Visit (HOSPITAL_BASED_OUTPATIENT_CLINIC_OR_DEPARTMENT_OTHER): Payer: Medicare Other

## 2014-07-03 ENCOUNTER — Telehealth: Payer: Self-pay | Admitting: Nurse Practitioner

## 2014-07-03 ENCOUNTER — Other Ambulatory Visit: Payer: Self-pay | Admitting: *Deleted

## 2014-07-03 ENCOUNTER — Ambulatory Visit (HOSPITAL_BASED_OUTPATIENT_CLINIC_OR_DEPARTMENT_OTHER): Payer: Medicare Other | Admitting: Nurse Practitioner

## 2014-07-03 ENCOUNTER — Ambulatory Visit: Payer: Medicare Other | Admitting: Nutrition

## 2014-07-03 VITALS — BP 171/83 | HR 72 | Temp 98.3°F | Resp 18 | Ht 62.0 in | Wt 168.7 lb

## 2014-07-03 DIAGNOSIS — C180 Malignant neoplasm of cecum: Secondary | ICD-10-CM

## 2014-07-03 DIAGNOSIS — E876 Hypokalemia: Secondary | ICD-10-CM

## 2014-07-03 DIAGNOSIS — Z5111 Encounter for antineoplastic chemotherapy: Secondary | ICD-10-CM

## 2014-07-03 DIAGNOSIS — C189 Malignant neoplasm of colon, unspecified: Secondary | ICD-10-CM

## 2014-07-03 DIAGNOSIS — D6959 Other secondary thrombocytopenia: Secondary | ICD-10-CM | POA: Diagnosis not present

## 2014-07-03 DIAGNOSIS — D509 Iron deficiency anemia, unspecified: Secondary | ICD-10-CM | POA: Diagnosis not present

## 2014-07-03 DIAGNOSIS — D701 Agranulocytosis secondary to cancer chemotherapy: Secondary | ICD-10-CM

## 2014-07-03 LAB — COMPREHENSIVE METABOLIC PANEL (CC13)
ALK PHOS: 139 U/L (ref 40–150)
ALT: 23 U/L (ref 0–55)
AST: 32 U/L (ref 5–34)
Albumin: 3.4 g/dL — ABNORMAL LOW (ref 3.5–5.0)
Anion Gap: 9 mEq/L (ref 3–11)
BUN: 9.8 mg/dL (ref 7.0–26.0)
CO2: 29 mEq/L (ref 22–29)
Calcium: 8.9 mg/dL (ref 8.4–10.4)
Chloride: 108 mEq/L (ref 98–109)
Creatinine: 1.1 mg/dL (ref 0.6–1.1)
EGFR: 64 mL/min/{1.73_m2} — ABNORMAL LOW (ref >90)
Glucose: 101 mg/dl (ref 70–140)
POTASSIUM: 3 meq/L — AB (ref 3.5–5.1)
SODIUM: 145 meq/L (ref 136–145)
TOTAL PROTEIN: 6.4 g/dL (ref 6.4–8.3)
Total Bilirubin: 0.33 mg/dL (ref 0.20–1.20)

## 2014-07-03 LAB — CBC WITH DIFFERENTIAL/PLATELET
BASO%: 0.9 % (ref 0.0–2.0)
Basophils Absolute: 0.1 10*3/uL (ref 0.0–0.1)
EOS%: 0.2 % (ref 0.0–7.0)
Eosinophils Absolute: 0 10*3/uL (ref 0.0–0.5)
HCT: 38.5 % (ref 34.8–46.6)
HEMOGLOBIN: 12.3 g/dL (ref 11.6–15.9)
LYMPH#: 0.7 10*3/uL — AB (ref 0.9–3.3)
LYMPH%: 7.2 % — ABNORMAL LOW (ref 14.0–49.7)
MCH: 31.1 pg (ref 25.1–34.0)
MCHC: 32.1 g/dL (ref 31.5–36.0)
MCV: 96.9 fL (ref 79.5–101.0)
MONO#: 0.6 10*3/uL (ref 0.1–0.9)
MONO%: 6.2 % (ref 0.0–14.0)
NEUT#: 7.8 10*3/uL — ABNORMAL HIGH (ref 1.5–6.5)
NEUT%: 85.5 % — ABNORMAL HIGH (ref 38.4–76.8)
Platelets: 73 10*3/uL — ABNORMAL LOW (ref 145–400)
RBC: 3.97 10*6/uL (ref 3.70–5.45)
RDW: 18.1 % — ABNORMAL HIGH (ref 11.2–14.5)
WBC: 9.1 10*3/uL (ref 3.9–10.3)

## 2014-07-03 MED ORDER — DEXTROSE 5 % IV SOLN
Freq: Once | INTRAVENOUS | Status: AC
  Administered 2014-07-03: 10:00:00 via INTRAVENOUS

## 2014-07-03 MED ORDER — FLUOROURACIL CHEMO INJECTION 2.5 GM/50ML
400.0000 mg/m2 | Freq: Once | INTRAVENOUS | Status: AC
  Administered 2014-07-03: 750 mg via INTRAVENOUS
  Filled 2014-07-03: qty 15

## 2014-07-03 MED ORDER — DEXTROSE 5 % IV SOLN
400.0000 mg/m2 | Freq: Once | INTRAVENOUS | Status: AC
  Administered 2014-07-03: 744 mg via INTRAVENOUS
  Filled 2014-07-03: qty 37.2

## 2014-07-03 MED ORDER — SODIUM CHLORIDE 0.9 % IV SOLN
2400.0000 mg/m2 | INTRAVENOUS | Status: DC
  Administered 2014-07-03: 4450 mg via INTRAVENOUS
  Filled 2014-07-03: qty 89

## 2014-07-03 NOTE — Progress Notes (Signed)
64 year old female diagnosed with colon cancer receiving chemotherapy.  Patient was referred by nurse navigator.  Past medical history includes anemia, hypertension, and GERD.  Medications include Decadron, ferrous sulfate, Ativan, Zofran, and Compazine.  Labs include potassium 3.0.  Height: 62 inches. Weight: 168.7 pounds. Usual body weight: 185 pounds in 2015. BMI: 30.85.  Patient reports she has nausea daily and is taking Zofran once in the morning. Patient does have occasional diarrhea. She has been taking Imodium, which has helped Patient reports her appetite comes and goes.  Patient endorses continued weight loss.  Nutrition diagnosis: Inadequate oral intake related to new diagnosis of colon cancer and associated side effects as evidenced by 9% weight loss from usual body weight.  Intervention:  Educated patient to consume small frequent meals and snacks utilizing high-calorie high-protein foods to support maintenance of lean body mass. Educated patient on strategies for eating if she has nausea and vomiting. Encouraged patient to take nausea medication as prescribed for nausea. Provided fact sheet on nausea and vomiting. Educated patient on foods to avoid and foods to choose when she has diarrhea and provided fact sheet. Reviewed high potassium foods with patient and provided fact sheet. Questions were answered.  Teach back method used and contact information was given.  Monitoring, evaluation, goals: Patient will tolerate increased calories and protein to promote weight maintenance.  Next visit: Thursday, June 16, during chemotherapy.  **Disclaimer: This note was dictated with voice recognition software. Similar sounding words can inadvertently be transcribed and this note may contain transcription errors which may not have been corrected upon publication of note.**

## 2014-07-03 NOTE — Telephone Encounter (Signed)
per pof to sch pt appt-sent MW email to sch trmt-gave pt copy of sch °

## 2014-07-03 NOTE — Patient Instructions (Signed)
[facility] Discharge Instructions for Patients Receiving Chemotherapy  Today you received the following chemotherapy agents Leucovorin, 5-FU  To help prevent nausea and vomiting after your treatment, we encourage you to take your nausea medication     If you develop nausea and vomiting that is not controlled by your nausea medication, call the clinic.   BELOW ARE SYMPTOMS THAT SHOULD BE REPORTED IMMEDIATELY:  *FEVER GREATER THAN 100.5 F  *CHILLS WITH OR WITHOUT FEVER  NAUSEA AND VOMITING THAT IS NOT CONTROLLED WITH YOUR NAUSEA MEDICATION  *UNUSUAL SHORTNESS OF BREATH  *UNUSUAL BRUISING OR BLEEDING  TENDERNESS IN MOUTH AND THROAT WITH OR WITHOUT PRESENCE OF ULCERS  *URINARY PROBLEMS  *BOWEL PROBLEMS  UNUSUAL RASH Items with * indicate a potential emergency and should be followed up as soon as possible.  Feel free to call the clinic you have any questions or concerns. The clinic phone number is [phone].  Please show the [place] at check-in to the Emergency Department and triage nurse.

## 2014-07-03 NOTE — Telephone Encounter (Signed)
Per staff message and POF I have scheduled appts. Advised scheduler of appts. JMW  

## 2014-07-03 NOTE — Progress Notes (Signed)
  [facility] OFFICE PROGRESS NOTE   Diagnosis: Colon cancer   INTERVAL HISTORY:   [name] returns as scheduled. She completed cycle 9 FOLFOX 06/19/2014. She had mild nausea for one day. She had diarrhea for 2 days. She developed a single lip sore which has resolved. She has intermittent numbness in the fingertips on the left hand. She continues to have left shoulder pain. She has an upcoming appointment with orthopedics. She has recently noted hair loss. She denies any bleeding.  Objective:  Vital signs in last 24 hours:  Blood pressure 171/83, pulse 72, temperature 98.3 F (36.8 C), temperature source Oral, resp. rate 18, height $RemoveBe'5\' 2"'PPEbqsJYJ$  (1.575 m), weight 168 lb 11.2 oz (76.522 kg), SpO2 100 %.    HEENT: No thrush or ulcers. Resp: Lungs clear bilaterally. Cardio: Regular rate and rhythm. GI: Abdomen soft and nontender. No hepatomegaly. Vascular: No upper or lower extremity edema. Calves soft and nontender. Neuro: Vibratory sense intact over the fingertips per tuning fork exam. Upper extremity motor strength 5 over 5.  Port-A-Cath without erythema.    Lab Results:  Lab Results  Component Value Date   WBC 9.1 07/03/2014   HGB 12.3 07/03/2014   HCT 38.5 07/03/2014   MCV 96.9 07/03/2014   PLT 73* 07/03/2014   NEUTROABS 7.8* 07/03/2014    Imaging:  No results found.  Medications: I have reviewed the patient's current medications.  Assessment/Plan: 1. 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  CTs of the chest, abdomen, and pelvis on 01/10/2014 with a cecal mass, nonspecific small liver lesions, and nonspecific lung nodules felt to most likely represent mucoid impaction  Cycle 1 FOLFOX 02/20/2014  Cycle 2 FOLFOX 03/06/2014  Cycle 3 FOLFOX 03/20/2014  Cycle 4 FOLFOX 04/03/2014  Cycle 5  FOLFOX held on 04/17/2014 due to neutropenia  Cycle 5 FOLFOX 04/24/2014 with Neulasta support  Cycle 6 FOLFOX 05/08/2014  Cycle 7 FOLFOX 05/22/2014 with Neulasta support  Cycle 8 FOLFOX 06/05/2014 (oxaliplatin held secondary to thrombocytopenia)  Cycle 9 FOLFOX 06/19/2014 with Neulasta support  Cycle 10 FOLFOX 07/03/2014 (oxaliplatin held secondary to thrombocytopenia)  2. Iron deficiency anemia 3. Family history of breast cancer 4. Port-A-Cath placement 02/18/2014 5. Delayed nausea following cycle 1 FOLFOX. Aloxi and Emend added beginning with cycle 2. Prophylactic Decadron added beginning with cycle 3. 6. Neutropenia following cycle 4 FOLFOX. Neulasta will be added with cycle 5. 7. Hypokalemia 04/17/2014. Question secondary to diarrhea. Potassium supplement initiated. 8. Left upper arm pain. Negative venous Doppler 03/07/2014. She has been referred to orthopedics.   Disposition: Gail Carlson appears stable. She has completed 9 cycles of FOLFOX. Plan to proceed with cycle 10 today as scheduled. The oxaliplatin will be held due to thrombocytopenia. She will return for a follow-up visit and cycle 11 in 2 weeks. She will contact the office in the interim with any problems. We specifically discussed bleeding.  Plan reviewed with Dr. Benay Spice.    Ned Card ANP/GNP-BC   07/03/2014  9:03 AM

## 2014-07-03 NOTE — Progress Notes (Signed)
Okay to treat today, despite platelet count, per Ned Card, NP

## 2014-07-03 NOTE — [facility] Oncology Navigator Note (Signed)
Oncology Nurse Navigator Documentation  Oncology Nurse Navigator Flowsheets 07/03/2014  Navigator Encounter Type Treatment  Patient Visit Type Medonc  Treatment Phase Treatment # 10  Barriers/Navigation Needs Transportation;Education--she has not reached out to [employer] yet for transportation assistance saying she is particular about who she rides with. Encouraged her to call and request female driver and told her they will not enter her home. She will go to the car. Provided with handout on high potassium foods and made referral to dietician at her request.  Time Spent with Patient 10

## 2014-07-04 ENCOUNTER — Telehealth: Payer: Self-pay | Admitting: *Deleted

## 2014-07-04 NOTE — Telephone Encounter (Signed)
-----   Message from Ladell Pier, MD sent at 07/03/2014  9:17 PM EDT ----- Please call patient, if she has been taking potassium increase to twice daily

## 2014-07-04 NOTE — Telephone Encounter (Signed)
Called pt, she reports she was not taking potassium regularly due to difficulty swallowing tablets. She reports she was instructed to take 20 meq daily during visit on 6/2. Pt will break tablet in half and take both halves daily. Pt reports she received a letter from her insurance company that liquid potassium was approved. She will check with her pharmacy.

## 2014-07-04 NOTE — Telephone Encounter (Signed)
PT. CALLED. THE PHARMACY HAS THE LIQUID POTASSIUM. SHE CAN AFFORD THE PRICE OF THE MEDICATION AND IS ON HER WAY TO THE PHARMACY.

## 2014-07-05 ENCOUNTER — Ambulatory Visit (HOSPITAL_BASED_OUTPATIENT_CLINIC_OR_DEPARTMENT_OTHER): Payer: Medicare Other

## 2014-07-05 ENCOUNTER — Ambulatory Visit: Payer: Medicare Other

## 2014-07-05 VITALS — BP 155/91 | HR 83 | Temp 98.9°F | Resp 18

## 2014-07-05 DIAGNOSIS — C180 Malignant neoplasm of cecum: Secondary | ICD-10-CM | POA: Diagnosis not present

## 2014-07-05 DIAGNOSIS — C189 Malignant neoplasm of colon, unspecified: Secondary | ICD-10-CM

## 2014-07-05 MED ORDER — HEPARIN SOD (PORK) LOCK FLUSH 100 UNIT/ML IV SOLN
500.0000 [IU] | Freq: Once | INTRAVENOUS | Status: AC | PRN
  Administered 2014-07-05: 500 [IU]
  Filled 2014-07-05: qty 5

## 2014-07-05 MED ORDER — SODIUM CHLORIDE 0.9 % IJ SOLN
10.0000 mL | INTRAMUSCULAR | Status: DC | PRN
Start: 2014-07-05 — End: 2014-07-05
  Administered 2014-07-05: 10 mL
  Filled 2014-07-05: qty 10

## 2014-07-13 ENCOUNTER — Other Ambulatory Visit: Payer: Self-pay | Admitting: Oncology

## 2014-07-17 ENCOUNTER — Ambulatory Visit (HOSPITAL_BASED_OUTPATIENT_CLINIC_OR_DEPARTMENT_OTHER): Payer: Medicare Other

## 2014-07-17 ENCOUNTER — Other Ambulatory Visit (HOSPITAL_BASED_OUTPATIENT_CLINIC_OR_DEPARTMENT_OTHER): Payer: Medicare Other

## 2014-07-17 ENCOUNTER — Ambulatory Visit (HOSPITAL_BASED_OUTPATIENT_CLINIC_OR_DEPARTMENT_OTHER): Payer: Medicare Other | Admitting: Oncology

## 2014-07-17 ENCOUNTER — Ambulatory Visit: Payer: Medicare Other | Admitting: Nutrition

## 2014-07-17 VITALS — BP 194/96 | HR 58 | Temp 98.4°F | Resp 18 | Ht 62.0 in | Wt 167.7 lb

## 2014-07-17 VITALS — BP 185/81 | HR 58

## 2014-07-17 DIAGNOSIS — I10 Essential (primary) hypertension: Secondary | ICD-10-CM

## 2014-07-17 DIAGNOSIS — Z5111 Encounter for antineoplastic chemotherapy: Secondary | ICD-10-CM | POA: Diagnosis not present

## 2014-07-17 DIAGNOSIS — D509 Iron deficiency anemia, unspecified: Secondary | ICD-10-CM | POA: Diagnosis not present

## 2014-07-17 DIAGNOSIS — D6959 Other secondary thrombocytopenia: Secondary | ICD-10-CM | POA: Diagnosis not present

## 2014-07-17 DIAGNOSIS — C180 Malignant neoplasm of cecum: Secondary | ICD-10-CM

## 2014-07-17 DIAGNOSIS — D701 Agranulocytosis secondary to cancer chemotherapy: Secondary | ICD-10-CM

## 2014-07-17 DIAGNOSIS — E876 Hypokalemia: Secondary | ICD-10-CM

## 2014-07-17 DIAGNOSIS — C189 Malignant neoplasm of colon, unspecified: Secondary | ICD-10-CM

## 2014-07-17 DIAGNOSIS — G620 Drug-induced polyneuropathy: Secondary | ICD-10-CM | POA: Diagnosis not present

## 2014-07-17 LAB — CBC WITH DIFFERENTIAL/PLATELET
BASO%: 0.7 % (ref 0.0–2.0)
BASOS ABS: 0 10*3/uL (ref 0.0–0.1)
EOS ABS: 0 10*3/uL (ref 0.0–0.5)
EOS%: 0.5 % (ref 0.0–7.0)
HCT: 39.5 % (ref 34.8–46.6)
HGB: 12.7 g/dL (ref 11.6–15.9)
LYMPH%: 30.3 % (ref 14.0–49.7)
MCH: 32.1 pg (ref 25.1–34.0)
MCHC: 32.2 g/dL (ref 31.5–36.0)
MCV: 99.7 fL (ref 79.5–101.0)
MONO#: 0.5 10*3/uL (ref 0.1–0.9)
MONO%: 11.2 % (ref 0.0–14.0)
NEUT%: 57.3 % (ref 38.4–76.8)
NEUTROS ABS: 2.4 10*3/uL (ref 1.5–6.5)
Platelets: 113 10*3/uL — ABNORMAL LOW (ref 145–400)
RBC: 3.96 10*6/uL (ref 3.70–5.45)
RDW: 16 % — ABNORMAL HIGH (ref 11.2–14.5)
WBC: 4.2 10*3/uL (ref 3.9–10.3)
lymph#: 1.3 10*3/uL (ref 0.9–3.3)

## 2014-07-17 LAB — COMPREHENSIVE METABOLIC PANEL (CC13)
ALBUMIN: 3.6 g/dL (ref 3.5–5.0)
ALT: 23 U/L (ref 0–55)
AST: 26 U/L (ref 5–34)
Alkaline Phosphatase: 89 U/L (ref 40–150)
Anion Gap: 7 mEq/L (ref 3–11)
BILIRUBIN TOTAL: 0.4 mg/dL (ref 0.20–1.20)
BUN: 7 mg/dL (ref 7.0–26.0)
CHLORIDE: 109 meq/L (ref 98–109)
CO2: 30 meq/L — AB (ref 22–29)
Calcium: 8.9 mg/dL (ref 8.4–10.4)
Creatinine: 1.1 mg/dL (ref 0.6–1.1)
EGFR: 59 mL/min/{1.73_m2} — AB (ref >90)
Glucose: 83 mg/dl (ref 70–140)
POTASSIUM: 3.3 meq/L — AB (ref 3.5–5.1)
SODIUM: 146 meq/L — AB (ref 136–145)
TOTAL PROTEIN: 6.6 g/dL (ref 6.4–8.3)

## 2014-07-17 MED ORDER — FLUOROURACIL CHEMO INJECTION 5 GM/100ML
2400.0000 mg/m2 | INTRAVENOUS | Status: DC
  Administered 2014-07-17: 4450 mg via INTRAVENOUS
  Filled 2014-07-17: qty 89

## 2014-07-17 MED ORDER — SODIUM CHLORIDE 0.9 % IV SOLN
Freq: Once | INTRAVENOUS | Status: AC
  Administered 2014-07-17: 11:00:00 via INTRAVENOUS
  Filled 2014-07-17: qty 5

## 2014-07-17 MED ORDER — PALONOSETRON HCL INJECTION 0.25 MG/5ML
0.2500 mg | Freq: Once | INTRAVENOUS | Status: AC
Start: 2014-07-17 — End: 2014-07-17
  Administered 2014-07-17: 0.25 mg via INTRAVENOUS

## 2014-07-17 MED ORDER — DEXTROSE 5 % IV SOLN
Freq: Once | INTRAVENOUS | Status: AC
  Administered 2014-07-17: 11:00:00 via INTRAVENOUS

## 2014-07-17 MED ORDER — LEUCOVORIN CALCIUM INJECTION 350 MG
400.0000 mg/m2 | Freq: Once | INTRAVENOUS | Status: AC
  Administered 2014-07-17: 744 mg via INTRAVENOUS
  Filled 2014-07-17: qty 37.2

## 2014-07-17 MED ORDER — FLUOROURACIL CHEMO INJECTION 2.5 GM/50ML
400.0000 mg/m2 | Freq: Once | INTRAVENOUS | Status: AC
  Administered 2014-07-17: 750 mg via INTRAVENOUS
  Filled 2014-07-17: qty 15

## 2014-07-17 MED ORDER — OXALIPLATIN CHEMO INJECTION 100 MG/20ML
85.0000 mg/m2 | Freq: Once | INTRAVENOUS | Status: AC
  Administered 2014-07-17: 160 mg via INTRAVENOUS
  Filled 2014-07-17: qty 32

## 2014-07-17 MED ORDER — HEPARIN SOD (PORK) LOCK FLUSH 100 UNIT/ML IV SOLN
500.0000 [IU] | Freq: Once | INTRAVENOUS | Status: DC | PRN
  Filled 2014-07-17: qty 5

## 2014-07-17 MED ORDER — PALONOSETRON HCL INJECTION 0.25 MG/5ML
INTRAVENOUS | Status: AC
  Filled 2014-07-17: qty 5

## 2014-07-17 MED ORDER — SODIUM CHLORIDE 0.9 % IJ SOLN
10.0000 mL | INTRAMUSCULAR | Status: DC | PRN
  Filled 2014-07-17: qty 10

## 2014-07-17 NOTE — Patient Instructions (Signed)
[facility] Discharge Instructions for Patients Receiving Chemotherapy  Today you received the following chemotherapy agents:  Leucovorin, oxalitplatin, 5FU,   To help prevent nausea and vomiting after your treatment, we encourage you to take your nausea medication.   If you develop nausea and vomiting that is not controlled by your nausea medication, call the clinic.   BELOW ARE SYMPTOMS THAT SHOULD BE REPORTED IMMEDIATELY:  *FEVER GREATER THAN 100.5 F  *CHILLS WITH OR WITHOUT FEVER  NAUSEA AND VOMITING THAT IS NOT CONTROLLED WITH YOUR NAUSEA MEDICATION  *UNUSUAL SHORTNESS OF BREATH  *UNUSUAL BRUISING OR BLEEDING  TENDERNESS IN MOUTH AND THROAT WITH OR WITHOUT PRESENCE OF ULCERS  *URINARY PROBLEMS  *BOWEL PROBLEMS  UNUSUAL RASH Items with * indicate a potential emergency and should be followed up as soon as possible.  Feel free to call the clinic you have any questions or concerns. The clinic phone number is [phone].  Please show the [facility] at check-in to the Emergency Department and triage nurse.  AVOID touching or drinking cold products.

## 2014-07-17 NOTE — Progress Notes (Signed)
  [facility] OFFICE PROGRESS NOTE   Diagnosis: Colon cancer  INTERVAL HISTORY:   Ms. Rundquist returns as scheduled. She completed another cycle of chemotherapy on 07/03/2014. Oxaliplatin was held secondary to thrombocytopenia. She denies nausea and mouth sores. She has developed partial alopecia. She had one day of diarrhea following chemotherapy. She has numbness at the left fingers. This does not interfere with activity. She saw orthopedics for evaluation of left shoulder pain and reports being diagnosed with "bursitis ".  Objective:  Vital signs in last 24 hours:  Blood pressure 194/96, pulse 58, temperature 98.4 F (36.9 C), temperature source Oral, resp. rate 18, height _0  (1.575 m), weight 167 lb 11.2 oz (76.068 kg), SpO2 100 %.    HEENT: Hyperpigmentation of the buccal mucosa, no thrush Resp: Lungs clear bilaterally Cardio: Regular rate and rhythm GI: No hepatosplenic the, nontender Vascular: No leg edema Neuro: Mild decrease in vibratory sense at the fingertips bilaterally.    Portacath/PICC-without erythema  Lab Results:  Lab Results  Component Value Date   WBC 4.2 07/17/2014   HGB 12.7 07/17/2014   HCT 39.5 07/17/2014   MCV 99.7 07/17/2014   PLT 113* 07/17/2014   NEUTROABS 2.4 07/17/2014      Lab Results  Component Value Date   CEA 1.5 01/11/2014     Medications: I have reviewed the patient's current medications.  Assessment/Plan: 1. 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  CTs of the chest, abdomen, and pelvis on 01/10/2014 with a cecal mass, nonspecific small liver lesions, and nonspecific lung nodules felt to most likely represent mucoid impaction  Cycle 1 FOLFOX 02/20/2014  Cycle 2 FOLFOX 03/06/2014  Cycle 3 FOLFOX 03/20/2014  Cycle 4 FOLFOX 04/03/2014  Cycle  5 FOLFOX held on 04/17/2014 due to neutropenia  Cycle 5 FOLFOX 04/24/2014 with Neulasta support  Cycle 6 FOLFOX 05/08/2014  Cycle 7 FOLFOX 05/22/2014 with Neulasta support  Cycle 8 FOLFOX 06/05/2014 (oxaliplatin held secondary to thrombocytopenia)  Cycle 9 FOLFOX 06/19/2014 with Neulasta support  Cycle 10 FOLFOX 07/03/2014 (oxaliplatin held secondary to thrombocytopenia)  Cycle 11 FOLFOX 07/17/2014  2. History of Iron deficiency anemia 3. Family history of breast cancer 4. Port-A-Cath placement 02/18/2014 5. Delayed nausea following cycle 1 FOLFOX. Aloxi and Emend added beginning with cycle 2. Prophylactic Decadron added beginning with cycle 3. 6. Neutropenia following cycle 4 FOLFOX. Neulasta was added with cycle 5. 7. Hypokalemia 04/17/2014. Question secondary to diarrhea. Potassium supplement initiated. 8. Left upper arm pain. Negative venous Doppler 03/07/2014. She was referred to orthopedics. 9. Thrombocytopenia secondary to chemotherapy-improved 10. Mild oxaliplatin neuropathy 11. Hypertension   Disposition:  Ms. Thien has completed 10 cycles of adjuvant therapy. The plan is to proceed with cycle 11 today. Oxaliplatin will be resumed with cycle 11. She has mild neuropathy symptoms, but this does not interfere with activity and does not appear progressive.  Mr. Teehan will return for an office visit and final cycle of chemotherapy in 2 weeks. We will repeat her blood pressure in the chemotherapy room today.  [name], MD  07/17/2014  10:23 AM

## 2014-07-17 NOTE — Progress Notes (Signed)
Nutrition follow up completed with patient during chemotherapy for colon cancer. Weight is stable at 167.7 pounds on June 16. Patient denies current nutrition impact symptoms. Reports appetite has improved. Potassium improved at 3.3.  Nutrition diagnosis:  Inadequate oral intake improved.  Intervention:  Educated patient to continue small frequent meals and snacks with adequate calories and protein. Patient should continue nausea medications as prescribed/needed. Questions answered and teach back method used.  Monitoring, Evaluation, Goals: Patient is maintaining weight with improved nutrition impact symptoms.  Next Visit: Thursday, June 30.

## 2014-07-18 ENCOUNTER — Other Ambulatory Visit: Payer: Self-pay | Admitting: *Deleted

## 2014-07-18 DIAGNOSIS — C189 Malignant neoplasm of colon, unspecified: Secondary | ICD-10-CM

## 2014-07-18 MED ORDER — DEXAMETHASONE 4 MG PO TABS: ORAL_TABLET | ORAL | Status: DC

## 2014-07-18 NOTE — Telephone Encounter (Signed)
TC from pt requesting refill on her Dexamethasone. Refills escribed to [facility] on [address]. Pt notified of this.

## 2014-07-19 ENCOUNTER — Ambulatory Visit (HOSPITAL_BASED_OUTPATIENT_CLINIC_OR_DEPARTMENT_OTHER): Payer: Medicare Other

## 2014-07-19 DIAGNOSIS — C180 Malignant neoplasm of cecum: Secondary | ICD-10-CM | POA: Diagnosis not present

## 2014-07-19 DIAGNOSIS — Z5189 Encounter for other specified aftercare: Secondary | ICD-10-CM | POA: Diagnosis not present

## 2014-07-19 MED ORDER — PEGFILGRASTIM INJECTION 6 MG/0.6ML ~~LOC~~
6.0000 mg | PREFILLED_SYRINGE | Freq: Once | SUBCUTANEOUS | Status: AC
  Administered 2014-07-19: 6 mg via SUBCUTANEOUS

## 2014-07-19 MED ORDER — HEPARIN SOD (PORK) LOCK FLUSH 100 UNIT/ML IV SOLN
500.0000 [IU] | Freq: Once | INTRAVENOUS | Status: DC | PRN
Start: 2014-07-19 — End: 2014-07-19
  Filled 2014-07-19: qty 5

## 2014-07-19 MED ORDER — SODIUM CHLORIDE 0.9 % IJ SOLN
10.0000 mL | INTRAMUSCULAR | Status: DC | PRN
  Filled 2014-07-19: qty 10

## 2014-07-19 NOTE — Patient Instructions (Signed)
[facility] Discharge Instructions for Patients Receiving Chemotherapy  Today you received the following chemotherapy agents:  Leucovorin, oxalitplatin, 5FU,   To help prevent nausea and vomiting after your treatment, we encourage you to take your nausea medication.   If you develop nausea and vomiting that is not controlled by your nausea medication, call the clinic.   BELOW ARE SYMPTOMS THAT SHOULD BE REPORTED IMMEDIATELY:  *FEVER GREATER THAN 100.5 F  *CHILLS WITH OR WITHOUT FEVER  NAUSEA AND VOMITING THAT IS NOT CONTROLLED WITH YOUR NAUSEA MEDICATION  *UNUSUAL SHORTNESS OF BREATH  *UNUSUAL BRUISING OR BLEEDING  TENDERNESS IN MOUTH AND THROAT WITH OR WITHOUT PRESENCE OF ULCERS  *URINARY PROBLEMS  *BOWEL PROBLEMS  UNUSUAL RASH Items with * indicate a potential emergency and should be followed up as soon as possible.  Feel free to call the clinic you have any questions or concerns. The clinic phone number is [phone].  Please show the [facility] at check-in to the Emergency Department and triage nurse.  AVOID touching or drinking cold products.

## 2014-07-27 ENCOUNTER — Other Ambulatory Visit: Payer: Self-pay | Admitting: Oncology

## 2014-07-31 ENCOUNTER — Ambulatory Visit (HOSPITAL_BASED_OUTPATIENT_CLINIC_OR_DEPARTMENT_OTHER): Payer: Medicare Other | Admitting: Oncology

## 2014-07-31 ENCOUNTER — Other Ambulatory Visit: Payer: Self-pay | Admitting: *Deleted

## 2014-07-31 ENCOUNTER — Ambulatory Visit: Payer: Medicare Other | Admitting: Nutrition

## 2014-07-31 ENCOUNTER — Encounter: Payer: Self-pay | Admitting: *Deleted

## 2014-07-31 ENCOUNTER — Ambulatory Visit (HOSPITAL_BASED_OUTPATIENT_CLINIC_OR_DEPARTMENT_OTHER): Payer: Medicare Other

## 2014-07-31 ENCOUNTER — Other Ambulatory Visit (HOSPITAL_BASED_OUTPATIENT_CLINIC_OR_DEPARTMENT_OTHER): Payer: Medicare Other

## 2014-07-31 ENCOUNTER — Other Ambulatory Visit: Payer: Self-pay | Admitting: Nurse Practitioner

## 2014-07-31 ENCOUNTER — Telehealth: Payer: Self-pay | Admitting: *Deleted

## 2014-07-31 VITALS — BP 179/83 | HR 66 | Temp 98.6°F | Resp 18 | Ht 62.0 in | Wt 164.9 lb

## 2014-07-31 DIAGNOSIS — C189 Malignant neoplasm of colon, unspecified: Secondary | ICD-10-CM

## 2014-07-31 DIAGNOSIS — C180 Malignant neoplasm of cecum: Secondary | ICD-10-CM

## 2014-07-31 DIAGNOSIS — Z5111 Encounter for antineoplastic chemotherapy: Secondary | ICD-10-CM | POA: Diagnosis not present

## 2014-07-31 LAB — COMPREHENSIVE METABOLIC PANEL (CC13)
ALT: 22 U/L (ref 0–55)
AST: 27 U/L (ref 5–34)
Albumin: 3.5 g/dL (ref 3.5–5.0)
Alkaline Phosphatase: 140 U/L (ref 40–150)
Anion Gap: 10 mEq/L (ref 3–11)
BILIRUBIN TOTAL: 0.37 mg/dL (ref 0.20–1.20)
BUN: 12.5 mg/dL (ref 7.0–26.0)
CO2: 28 mEq/L (ref 22–29)
Calcium: 8.9 mg/dL (ref 8.4–10.4)
Chloride: 106 mEq/L (ref 98–109)
Creatinine: 1.2 mg/dL — ABNORMAL HIGH (ref 0.6–1.1)
EGFR: 55 mL/min/{1.73_m2} — ABNORMAL LOW (ref >90)
GLUCOSE: 107 mg/dL (ref 70–140)
Potassium: 3.1 mEq/L — ABNORMAL LOW (ref 3.5–5.1)
Sodium: 145 mEq/L (ref 136–145)
Total Protein: 6.5 g/dL (ref 6.4–8.3)

## 2014-07-31 LAB — CEA: CEA: 4.6 ng/mL (ref 0.0–5.0)

## 2014-07-31 LAB — CBC WITH DIFFERENTIAL/PLATELET
BASO%: 0.5 % (ref 0.0–2.0)
BASOS ABS: 0.1 10*3/uL (ref 0.0–0.1)
EOS ABS: 0 10*3/uL (ref 0.0–0.5)
EOS%: 0.2 % (ref 0.0–7.0)
HCT: 39.6 % (ref 34.8–46.6)
HEMOGLOBIN: 12.7 g/dL (ref 11.6–15.9)
LYMPH#: 1.4 10*3/uL (ref 0.9–3.3)
LYMPH%: 10.6 % — ABNORMAL LOW (ref 14.0–49.7)
MCH: 31.7 pg (ref 25.1–34.0)
MCHC: 32.2 g/dL (ref 31.5–36.0)
MCV: 98.4 fL (ref 79.5–101.0)
MONO#: 0.7 10*3/uL (ref 0.1–0.9)
MONO%: 5.7 % (ref 0.0–14.0)
NEUT#: 10.7 10*3/uL — ABNORMAL HIGH (ref 1.5–6.5)
NEUT%: 83 % — ABNORMAL HIGH (ref 38.4–76.8)
PLATELETS: 80 10*3/uL — AB (ref 145–400)
RBC: 4.03 10*6/uL (ref 3.70–5.45)
RDW: 16.9 % — AB (ref 11.2–14.5)
WBC: 12.9 10*3/uL — ABNORMAL HIGH (ref 3.9–10.3)

## 2014-07-31 MED ORDER — DEXTROSE 5 % IV SOLN
400.0000 mg/m2 | Freq: Once | INTRAVENOUS | Status: AC
  Administered 2014-07-31: 744 mg via INTRAVENOUS
  Filled 2014-07-31: qty 37.2

## 2014-07-31 MED ORDER — OXALIPLATIN CHEMO INJECTION 100 MG/20ML
65.0000 mg/m2 | Freq: Once | INTRAVENOUS | Status: AC
  Administered 2014-07-31: 120 mg via INTRAVENOUS
  Filled 2014-07-31: qty 24

## 2014-07-31 MED ORDER — ONDANSETRON 8 MG PO TBDP: 8.0000 mg | ORAL_TABLET | Freq: Three times a day (TID) | ORAL | Status: DC | PRN

## 2014-07-31 MED ORDER — DEXTROSE 5 % IV SOLN
Freq: Once | INTRAVENOUS | Status: AC
  Administered 2014-07-31: 10:00:00 via INTRAVENOUS

## 2014-07-31 MED ORDER — FLUOROURACIL CHEMO INJECTION 5 GM/100ML
2400.0000 mg/m2 | INTRAVENOUS | Status: DC
  Administered 2014-07-31: 4450 mg via INTRAVENOUS
  Filled 2014-07-31: qty 89

## 2014-07-31 MED ORDER — PALONOSETRON HCL INJECTION 0.25 MG/5ML
INTRAVENOUS | Status: AC
  Filled 2014-07-31: qty 5

## 2014-07-31 MED ORDER — FLUOROURACIL CHEMO INJECTION 2.5 GM/50ML
400.0000 mg/m2 | Freq: Once | INTRAVENOUS | Status: AC
Start: 2014-07-31 — End: 2014-07-31
  Administered 2014-07-31: 750 mg via INTRAVENOUS
  Filled 2014-07-31: qty 15

## 2014-07-31 MED ORDER — SODIUM CHLORIDE 0.9 % IV SOLN
Freq: Once | INTRAVENOUS | Status: AC
  Administered 2014-07-31: 10:00:00 via INTRAVENOUS
  Filled 2014-07-31: qty 5

## 2014-07-31 MED ORDER — PALONOSETRON HCL INJECTION 0.25 MG/5ML
0.2500 mg | Freq: Once | INTRAVENOUS | Status: AC
  Administered 2014-07-31: 0.25 mg via INTRAVENOUS

## 2014-07-31 NOTE — Telephone Encounter (Signed)
Per Ned Card, NP; notified pt that potassium is low and NP wants her to increase her potassium to twice a day.  Pt verbalized understanding and states she does not need re-fill "I have enough potassium right now and I will take twice a day"

## 2014-07-31 NOTE — Progress Notes (Signed)
Ok to treat per Dr Benay Spice.  Folfox to be dose reduced.

## 2014-07-31 NOTE — [facility] Oncology Navigator Note (Signed)
Oncology Nurse Navigator Documentation  Oncology Nurse Navigator Flowsheets 07/31/2014  Navigator Encounter Type Treatment- 1 month F/U  Patient Visit Type Medonc  Treatment Phase Final Chemo TX  Barriers/Navigation Needs No barriers at this time  Education F/U labs and CT scan/ colonoscopy due in December. PAC flush in 6 weeks and arrange with surgeon to remove port if desired.   Time Spent with Patient 30  Celebrated patient "ringing the bell" today for her last chemo treatment. Several family members were present.

## 2014-07-31 NOTE — Patient Instructions (Signed)
Congratulations on your last chemo treatment. Return to office in 6 weeks with labs and port flush Follow up with Dr. [name] as scheduled Will order scans for December at next visit and your 1 year colonoscopy will also be due. Enjoy the summer!

## 2014-07-31 NOTE — Patient Instructions (Signed)
[facility] Discharge Instructions for Patients Receiving Chemotherapy  Today you received the following chemotherapy agents oxaliplatin/leucovorin/fluorouracil  To help prevent nausea and vomiting after your treatment, we encourage you to take your nausea medication as directed   If you develop nausea and vomiting that is not controlled by your nausea medication, call the clinic.   BELOW ARE SYMPTOMS THAT SHOULD BE REPORTED IMMEDIATELY:  *FEVER GREATER THAN 100.5 F  *CHILLS WITH OR WITHOUT FEVER  NAUSEA AND VOMITING THAT IS NOT CONTROLLED WITH YOUR NAUSEA MEDICATION  *UNUSUAL SHORTNESS OF BREATH  *UNUSUAL BRUISING OR BLEEDING  TENDERNESS IN MOUTH AND THROAT WITH OR WITHOUT PRESENCE OF ULCERS  *URINARY PROBLEMS  *BOWEL PROBLEMS  UNUSUAL RASH Items with * indicate a potential emergency and should be followed up as soon as possible.  Feel free to call the clinic you have any questions or concerns. The clinic phone number is [phone].  Please show the [facility] at check-in to the Emergency Department and triage nurse.

## 2014-07-31 NOTE — Progress Notes (Signed)
  [facility] OFFICE PROGRESS NOTE   Diagnosis: Colon cancer  INTERVAL HISTORY:   She returns as scheduled. She completed another cycle of FOLFOX 07/17/2014. She tolerated chemotherapy well. She reports mild numbness in the left fingertips. This does not interfere with activity. She had an episode of nausea and vomiting yesterday. She does not have consistent nausea. No mouth sores.  Objective:  Vital signs in last 24 hours:  Blood pressure 179/83, pulse 66, temperature 98.6 F (37 C), temperature source Oral, resp. rate 18, height _0  (1.575 m), weight 164 lb 14.4 oz (74.798 kg), SpO2 100 %.    HEENT: Hyperpigmentation of the buccal mucosa, no thrush or ulcers Lymphatics: No cervical, supraclavicular, axillary, or inguinal nodes. Prominent bilateral x-ray fat pads. Resp: Lungs clear bilaterally Cardio: Regular rate and rhythm GI: No hepatomegaly, nontender, no mass Vascular: No leg edema Neuro: Mild decrease in vibratory sense at the fingertips  Skin: Dryness with hyperpigmentation of the hands   Portacath/PICC-without erythema  Lab Results:  Lab Results  Component Value Date   WBC 12.9* 07/31/2014   HGB 12.7 07/31/2014   HCT 39.6 07/31/2014   MCV 98.4 07/31/2014   PLT 80* 07/31/2014   NEUTROABS 10.7* 07/31/2014      Medications: I have reviewed the patient's current medications.  Assessment/Plan: 1. 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  CTs of the chest, abdomen, and pelvis on 01/10/2014 with a cecal mass, nonspecific small liver lesions, and nonspecific lung nodules felt to most likely represent mucoid impaction  Cycle 1 FOLFOX 02/20/2014  Cycle 2 FOLFOX 03/06/2014  Cycle 3 FOLFOX 03/20/2014  Cycle 4 FOLFOX 04/03/2014  Cycle 5 FOLFOX held on 04/17/2014 due to  neutropenia  Cycle 5 FOLFOX 04/24/2014 with Neulasta support  Cycle 6 FOLFOX 05/08/2014  Cycle 7 FOLFOX 05/22/2014 with Neulasta support  Cycle 8 FOLFOX 06/05/2014 (oxaliplatin held secondary to thrombocytopenia)  Cycle 9 FOLFOX 06/19/2014 with Neulasta support  Cycle 10 FOLFOX 07/03/2014 (oxaliplatin held secondary to thrombocytopenia)  Cycle 11 FOLFOX 07/17/2014  2. History of Iron deficiency anemia 3. Family history of breast cancer 4. Port-A-Cath placement 02/18/2014 5. Delayed nausea following cycle 1 FOLFOX. Aloxi and Emend added beginning with cycle 2. Prophylactic Decadron added beginning with cycle 3. 6. Neutropenia following cycle 4 FOLFOX. Neulasta was added with cycle 5. 7. Hypokalemia 04/17/2014. Question secondary to diarrhea. Potassium supplement initiated. 8. Left upper arm pain. Negative venous Doppler 03/07/2014. She was referred to orthopedics. 9. Thrombocytopenia secondary to chemotherapy 10. Mild oxaliplatin neuropathy 11. Hypertension    Disposition:  Ms. Milhouse will complete the final cycle of adjuvant chemotherapy today. She has mild thrombocytopenia. We will dose reduce the oxaliplatin with this cycle. She will contact us for spontaneous bleeding or bruising. She will return for a CBC in 2 weeks and an office visit in 6 weeks.  She will be scheduled for restaging CTs in December 2016.  [name], MD  07/31/2014  9:07 AM

## 2014-07-31 NOTE — Progress Notes (Signed)
Nutrition follow-up completed with patient during final treatment for colon cancer. Weight decreased and was documented as 164.9 pounds June 30, down from 167.7 pounds June 16. Patient reports she tries to eat well. She has avoided fried foods. Patient has no nutrition concerns.  Nutrition diagnosis: Inadequate oral intake continues.  Intervention: Educated patient to continue strategies for increasing calories and protein to promote weight maintenance. Recommended patient contact me if appetite and oral intake don't improve and weight loss continues after treatment. Teach back method used.  Contact information was given.  Monitoring, evaluation, goals: Patient will improve oral intake to promote weight maintenance.  **Disclaimer: This note was dictated with voice recognition software. Similar sounding words can inadvertently be transcribed and this note may contain transcription errors which may not have been corrected upon publication of note.**

## 2014-08-02 ENCOUNTER — Ambulatory Visit (HOSPITAL_BASED_OUTPATIENT_CLINIC_OR_DEPARTMENT_OTHER): Payer: Medicare Other

## 2014-08-02 VITALS — BP 171/86 | HR 63 | Temp 98.7°F

## 2014-08-02 DIAGNOSIS — Z5189 Encounter for other specified aftercare: Secondary | ICD-10-CM | POA: Diagnosis not present

## 2014-08-02 DIAGNOSIS — C180 Malignant neoplasm of cecum: Secondary | ICD-10-CM

## 2014-08-02 DIAGNOSIS — C189 Malignant neoplasm of colon, unspecified: Secondary | ICD-10-CM

## 2014-08-02 MED ORDER — PEGFILGRASTIM INJECTION 6 MG/0.6ML ~~LOC~~
6.0000 mg | PREFILLED_SYRINGE | Freq: Once | SUBCUTANEOUS | Status: AC
  Administered 2014-08-02: 6 mg via SUBCUTANEOUS

## 2014-08-02 MED ORDER — HEPARIN SOD (PORK) LOCK FLUSH 100 UNIT/ML IV SOLN
500.0000 [IU] | Freq: Once | INTRAVENOUS | Status: AC | PRN
  Administered 2014-08-02: 500 [IU]
  Filled 2014-08-02: qty 5

## 2014-08-02 MED ORDER — SODIUM CHLORIDE 0.9 % IJ SOLN
10.0000 mL | INTRAMUSCULAR | Status: DC | PRN
  Administered 2014-08-02: 10 mL
  Filled 2014-08-02: qty 10

## 2014-08-14 ENCOUNTER — Other Ambulatory Visit (HOSPITAL_BASED_OUTPATIENT_CLINIC_OR_DEPARTMENT_OTHER): Payer: Medicare Other

## 2014-08-14 ENCOUNTER — Ambulatory Visit (HOSPITAL_BASED_OUTPATIENT_CLINIC_OR_DEPARTMENT_OTHER): Payer: Medicare Other

## 2014-08-14 VITALS — BP 180/74 | HR 64 | Temp 98.5°F | Resp 16

## 2014-08-14 DIAGNOSIS — D6959 Other secondary thrombocytopenia: Secondary | ICD-10-CM

## 2014-08-14 DIAGNOSIS — C180 Malignant neoplasm of cecum: Secondary | ICD-10-CM

## 2014-08-14 DIAGNOSIS — E876 Hypokalemia: Secondary | ICD-10-CM

## 2014-08-14 DIAGNOSIS — Z452 Encounter for adjustment and management of vascular access device: Secondary | ICD-10-CM

## 2014-08-14 DIAGNOSIS — Z95828 Presence of other vascular implants and grafts: Secondary | ICD-10-CM

## 2014-08-14 DIAGNOSIS — C189 Malignant neoplasm of colon, unspecified: Secondary | ICD-10-CM

## 2014-08-14 LAB — CBC WITH DIFFERENTIAL/PLATELET
BASO%: 0.2 % (ref 0.0–2.0)
BASOS ABS: 0 10*3/uL (ref 0.0–0.1)
EOS ABS: 0 10*3/uL (ref 0.0–0.5)
EOS%: 0.2 % (ref 0.0–7.0)
HEMATOCRIT: 38.7 % (ref 34.8–46.6)
HGB: 12.5 g/dL (ref 11.6–15.9)
LYMPH%: 15.8 % (ref 14.0–49.7)
MCH: 32.1 pg (ref 25.1–34.0)
MCHC: 32.3 g/dL (ref 31.5–36.0)
MCV: 99.2 fL (ref 79.5–101.0)
MONO#: 0.6 10*3/uL (ref 0.1–0.9)
MONO%: 10.2 % (ref 0.0–14.0)
NEUT%: 73.6 % (ref 38.4–76.8)
NEUTROS ABS: 4.6 10*3/uL (ref 1.5–6.5)
NRBC: 1 % — AB (ref 0–0)
Platelets: 59 10*3/uL — ABNORMAL LOW (ref 145–400)
RBC: 3.9 10*6/uL (ref 3.70–5.45)
RDW: 16.1 % — ABNORMAL HIGH (ref 11.2–14.5)
WBC: 6.3 10*3/uL (ref 3.9–10.3)
lymph#: 1 10*3/uL (ref 0.9–3.3)

## 2014-08-14 MED ORDER — HEPARIN SOD (PORK) LOCK FLUSH 100 UNIT/ML IV SOLN
500.0000 [IU] | Freq: Once | INTRAVENOUS | Status: AC
  Administered 2014-08-14: 500 [IU] via INTRAVENOUS
  Filled 2014-08-14: qty 5

## 2014-08-14 MED ORDER — SODIUM CHLORIDE 0.9 % IJ SOLN
10.0000 mL | INTRAMUSCULAR | Status: DC | PRN
  Administered 2014-08-14: 10 mL via INTRAVENOUS
  Filled 2014-08-14: qty 10

## 2014-08-14 NOTE — Patient Instructions (Signed)

## 2014-09-11 ENCOUNTER — Other Ambulatory Visit (HOSPITAL_BASED_OUTPATIENT_CLINIC_OR_DEPARTMENT_OTHER): Payer: Medicare Other

## 2014-09-11 ENCOUNTER — Telehealth: Payer: Self-pay | Admitting: Nurse Practitioner

## 2014-09-11 ENCOUNTER — Ambulatory Visit (HOSPITAL_BASED_OUTPATIENT_CLINIC_OR_DEPARTMENT_OTHER): Payer: Medicare Other | Admitting: Nurse Practitioner

## 2014-09-11 ENCOUNTER — Ambulatory Visit (HOSPITAL_BASED_OUTPATIENT_CLINIC_OR_DEPARTMENT_OTHER): Payer: Medicare Other

## 2014-09-11 VITALS — BP 178/81 | HR 62 | Temp 98.8°F | Resp 18 | Ht 62.0 in | Wt 165.2 lb

## 2014-09-11 DIAGNOSIS — C189 Malignant neoplasm of colon, unspecified: Secondary | ICD-10-CM

## 2014-09-11 DIAGNOSIS — D6959 Other secondary thrombocytopenia: Secondary | ICD-10-CM

## 2014-09-11 DIAGNOSIS — C180 Malignant neoplasm of cecum: Secondary | ICD-10-CM

## 2014-09-11 DIAGNOSIS — E876 Hypokalemia: Secondary | ICD-10-CM

## 2014-09-11 DIAGNOSIS — G620 Drug-induced polyneuropathy: Secondary | ICD-10-CM

## 2014-09-11 DIAGNOSIS — I10 Essential (primary) hypertension: Secondary | ICD-10-CM

## 2014-09-11 DIAGNOSIS — Z452 Encounter for adjustment and management of vascular access device: Secondary | ICD-10-CM

## 2014-09-11 DIAGNOSIS — Z95828 Presence of other vascular implants and grafts: Secondary | ICD-10-CM

## 2014-09-11 LAB — CBC WITH DIFFERENTIAL/PLATELET
BASO%: 0.8 % (ref 0.0–2.0)
BASOS ABS: 0 10*3/uL (ref 0.0–0.1)
EOS ABS: 0.1 10*3/uL (ref 0.0–0.5)
EOS%: 2.2 % (ref 0.0–7.0)
HEMATOCRIT: 39.4 % (ref 34.8–46.6)
HEMOGLOBIN: 12.7 g/dL (ref 11.6–15.9)
LYMPH#: 0.9 10*3/uL (ref 0.9–3.3)
LYMPH%: 20.8 % (ref 14.0–49.7)
MCH: 31.9 pg (ref 25.1–34.0)
MCHC: 32.3 g/dL (ref 31.5–36.0)
MCV: 98.8 fL (ref 79.5–101.0)
MONO#: 0.5 10*3/uL (ref 0.1–0.9)
MONO%: 10.4 % (ref 0.0–14.0)
NEUT%: 65.8 % (ref 38.4–76.8)
NEUTROS ABS: 3 10*3/uL (ref 1.5–6.5)
Platelets: 172 10*3/uL (ref 145–400)
RBC: 3.99 10*6/uL (ref 3.70–5.45)
RDW: 14.5 % (ref 11.2–14.5)
WBC: 4.5 10*3/uL (ref 3.9–10.3)

## 2014-09-11 LAB — BASIC METABOLIC PANEL (CC13)
Anion Gap: 7 mEq/L (ref 3–11)
BUN: 12.7 mg/dL (ref 7.0–26.0)
CHLORIDE: 106 meq/L (ref 98–109)
CO2: 29 mEq/L (ref 22–29)
CREATININE: 1.1 mg/dL (ref 0.6–1.1)
Calcium: 9 mg/dL (ref 8.4–10.4)
EGFR: 59 mL/min/{1.73_m2} — ABNORMAL LOW (ref >90)
GLUCOSE: 110 mg/dL (ref 70–140)
Potassium: 3.6 mEq/L (ref 3.5–5.1)
Sodium: 142 mEq/L (ref 136–145)

## 2014-09-11 LAB — CEA: CEA: 2.3 ng/mL (ref 0.0–5.0)

## 2014-09-11 MED ORDER — LORAZEPAM 0.5 MG PO TABS: 0.5000 mg | ORAL_TABLET | Freq: Three times a day (TID) | ORAL | Status: DC | PRN

## 2014-09-11 MED ORDER — SODIUM CHLORIDE 0.9 % IJ SOLN
10.0000 mL | INTRAMUSCULAR | Status: DC | PRN
  Administered 2014-09-11: 10 mL via INTRAVENOUS
  Filled 2014-09-11: qty 10

## 2014-09-11 MED ORDER — HEPARIN SOD (PORK) LOCK FLUSH 100 UNIT/ML IV SOLN
500.0000 [IU] | Freq: Once | INTRAVENOUS | Status: AC
  Administered 2014-09-11: 500 [IU] via INTRAVENOUS
  Filled 2014-09-11: qty 5

## 2014-09-11 NOTE — Telephone Encounter (Signed)
per pof to sch pt appt-gave pt copy of avs °

## 2014-09-11 NOTE — Progress Notes (Signed)
[facility] OFFICE PROGRESS NOTE   Diagnosis:  Colon cancer  INTERVAL HISTORY:   Ms. Boyers returns as scheduled. She completed the 12th and final cycle of FOLFOX on 07/31/2014. Overall she feels well. No nausea or vomiting. Bowels moving regularly. She notes that her stools are "dark" since increasing oral iron from once a day to twice a day. No diarrhea. She has mild numbness in the fingertips. She continues to have left arm pain. She reports being diagnosed with "bursitis". She is taking hydrocodone as needed.  Objective:  Vital signs in last 24 hours:  Blood pressure 178/81, pulse 62, temperature 98.8 F (37.1 C), temperature source Oral, resp. rate 18, height _0  (1.575 m), weight 165 lb 3.2 oz (74.934 kg), SpO2 100 %.    HEENT: No thrush or ulcers. Lymphatics: No palpable cervical, supra clavicular, axillary or inguinal lymph nodes. Resp: Lungs clear bilaterally. Cardio: Regular rate and rhythm. GI: Abdomen soft and nontender. No hepatomegaly. No mass. Vascular: No leg edema. Calves soft and nontender. Neuro: Vibratory sense intact to mildly decreased over the fingertips per tuning fork exam.  Skin: No rash. Port-A-Cath without erythema.    Lab Results:  Lab Results  Component Value Date   WBC 4.5 09/11/2014   HGB 12.7 09/11/2014   HCT 39.4 09/11/2014   MCV 98.8 09/11/2014   PLT 172 09/11/2014   NEUTROABS 3.0 09/11/2014    Imaging:  No results found.  Medications: I have reviewed the patient's current medications.  Assessment/Plan: 1. 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  CTs of the chest, abdomen, and pelvis on 01/10/2014 with a cecal mass, nonspecific small liver lesions, and nonspecific lung nodules felt to most likely represent mucoid impaction  Cycle 1 FOLFOX  02/20/2014  Cycle 2 FOLFOX 03/06/2014  Cycle 3 FOLFOX 03/20/2014  Cycle 4 FOLFOX 04/03/2014  Cycle 5 FOLFOX held on 04/17/2014 due to neutropenia  Cycle 5 FOLFOX 04/24/2014 with Neulasta support  Cycle 6 FOLFOX 05/08/2014  Cycle 7 FOLFOX 05/22/2014 with Neulasta support  Cycle 8 FOLFOX 06/05/2014 (oxaliplatin held secondary to thrombocytopenia)  Cycle 9 FOLFOX 06/19/2014 with Neulasta support  Cycle 10 FOLFOX 07/03/2014 (oxaliplatin held secondary to thrombocytopenia)  Cycle 11 FOLFOX 07/17/2014  Cycle 12 FOLFOX 07/31/2014 (oxaliplatin dose reduced due to thrombocytopenia  2. History of Iron deficiency anemia 3. Family history of breast cancer 4. Port-A-Cath placement 02/18/2014 5. Delayed nausea following cycle 1 FOLFOX. Aloxi and Emend added beginning with cycle 2. Prophylactic Decadron added beginning with cycle 3. 6. Neutropenia following cycle 4 FOLFOX. Neulasta was added with cycle 5. 7. Hypokalemia 04/17/2014. Question secondary to diarrhea. Potassium supplement initiated. 8. Left upper arm pain. Negative venous Doppler 03/07/2014. She was referred to orthopedics. 9. Thrombocytopenia secondary to chemotherapy 10. Mild oxaliplatin neuropathy 11. Hypertension   Disposition: Ms. Cobos appears stable. She has completed the course of adjuvant chemotherapy. We obtained a CEA today and will follow-up on that result. The plan is for restaging CT scans in December 2016. We will see her approximately 1 week following the CT scans to review the results. Port-A-Cath to remain in place until the CT scan. We will continue to flush the Port-A-Cath every 6 weeks. She will contact the office prior to her next visit with any problems.  She is currently on potassium. The potassium today was in low normal range. She will  continue at the current dose and follow-up with Dr. Jeanie Cooks regarding the need to continue potassium.  Plan reviewed with Dr. Benay Spice.    Ned Card ANP/GNP-BC    09/11/2014  10:02 AM

## 2014-09-11 NOTE — Telephone Encounter (Addendum)
Called in prescription for ativan  0.5mg  tab every 8 hours needed 30 tabs no refills

## 2014-09-11 NOTE — Patient Instructions (Signed)
Abscesso  (Abscess)  O abscesso  uma rea infectada que contm pus e detritos.Ele pode ocorrer em praticamente qualquer parte do corpo. O abscesso  tambm conhecido como furnculo. CAUSAS  O abscesso ocorre quando o tecido fica infectado. Isto pode ocorrer devido ao bloqueio de glndulas de suor ou de leo,  infeco de folculos capilares ou a um pequeno ferimento na pele. Conforme o corpo tenta lutar contra a infeco, o pus se acumula na rea e produz presso sob a pele. Essa presso causa dor. As pessoas com um sistema imune enfraquecido tm dificuldade de lutar contra as infeces e acabam tendo abscessos com mais frequncia.  SINTOMAS  Geralmente, o abscesso se desenvolve na pele e se torna uma massa dolorosa vermelha, quente e macia. Se o abscesso  formado abaixo da pele, voc pode sentir uma rea leve mvel sob a pele. Alguns abscessos se abrem (ruptura) por si prprios, mas a maioria continuar a piorar se no houver tratamento. A infeco pode se espalhar profundamente no corpo e, eventualmente, na corrente sangunea, fazendo-o ficar doente.  DIAGNSTICO  O mdico tomar seu histrico e realizar um exame fsico. Tambm pode ser tirada Ellard Artis de fludo do abscesso para determinar o que est causando a infeco.  TRATAMENTO  Seu mdico pode prescrever antibiticos para lutar contra a infeco. Contudo, tomar somente os antibiticos geralmente no cura o abscesso. Seu mdico pode precisar fazer um pequeno corte (inciso) no abscesso para drenar o pus. Em alguns casos, uma gaze  colocada no abscesso para reduzir a dor e para continuar a drenar a rea.  INSTRUES PARA TRATAMENTO DOMICILIAR   Somente tome medicamentos de venda livre ou prescritos contra dor, desconforto ou febre conforme orientado por seu mdico.  Se estiver tomando antibiticos, tome-os conforme orientado. Termine de tom-los mesmo caso j se sinta melhor.  Se for [place] gaze, siga as orientaes de seu mdico  para fazer as trocas.  Evite espalhar a infeco:  Mantenha a drenagem do abscesso coberta com um curativo.  Lave bem suas mos.  No compartilhe itens de cuidado pessoal, toalhas ou banheiras de hidromassagem com outras pessoas.  Evite o contato da pele com outras pessoas.  Mantenha sua pele e roupas limpas em torno do abscesso.  Faa todas as consultas, conforme orientado pelo seu mdico. PROCURE UM MDICO SE:   Tiver aumento da dor, inchao, vermelhido, escorrimento de fludo ou sangramento.  Tiver dores musculares, calafrios ou um mal-estar geral.  Tiver febre. CERTIFIQUE-SE DE:   Compreender estas instrues.  Observar as suas condies.  Procurar um mdico imediatamente se no se sentir bem ou piorar. Document Released: 01/17/2005 Document Revised: 07/19/2011 [facility] Patient Information 2015 [place], [place]. This information is not intended to replace advice given to you by your health care provider. Make sure you discuss any questions you have with your health care provider.

## 2014-09-12 ENCOUNTER — Telehealth: Payer: Self-pay | Admitting: *Deleted

## 2014-09-12 NOTE — Telephone Encounter (Signed)
Per Dr. Benay Spice; left voice message that cea is normal; call office if any questions.

## 2014-09-12 NOTE — Telephone Encounter (Signed)
-----   Message from Ladell Pier, MD sent at 09/11/2014  9:26 PM EDT ----- Please call patient, cea is normal

## 2014-09-18 ENCOUNTER — Other Ambulatory Visit: Payer: Self-pay | Admitting: *Deleted

## 2014-09-18 DIAGNOSIS — C189 Malignant neoplasm of colon, unspecified: Secondary | ICD-10-CM

## 2014-09-18 MED ORDER — ONDANSETRON 8 MG PO TBDP: 8.0000 mg | ORAL_TABLET | Freq: Three times a day (TID) | ORAL | Status: DC | PRN

## 2014-10-23 ENCOUNTER — Ambulatory Visit (HOSPITAL_BASED_OUTPATIENT_CLINIC_OR_DEPARTMENT_OTHER): Payer: Medicare Other

## 2014-10-23 DIAGNOSIS — Z452 Encounter for adjustment and management of vascular access device: Secondary | ICD-10-CM | POA: Diagnosis not present

## 2014-10-23 DIAGNOSIS — C180 Malignant neoplasm of cecum: Secondary | ICD-10-CM

## 2014-10-23 DIAGNOSIS — Z95828 Presence of other vascular implants and grafts: Secondary | ICD-10-CM

## 2014-10-23 MED ORDER — HEPARIN SOD (PORK) LOCK FLUSH 100 UNIT/ML IV SOLN
500.0000 [IU] | Freq: Once | INTRAVENOUS | Status: AC
  Administered 2014-10-23: 500 [IU] via INTRAVENOUS
  Filled 2014-10-23: qty 5

## 2014-10-23 MED ORDER — SODIUM CHLORIDE 0.9 % IJ SOLN
10.0000 mL | INTRAMUSCULAR | Status: DC | PRN
  Administered 2014-10-23: 10 mL via INTRAVENOUS
  Filled 2014-10-23: qty 10

## 2014-10-23 NOTE — Patient Instructions (Signed)

## 2014-10-30 ENCOUNTER — Other Ambulatory Visit: Payer: Self-pay | Admitting: *Deleted

## 2014-10-30 DIAGNOSIS — C189 Malignant neoplasm of colon, unspecified: Secondary | ICD-10-CM

## 2014-10-30 MED ORDER — ONDANSETRON 8 MG PO TBDP: 8.0000 mg | ORAL_TABLET | Freq: Three times a day (TID) | ORAL | Status: DC | PRN

## 2014-11-20 ENCOUNTER — Telehealth: Payer: Self-pay | Admitting: *Deleted

## 2014-11-20 NOTE — Telephone Encounter (Signed)
Oncology Nurse Navigator Documentation  Oncology Nurse Navigator Flowsheets 11/20/2014  Navigator Encounter Type 3 month;Telephone  Treatment Phase Treatment completed  Barriers/Navigation Needs No barriers at this time  Interventions None required  Time Spent with Patient 10  Reports feeling well. Still some neuropathy in feet, but getting better. Reviewed her appointments with her.

## 2014-12-05 ENCOUNTER — Ambulatory Visit (HOSPITAL_BASED_OUTPATIENT_CLINIC_OR_DEPARTMENT_OTHER): Payer: Medicare Other

## 2014-12-05 DIAGNOSIS — Z95828 Presence of other vascular implants and grafts: Secondary | ICD-10-CM

## 2014-12-05 DIAGNOSIS — Z452 Encounter for adjustment and management of vascular access device: Secondary | ICD-10-CM | POA: Diagnosis not present

## 2014-12-05 DIAGNOSIS — C180 Malignant neoplasm of cecum: Secondary | ICD-10-CM | POA: Diagnosis not present

## 2014-12-05 MED ORDER — SODIUM CHLORIDE 0.9 % IJ SOLN
10.0000 mL | INTRAMUSCULAR | Status: DC | PRN
  Administered 2014-12-05: 10 mL via INTRAVENOUS
  Filled 2014-12-05: qty 10

## 2014-12-05 MED ORDER — HEPARIN SOD (PORK) LOCK FLUSH 100 UNIT/ML IV SOLN
500.0000 [IU] | Freq: Once | INTRAVENOUS | Status: AC
  Administered 2014-12-05: 500 [IU] via INTRAVENOUS
  Filled 2014-12-05: qty 5

## 2014-12-05 NOTE — Patient Instructions (Signed)

## 2014-12-17 ENCOUNTER — Other Ambulatory Visit: Payer: Self-pay | Admitting: *Deleted

## 2014-12-17 DIAGNOSIS — C189 Malignant neoplasm of colon, unspecified: Secondary | ICD-10-CM

## 2014-12-17 MED ORDER — ONDANSETRON 8 MG PO TBDP: 8.0000 mg | ORAL_TABLET | Freq: Three times a day (TID) | ORAL | Status: DC | PRN

## 2014-12-19 ENCOUNTER — Other Ambulatory Visit: Payer: Self-pay | Admitting: *Deleted

## 2015-01-05 ENCOUNTER — Other Ambulatory Visit (HOSPITAL_BASED_OUTPATIENT_CLINIC_OR_DEPARTMENT_OTHER): Payer: Medicare Other

## 2015-01-05 ENCOUNTER — Ambulatory Visit (HOSPITAL_COMMUNITY)
Admission: RE | Admit: 2015-01-05 | Discharge: 2015-01-05 | Disposition: A | Discharge status: Home / Self Care | Payer: Medicare Other | Source: Ambulatory Visit | Attending: Nurse Practitioner | Admitting: Nurse Practitioner

## 2015-01-05 ENCOUNTER — Encounter (HOSPITAL_COMMUNITY): Payer: Self-pay

## 2015-01-05 DIAGNOSIS — E876 Hypokalemia: Secondary | ICD-10-CM

## 2015-01-05 DIAGNOSIS — D6959 Other secondary thrombocytopenia: Secondary | ICD-10-CM

## 2015-01-05 DIAGNOSIS — K432 Incisional hernia without obstruction or gangrene: Secondary | ICD-10-CM | POA: Insufficient documentation

## 2015-01-05 DIAGNOSIS — C189 Malignant neoplasm of colon, unspecified: Secondary | ICD-10-CM | POA: Insufficient documentation

## 2015-01-05 DIAGNOSIS — Z9049 Acquired absence of other specified parts of digestive tract: Secondary | ICD-10-CM | POA: Insufficient documentation

## 2015-01-05 DIAGNOSIS — K769 Liver disease, unspecified: Secondary | ICD-10-CM | POA: Diagnosis not present

## 2015-01-05 DIAGNOSIS — Z980 Intestinal bypass and anastomosis status: Secondary | ICD-10-CM | POA: Diagnosis not present

## 2015-01-05 DIAGNOSIS — C180 Malignant neoplasm of cecum: Secondary | ICD-10-CM | POA: Diagnosis not present

## 2015-01-05 DIAGNOSIS — I8290 Acute embolism and thrombosis of unspecified vein: Secondary | ICD-10-CM | POA: Diagnosis not present

## 2015-01-05 DIAGNOSIS — D739 Disease of spleen, unspecified: Secondary | ICD-10-CM | POA: Diagnosis not present

## 2015-01-05 DIAGNOSIS — E278 Other specified disorders of adrenal gland: Secondary | ICD-10-CM | POA: Insufficient documentation

## 2015-01-05 LAB — COMPREHENSIVE METABOLIC PANEL
ALT: 9 U/L (ref 0–55)
ANION GAP: 9 meq/L (ref 3–11)
AST: 16 U/L (ref 5–34)
Albumin: 4 g/dL (ref 3.5–5.0)
Alkaline Phosphatase: 108 U/L (ref 40–150)
BUN: 17 mg/dL (ref 7.0–26.0)
CHLORIDE: 108 meq/L (ref 98–109)
CO2: 24 meq/L (ref 22–29)
Calcium: 9.4 mg/dL (ref 8.4–10.4)
Creatinine: 1.3 mg/dL — ABNORMAL HIGH (ref 0.6–1.1)
EGFR: 51 mL/min/{1.73_m2} — AB (ref >90)
GLUCOSE: 90 mg/dL (ref 70–140)
POTASSIUM: 3.7 meq/L (ref 3.5–5.1)
SODIUM: 141 meq/L (ref 136–145)
Total Protein: 7.3 g/dL (ref 6.4–8.3)

## 2015-01-05 LAB — CBC WITH DIFFERENTIAL/PLATELET
BASO%: 0.7 % (ref 0.0–2.0)
BASOS ABS: 0 10*3/uL (ref 0.0–0.1)
EOS ABS: 0.1 10*3/uL (ref 0.0–0.5)
EOS%: 2.6 % (ref 0.0–7.0)
HCT: 42.8 % (ref 34.8–46.6)
HGB: 13.9 g/dL (ref 11.6–15.9)
LYMPH%: 30.3 % (ref 14.0–49.7)
MCH: 30.4 pg (ref 25.1–34.0)
MCHC: 32.5 g/dL (ref 31.5–36.0)
MCV: 93.5 fL (ref 79.5–101.0)
MONO#: 0.4 10*3/uL (ref 0.1–0.9)
MONO%: 8.6 % (ref 0.0–14.0)
NEUT#: 3 10*3/uL (ref 1.5–6.5)
NEUT%: 57.8 % (ref 38.4–76.8)
Platelets: 240 10*3/uL (ref 145–400)
RBC: 4.58 10*6/uL (ref 3.70–5.45)
RDW: 13.9 % (ref 11.2–14.5)
WBC: 5.2 10*3/uL (ref 3.9–10.3)
lymph#: 1.6 10*3/uL (ref 0.9–3.3)

## 2015-01-05 LAB — CEA: CEA: 2.2 ng/mL (ref 0.0–5.0)

## 2015-01-05 MED ORDER — IOHEXOL 300 MG/ML  SOLN
100.0000 mL | Freq: Once | INTRAMUSCULAR | Status: AC | PRN
  Administered 2015-01-05: 100 mL via INTRAVENOUS

## 2015-01-09 ENCOUNTER — Other Ambulatory Visit: Payer: Self-pay | Admitting: *Deleted

## 2015-01-12 ENCOUNTER — Telehealth: Payer: Self-pay | Admitting: *Deleted

## 2015-01-12 ENCOUNTER — Other Ambulatory Visit (HOSPITAL_BASED_OUTPATIENT_CLINIC_OR_DEPARTMENT_OTHER): Payer: Medicare Other

## 2015-01-12 ENCOUNTER — Ambulatory Visit: Payer: Medicare Other

## 2015-01-12 ENCOUNTER — Other Ambulatory Visit: Payer: Medicare Other

## 2015-01-12 ENCOUNTER — Telehealth: Payer: Self-pay | Admitting: Oncology

## 2015-01-12 ENCOUNTER — Ambulatory Visit (HOSPITAL_BASED_OUTPATIENT_CLINIC_OR_DEPARTMENT_OTHER): Payer: Medicare Other | Admitting: Oncology

## 2015-01-12 VITALS — BP 146/78 | HR 74 | Temp 99.1°F | Resp 18 | Ht 62.0 in | Wt 162.3 lb

## 2015-01-12 DIAGNOSIS — C182 Malignant neoplasm of ascending colon: Secondary | ICD-10-CM

## 2015-01-12 DIAGNOSIS — Z85038 Personal history of other malignant neoplasm of large intestine: Secondary | ICD-10-CM

## 2015-01-12 DIAGNOSIS — D509 Iron deficiency anemia, unspecified: Secondary | ICD-10-CM

## 2015-01-12 DIAGNOSIS — M25512 Pain in left shoulder: Secondary | ICD-10-CM

## 2015-01-12 DIAGNOSIS — C189 Malignant neoplasm of colon, unspecified: Secondary | ICD-10-CM

## 2015-01-12 DIAGNOSIS — D6959 Other secondary thrombocytopenia: Secondary | ICD-10-CM | POA: Diagnosis not present

## 2015-01-12 DIAGNOSIS — D696 Thrombocytopenia, unspecified: Secondary | ICD-10-CM | POA: Diagnosis not present

## 2015-01-12 DIAGNOSIS — Z95828 Presence of other vascular implants and grafts: Secondary | ICD-10-CM

## 2015-01-12 LAB — BASIC METABOLIC PANEL
Anion Gap: 7 mEq/L (ref 3–11)
BUN: 13 mg/dL (ref 7.0–26.0)
CALCIUM: 9.2 mg/dL (ref 8.4–10.4)
CHLORIDE: 107 meq/L (ref 98–109)
CO2: 28 mEq/L (ref 22–29)
CREATININE: 1.2 mg/dL — AB (ref 0.6–1.1)
EGFR: 55 mL/min/{1.73_m2} — ABNORMAL LOW (ref >90)
Glucose: 94 mg/dl (ref 70–140)
Potassium: 4.1 mEq/L (ref 3.5–5.1)
Sodium: 142 mEq/L (ref 136–145)

## 2015-01-12 MED ORDER — HYDROCODONE-ACETAMINOPHEN 5-325 MG PO TABS: 1.0000 | ORAL_TABLET | Freq: Three times a day (TID) | ORAL | Status: DC | PRN

## 2015-01-12 MED ORDER — ONDANSETRON 8 MG PO TBDP: 8.0000 mg | ORAL_TABLET | Freq: Three times a day (TID) | ORAL | Status: DC | PRN

## 2015-01-12 MED ORDER — HEPARIN SOD (PORK) LOCK FLUSH 100 UNIT/ML IV SOLN
500.0000 [IU] | Freq: Once | INTRAVENOUS | Status: DC
  Filled 2015-01-12: qty 5

## 2015-01-12 MED ORDER — SODIUM CHLORIDE 0.9 % IJ SOLN
10.0000 mL | INTRAMUSCULAR | Status: AC | PRN
  Filled 2015-01-12: qty 10

## 2015-01-12 NOTE — Telephone Encounter (Signed)
Patient called from [facility].  "I'm in the [facility] waiting for the medicine that goes under the tongue (zofran ODT), iron medicine and Hydrocodone refills."  Collaborative nurse notified.  Advised hydrocodone cannot be called in and she would have to come to [facility] to pick up hard copy.

## 2015-01-12 NOTE — Telephone Encounter (Signed)
Gave and printed appt sched and avs for pt for June lvm for Dr. [name] office

## 2015-01-12 NOTE — Progress Notes (Signed)
[place] OFFICE PROGRESS NOTE   Diagnosis: Colon cancer  INTERVAL HISTORY:   Ms. Leisner returns as scheduled. She has mild neuropathy symptoms in the hands and feet. This does not interfere with activity. She complains of discomfort at the left upper anterior chest. This discomfort has been present for the past few months.   Objective:  Vital signs in last 24 hours:  Blood pressure 146/78, pulse 74, temperature 99.1 F (37.3 C), temperature source Oral, resp. rate 18, height '5\' 2"'  (1.575 m), weight 162 lb 4.8 oz (73.619 kg), SpO2 100 %.    HEENT: Neck without mass Lymphatics: No cervical, supraclavicular, axillary, or inguinal nodes Resp: Lungs clear bilaterally Cardio: Regular rate and rhythm GI: No hepatomegaly, no mass, nontender Vascular: No leg edema. Mild edema at the left upper arm. There is venous engorgement and mild swelling at the left upper anterior chest. Mild fullness in the left supraclavicular fossa.     Portacath/PICC-without erythema  Lab Results:  Lab Results  Component Value Date   WBC 5.2 01/05/2015   HGB 13.9 01/05/2015   HCT 42.8 01/05/2015   MCV 93.5 01/05/2015   PLT 240 01/05/2015   NEUTROABS 3.0 01/05/2015     Lab Results  Component Value Date   CEA 2.2 01/05/2015    Imaging:  CTs of the chest, abdomen, and pelvis on 01/05/2015-multiple collateral vessels in the upper chest secondary to occlusion of the left brachiocephalic vein. No visible thrombus. Stable cysts in the liver.  Medications: I have reviewed the patient's current medications.  Assessment/Plan: 1. 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  CTs of the chest, abdomen, and pelvis on 01/10/2014 with a cecal mass, nonspecific small liver lesions, and nonspecific lung nodules felt to most likely  represent mucoid impaction  Cycle 1 FOLFOX 02/20/2014  Cycle 2 FOLFOX 03/06/2014  Cycle 3 FOLFOX 03/20/2014  Cycle 4 FOLFOX 04/03/2014  Cycle 5 FOLFOX held on 04/17/2014 due to neutropenia  Cycle 5 FOLFOX 04/24/2014 with Neulasta support  Cycle 6 FOLFOX 05/08/2014  Cycle 7 FOLFOX 05/22/2014 with Neulasta support  Cycle 8 FOLFOX 06/05/2014 (oxaliplatin held secondary to thrombocytopenia)  Cycle 9 FOLFOX 06/19/2014 with Neulasta support  Cycle 10 FOLFOX 07/03/2014 (oxaliplatin held secondary to thrombocytopenia)  Cycle 11 FOLFOX 07/17/2014  Cycle 12 FOLFOX 07/31/2014 (oxaliplatin dose reduced due to thrombocytopenia  CTs chest, abdomen, and pelvis on 01/05/2015-no evidence of recurrent disease  2. History of Iron deficiency anemia 3. Family history of breast cancer 4. Port-A-Cath placement 02/18/2014 5. Delayed nausea following cycle 1 FOLFOX. Aloxi and Emend added beginning with cycle 2. Prophylactic Decadron added beginning with cycle 3. 6. Neutropenia following cycle 4 FOLFOX. Neulasta was added with cycle 5. 7. Hypokalemia 04/17/2014. Question secondary to diarrhea. Potassium supplement initiated. 8. Left upper arm pain. Negative venous Doppler 03/07/2014. She was referred to orthopedics. 9. Thrombocytopenia secondary to chemotherapy 10. Mild oxaliplatin neuropathy 11. Hypertension   12.     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 Chumley remains in clinical remission from colon cancer. She will return for an office visit and CEA in 6 months. We will refer her to Dr. [name] for removal of the Port-A-Cath. Hopefully this will help the left upper chest discomfort. She will contact us if the discomfort does not improve. She had a  negative left upper extremity Doppler in February. I doubt she has an acute DVT. I will discuss the case with Dr. [name].  [name], MD  01/12/2015  12:53  PM

## 2015-01-12 NOTE — Addendum Note (Signed)
Addended by: Brien Few on: 01/12/2015 02:04 PM   Modules accepted: Orders

## 2015-02-12 ENCOUNTER — Ambulatory Visit: Payer: Self-pay | Admitting: Surgery

## 2015-03-16 ENCOUNTER — Encounter (HOSPITAL_BASED_OUTPATIENT_CLINIC_OR_DEPARTMENT_OTHER): Payer: Self-pay | Admitting: *Deleted

## 2015-03-17 ENCOUNTER — Other Ambulatory Visit: Payer: Self-pay | Admitting: *Deleted

## 2015-03-17 DIAGNOSIS — C189 Malignant neoplasm of colon, unspecified: Secondary | ICD-10-CM

## 2015-03-17 MED ORDER — ONDANSETRON 8 MG PO TBDP: 8.0000 mg | ORAL_TABLET | Freq: Three times a day (TID) | ORAL | Status: DC | PRN

## 2015-03-20 ENCOUNTER — Encounter (HOSPITAL_BASED_OUTPATIENT_CLINIC_OR_DEPARTMENT_OTHER)
Admission: RE | Admit: 2015-03-20 | Discharge: 2015-03-20 | Disposition: A | Discharge status: Home / Self Care | Payer: Medicare Other | Source: Ambulatory Visit | Attending: Surgery | Admitting: Surgery

## 2015-03-20 DIAGNOSIS — I10 Essential (primary) hypertension: Secondary | ICD-10-CM | POA: Diagnosis present

## 2015-03-20 DIAGNOSIS — Z01810 Encounter for preprocedural cardiovascular examination: Secondary | ICD-10-CM | POA: Diagnosis not present

## 2015-03-22 ENCOUNTER — Encounter (HOSPITAL_BASED_OUTPATIENT_CLINIC_OR_DEPARTMENT_OTHER): Payer: Self-pay | Admitting: Surgery

## 2015-03-22 NOTE — H&P (Signed)
  General Surgery [facility]  Gail Carlson DOB: [birth year] Widowed / Language: English / Race: Black or African American Female  History of Present Illness Patient words: discuss port removal. The patient is a 65 year old female who presents with colorectal cancer. Patient returns for follow-up. She had undergone colon resection for adenocarcinoma. She has now completed her course of chemotherapy. She has had some discomfort in the left upper chest wall and the left upper extremity. Her medical oncologist would like to have her infusion port removed. The port has functioned normally throughout her course of chemotherapy.  Allergies No Known Drug Allergies12/23/2015  Medication History Hydrocodone-Acetaminophen (5-325MG  Tablet, 1 (one) Tablet Oral every six hours, as needed, Taken starting 03/05/2014) Active. Ferrous Sulfate (325 (65 Fe)MG Tablet, Oral) Active. Ondansetron (8MG  Tablet Disperse, Oral as needed) Active. Lidocaine-Prilocaine (2.5-2.5% Cream, External) Active. AmLODIPine Besylate (10MG  Tablet, Oral) Active. LORazepam (0.5MG  Tablet, Oral) Active. Lisinopril-Hydrochlorothiazide (20-12.5MG  Tablet, Oral) Active. Medications Reconciled  Vitals Weight: 164 lb Height: 63in Body Surface Area: 1.78 m Body Mass Index: 29.05 kg/m  Temp.: 35F(Oral)  Pulse: 84 (Regular)  BP: 158/96 (Sitting, Left Arm, Standard)  Physical Exam  Limited examination  HEENT is normal.  Neck is soft, nontender, without mass.  Left anterior chest wall shows a well-healed surgical wound. Port-A-Cath remains in position with no sign of infection or seroma.  Upper extremities show no evidence of lymphedema. Strength is normal bilaterally.   Assessment & Plan  PRIMARY ADENOCARCINOMA OF COLON (C18.9)  Patient has completed her course of chemotherapy for treatment of adenocarcinoma of the colon. She has had some discomfort at the site of her  infusion port despite the port functioning normally. She would like to have the infusion port removed in the near future.  We will make plans for outpatient surgery for removal of left chest wall infusion port.  The risks and benefits of the procedure have been discussed at length with the patient. The patient understands the proposed procedure, potential alternative treatments, and the course of recovery to be expected. All of the patient's questions have been answered at this time. The patient wishes to proceed with surgery.  Earnstine Regal, MD, [facility] Office: [phone]

## 2015-03-24 ENCOUNTER — Encounter (HOSPITAL_BASED_OUTPATIENT_CLINIC_OR_DEPARTMENT_OTHER): Payer: Self-pay | Admitting: Certified Registered"

## 2015-03-24 ENCOUNTER — Encounter (HOSPITAL_BASED_OUTPATIENT_CLINIC_OR_DEPARTMENT_OTHER)
Admission: RE | Disposition: A | Discharge status: Home / Self Care | Payer: Self-pay | Source: Ambulatory Visit | Attending: Surgery

## 2015-03-24 ENCOUNTER — Ambulatory Visit (HOSPITAL_BASED_OUTPATIENT_CLINIC_OR_DEPARTMENT_OTHER): Payer: Medicare Other | Admitting: Certified Registered"

## 2015-03-24 ENCOUNTER — Ambulatory Visit (HOSPITAL_BASED_OUTPATIENT_CLINIC_OR_DEPARTMENT_OTHER)
Admission: RE | Admit: 2015-03-24 | Discharge: 2015-03-24 | Disposition: A | Discharge status: Home / Self Care | Payer: Medicare Other | Source: Ambulatory Visit | Attending: Surgery | Admitting: Surgery

## 2015-03-24 DIAGNOSIS — Z79891 Long term (current) use of opiate analgesic: Secondary | ICD-10-CM | POA: Insufficient documentation

## 2015-03-24 DIAGNOSIS — Z9049 Acquired absence of other specified parts of digestive tract: Secondary | ICD-10-CM | POA: Insufficient documentation

## 2015-03-24 DIAGNOSIS — Z85038 Personal history of other malignant neoplasm of large intestine: Secondary | ICD-10-CM | POA: Insufficient documentation

## 2015-03-24 DIAGNOSIS — F17200 Nicotine dependence, unspecified, uncomplicated: Secondary | ICD-10-CM | POA: Diagnosis not present

## 2015-03-24 DIAGNOSIS — Z9221 Personal history of antineoplastic chemotherapy: Secondary | ICD-10-CM | POA: Diagnosis not present

## 2015-03-24 DIAGNOSIS — C189 Malignant neoplasm of colon, unspecified: Secondary | ICD-10-CM | POA: Diagnosis present

## 2015-03-24 DIAGNOSIS — K219 Gastro-esophageal reflux disease without esophagitis: Secondary | ICD-10-CM | POA: Insufficient documentation

## 2015-03-24 DIAGNOSIS — Z79899 Other long term (current) drug therapy: Secondary | ICD-10-CM | POA: Diagnosis not present

## 2015-03-24 DIAGNOSIS — Z452 Encounter for adjustment and management of vascular access device: Secondary | ICD-10-CM | POA: Diagnosis present

## 2015-03-24 DIAGNOSIS — I10 Essential (primary) hypertension: Secondary | ICD-10-CM | POA: Diagnosis not present

## 2015-03-24 DIAGNOSIS — D649 Anemia, unspecified: Secondary | ICD-10-CM | POA: Insufficient documentation

## 2015-03-24 HISTORY — PX: PORT-A-CATH REMOVAL: SHX5289

## 2015-03-24 SURGERY — REMOVAL PORT-A-CATH
Anesthesia: General | Site: Chest | Laterality: Left

## 2015-03-24 MED ORDER — MIDAZOLAM HCL 2 MG/2ML IJ SOLN
INTRAMUSCULAR | Status: AC
  Filled 2015-03-24: qty 2

## 2015-03-24 MED ORDER — EPHEDRINE SULFATE 50 MG/ML IJ SOLN
INTRAMUSCULAR | Status: DC | PRN
  Administered 2015-03-24: 10 mg via INTRAVENOUS

## 2015-03-24 MED ORDER — ONDANSETRON HCL 4 MG/2ML IJ SOLN
INTRAMUSCULAR | Status: AC
  Filled 2015-03-24: qty 2

## 2015-03-24 MED ORDER — LIDOCAINE HCL (CARDIAC) 20 MG/ML IV SOLN
INTRAVENOUS | Status: AC
  Filled 2015-03-24: qty 5

## 2015-03-24 MED ORDER — FENTANYL CITRATE (PF) 100 MCG/2ML IJ SOLN
INTRAMUSCULAR | Status: AC
  Filled 2015-03-24: qty 2

## 2015-03-24 MED ORDER — CEFAZOLIN SODIUM-DEXTROSE 2-3 GM-% IV SOLR
INTRAVENOUS | Status: AC
  Filled 2015-03-24: qty 50

## 2015-03-24 MED ORDER — MIDAZOLAM HCL 2 MG/2ML IJ SOLN
1.0000 mg | INTRAMUSCULAR | Status: DC | PRN
  Administered 2015-03-24: 1 mg via INTRAVENOUS

## 2015-03-24 MED ORDER — DEXAMETHASONE SODIUM PHOSPHATE 4 MG/ML IJ SOLN
INTRAMUSCULAR | Status: DC | PRN
  Administered 2015-03-24: 10 mg via INTRAVENOUS

## 2015-03-24 MED ORDER — PROPOFOL 10 MG/ML IV BOLUS
INTRAVENOUS | Status: DC | PRN
  Administered 2015-03-24: 100 mg via INTRAVENOUS

## 2015-03-24 MED ORDER — ONDANSETRON HCL 4 MG/2ML IJ SOLN
INTRAMUSCULAR | Status: DC | PRN
Start: 2015-03-24 — End: 2015-03-24
  Administered 2015-03-24: 4 mg via INTRAVENOUS

## 2015-03-24 MED ORDER — PROPOFOL 500 MG/50ML IV EMUL
INTRAVENOUS | Status: AC
  Filled 2015-03-24: qty 50

## 2015-03-24 MED ORDER — FENTANYL CITRATE (PF) 100 MCG/2ML IJ SOLN
50.0000 ug | INTRAMUSCULAR | Status: DC | PRN
  Administered 2015-03-24: 50 ug via INTRAVENOUS

## 2015-03-24 MED ORDER — CEFAZOLIN SODIUM-DEXTROSE 2-3 GM-% IV SOLR
2.0000 g | INTRAVENOUS | Status: AC
  Administered 2015-03-24: 2 g via INTRAVENOUS

## 2015-03-24 MED ORDER — FENTANYL CITRATE (PF) 100 MCG/2ML IJ SOLN: 25.0000 ug | INTRAMUSCULAR | Status: DC | PRN

## 2015-03-24 MED ORDER — LACTATED RINGERS IV SOLN
INTRAVENOUS | Status: DC
  Administered 2015-03-24: 07:00:00 via INTRAVENOUS

## 2015-03-24 MED ORDER — LIDOCAINE HCL (CARDIAC) 20 MG/ML IV SOLN
INTRAVENOUS | Status: DC | PRN
  Administered 2015-03-24: 30 mg via INTRAVENOUS

## 2015-03-24 MED ORDER — GLYCOPYRROLATE 0.2 MG/ML IJ SOLN: 0.2000 mg | Freq: Once | INTRAMUSCULAR | Status: DC | PRN

## 2015-03-24 MED ORDER — BUPIVACAINE HCL 0.25 % IJ SOLN
INTRAMUSCULAR | Status: DC | PRN
  Administered 2015-03-24: 8 mL

## 2015-03-24 MED ORDER — LIDOCAINE HCL (PF) 1 % IJ SOLN
INTRAMUSCULAR | Status: AC
  Filled 2015-03-24: qty 30

## 2015-03-24 MED ORDER — DEXAMETHASONE SODIUM PHOSPHATE 10 MG/ML IJ SOLN
INTRAMUSCULAR | Status: AC
  Filled 2015-03-24: qty 1

## 2015-03-24 MED ORDER — SCOPOLAMINE 1 MG/3DAYS TD PT72: 1.0000 | MEDICATED_PATCH | Freq: Once | TRANSDERMAL | Status: DC | PRN

## 2015-03-24 MED ORDER — HYDROCODONE-ACETAMINOPHEN 5-325 MG PO TABS: 1.0000 | ORAL_TABLET | ORAL | Status: DC | PRN

## 2015-03-24 SURGICAL SUPPLY — 33 items
APL SKNCLS STERI-STRIP NONHPOA (GAUZE/BANDAGES/DRESSINGS) ×1
BENZOIN TINCTURE PRP APPL 2/3 (GAUZE/BANDAGES/DRESSINGS) ×3 IMPLANT
BLADE SURG 15 STRL LF DISP TIS (BLADE) ×1 IMPLANT
BLADE SURG 15 STRL SS (BLADE) ×3
CHLORAPREP W/TINT 26ML (MISCELLANEOUS) ×3 IMPLANT
CLOSURE WOUND 1/2 X4 (GAUZE/BANDAGES/DRESSINGS) ×1
COVER BACK TABLE 60X90IN (DRAPES) ×3 IMPLANT
COVER MAYO STAND STRL (DRAPES) ×3 IMPLANT
DECANTER SPIKE VIAL GLASS SM (MISCELLANEOUS) ×2 IMPLANT
DRAPE LAPAROTOMY 100X72 PEDS (DRAPES) ×3 IMPLANT
DRAPE UTILITY XL STRL (DRAPES) ×3 IMPLANT
ELECT REM PT RETURN 9FT ADLT (ELECTROSURGICAL) ×3
ELECTRODE REM PT RTRN 9FT ADLT (ELECTROSURGICAL) ×1 IMPLANT
GLOVE BIO SURGEON STRL SZ7 (GLOVE) ×2 IMPLANT
GLOVE ECLIPSE 6.5 STRL STRAW (GLOVE) ×2 IMPLANT
GLOVE SURG ORTHO 8.0 STRL STRW (GLOVE) ×3 IMPLANT
GOWN STRL REUS W/ TWL LRG LVL3 (GOWN DISPOSABLE) ×1 IMPLANT
GOWN STRL REUS W/ TWL XL LVL3 (GOWN DISPOSABLE) ×1 IMPLANT
GOWN STRL REUS W/TWL LRG LVL3 (GOWN DISPOSABLE) ×6
GOWN STRL REUS W/TWL XL LVL3 (GOWN DISPOSABLE) ×3
NDL HYPO 25X1 1.5 SAFETY (NEEDLE) IMPLANT
NEEDLE HYPO 25X1 1.5 SAFETY (NEEDLE) ×3 IMPLANT
PACK BASIN DAY SURGERY FS (CUSTOM PROCEDURE TRAY) ×3 IMPLANT
PENCIL BUTTON HOLSTER BLD 10FT (ELECTRODE) ×3 IMPLANT
SLEEVE SCD COMPRESS KNEE MED (MISCELLANEOUS) IMPLANT
SPONGE GAUZE 4X4 12PLY STER LF (GAUZE/BANDAGES/DRESSINGS) ×3 IMPLANT
STRIP CLOSURE SKIN 1/2X4 (GAUZE/BANDAGES/DRESSINGS) ×2 IMPLANT
SUT MNCRL AB 4-0 PS2 18 (SUTURE) ×3 IMPLANT
SUT VIC AB 3-0 SH 27 (SUTURE) ×3
SUT VIC AB 3-0 SH 27X BRD (SUTURE) ×1 IMPLANT
SYR CONTROL 10ML LL (SYRINGE) IMPLANT
TOWEL OR 17X24 6PK STRL BLUE (TOWEL DISPOSABLE) ×6 IMPLANT
TOWEL OR NON WOVEN STRL DISP B (DISPOSABLE) ×3 IMPLANT

## 2015-03-24 NOTE — Anesthesia Procedure Notes (Signed)
Procedure Name: LMA Insertion Date/Time: 03/24/2015 7:26 AM Performed by: Lawyer Washabaugh D Pre-anesthesia Checklist: Patient identified, Emergency Drugs available, Suction available and Patient being monitored Patient Re-evaluated:Patient Re-evaluated prior to inductionOxygen Delivery Method: Circle System Utilized Preoxygenation: Pre-oxygenation with 100% oxygen Intubation Type: IV induction Ventilation: Mask ventilation without difficulty LMA: LMA inserted LMA Size: 4.0 Number of attempts: 1 Airway Equipment and Method: Bite block Placement Confirmation: positive ETCO2 Tube secured with: Tape Dental Injury: Teeth and Oropharynx as per pre-operative assessment

## 2015-03-24 NOTE — Brief Op Note (Signed)
03/24/2015  7:56 AM  PATIENT:  Gail Carlson  65 y.o. female  PRE-OPERATIVE DIAGNOSIS:  Colon cancer  POST-OPERATIVE DIAGNOSIS:  Colon cancer  PROCEDURE:  Procedure(s): REMOVAL PORT-A-CATH (Left)  SURGEON:  Surgeon(s) and Role:    * Armandina Gemma, MD - Primary  ANESTHESIA:   general  EBL:  Total I/O In: 600 [I.V.:600] Out: -   BLOOD ADMINISTERED:none  DRAINS: none   LOCAL MEDICATIONS USED:  MARCAINE     SPECIMEN:  No Specimen  DISPOSITION OF SPECIMEN:  N/A  COUNTS:  YES  TOURNIQUET:  * No tourniquets in log *  DICTATION: .Other Dictation: Dictation Number [id number]  PLAN OF CARE: Discharge to home after PACU  PATIENT DISPOSITION:  PACU - hemodynamically stable.   Delay start of Pharmacological VTE agent (>24hrs) due to surgical blood loss or risk of bleeding: yes  Earnstine Regal, MD, [facility] Office: [phone]

## 2015-03-24 NOTE — Discharge Instructions (Signed)

## 2015-03-24 NOTE — Anesthesia Postprocedure Evaluation (Signed)
Anesthesia Post Note  Patient: Gail Carlson  Procedure(s) Performed: Procedure(s) (LRB): REMOVAL PORT-A-CATH (Left)  Patient location during evaluation: PACU Anesthesia Type: General Level of consciousness: awake and alert Pain management: pain level controlled Vital Signs Assessment: post-procedure vital signs reviewed and stable Respiratory status: spontaneous breathing, nonlabored ventilation and respiratory function stable Cardiovascular status: blood pressure returned to baseline and stable Postop Assessment: no signs of nausea or vomiting Anesthetic complications: no    Last Vitals:  Filed Vitals:   03/24/15 0830 03/24/15 0912  BP: 122/78 133/77  Pulse: 65 65  Temp:  36.4 C  Resp: 16 18    Last Pain:  Filed Vitals:   03/24/15 0913  PainSc: 0-No pain                 Aadhav Uhlig,W. EDMOND

## 2015-03-24 NOTE — Op Note (Signed)
Gail Carlson, PERROTTI NO.:  [id number]  MEDICAL RECORD NO.:  [id number]  LOCATION:                                 FACILITY:  PHYSICIAN:  Earnstine Regal, MD      DATE OF BIRTH:  October 31, 1950  DATE OF PROCEDURE:  03/24/2015                              OPERATIVE REPORT   [facility]  PREOPERATIVE DIAGNOSIS:  History of adenocarcinoma of the colon.  POSTOPERATIVE DIAGNOSIS:  History of adenocarcinoma of the colon.  PROCEDURE:  Removal of left subclavian vein infusion port.  SURGEON:  Earnstine Regal, MD, FACS  ANESTHESIA:  General.  ESTIMATED BLOOD LOSS:  Minimal.  PREPARATION:  ChloraPrep.  COMPLICATIONS:  None.  INDICATIONS:  The patient is a 65 year old female, who underwent treatment for adenocarcinoma of the colon.  She has now completed adjuvant chemotherapy and desires removal of her infusion port.  BODY OF REPORT:  Procedure was done in OR #1 at the [facility]  The patient was brought to the operating room, placed in supine position on the operating room table.  Following administration of general anesthesia, the patient was prepped and draped in usual aseptic fashion.  After ascertaining that in an adequate level of anesthesia had been achieved, the previous incision in the upper left chest wall was reopened with a #15 blade.  Dissection was carried down to the fiber sheath surrounding the port.  This was opened.  The port was mobilized with a hemostat and delivered through the incision.  Two Prolene sutures secured the port to the chest wall.  These were both extracted.  There was a cuff of tissue around the hub of the port, which was excised with the electrocautery allowing for complete mobilization of the port.  Port was removed in its entirety including the entire catheter.  It was discarded in the usual fashion.  The catheter tract site was closed with a 3-0 Vicryl figure-of-eight suture.  The fiber sheath was  obliterated with the electrocautery.  The subcutaneous tissues were closed with interrupted 3-0 Vicryl sutures.  Skin was anesthetized with local anesthetic.  Skin edges were reapproximated with a running 4-0 Monocryl subcuticular suture.  Wound was washed and dried and benzoin and Steri-Strips were applied.  Sterile dressings were applied.  The patient was awakened from anesthesia and brought to the recovery room.  The patient tolerated the procedure well.   Earnstine Regal, MD, [facility] Office: [phone]    TMG/MEDQ  D:  03/24/2015  T:  03/24/2015  Job:  [id number]  cc:   Leslye Peer, MD Bill [place]. Rankins, M.D. Earnstine Regal, MD

## 2015-03-24 NOTE — Anesthesia Preprocedure Evaluation (Addendum)
Anesthesia Evaluation  Patient identified by MRN, date of birth, ID band Patient awake    Reviewed: Allergy & Precautions, H&P , NPO status , Patient's Chart, lab work & pertinent test results  Airway Mallampati: III  TM Distance: >3 FB Neck ROM: Full    Dental no notable dental hx. (+) Teeth Intact, Dental Advisory Given   Pulmonary Current Smoker,    Pulmonary exam normal breath sounds clear to auscultation       Cardiovascular hypertension, Pt. on medications negative cardio ROS   Rhythm:Regular Rate:Normal     Neuro/Psych PSYCHIATRIC DISORDERS Depression negative neurological ROS     GI/Hepatic Neg liver ROS, GERD  Medicated,  Endo/Other  negative endocrine ROS  Renal/GU negative Renal ROS  negative genitourinary   Musculoskeletal   Abdominal   Peds  Hematology negative hematology ROS (+) anemia ,   Anesthesia Other Findings   Reproductive/Obstetrics negative OB ROS                           Anesthesia Physical Anesthesia Plan  ASA: II  Anesthesia Plan: General   Post-op Pain Management:    Induction: Intravenous  Airway Management Planned: LMA  Additional Equipment:   Intra-op Plan:   Post-operative Plan: Extubation in OR  Informed Consent: I have reviewed the patients History and Physical, chart, labs and discussed the procedure including the risks, benefits and alternatives for the proposed anesthesia with the patient or authorized representative who has indicated his/her understanding and acceptance.   Dental advisory given  Plan Discussed with: CRNA  Anesthesia Plan Comments:         Anesthesia Quick Evaluation

## 2015-03-24 NOTE — Interval H&P Note (Signed)
History and Physical Interval Note:  03/24/2015 7:03 AM  Gail Carlson  has presented today for surgery, with the diagnosis of Colon cancer.  The various methods of treatment have been discussed with the patient and family. After consideration of risks, benefits and other options for treatment, the patient has consented to    Procedure(s): REMOVAL PORT-A-CATH (N/A) as a surgical intervention .    The patient's history has been reviewed, patient examined, no change in status, stable for surgery.  I have reviewed the patient's chart and labs.  Questions were answered to the patient's satisfaction.    Earnstine Regal, MD, [facility] Office: [place]

## 2015-03-24 NOTE — Transfer of Care (Signed)
Immediate Anesthesia Transfer of Care Note  Patient: Gail Carlson  Procedure(s) Performed: Procedure(s): REMOVAL PORT-A-CATH (Left)  Patient Location: PACU  Anesthesia Type:General  Level of Consciousness: awake and patient cooperative  Airway & Oxygen Therapy: Patient Spontanous Breathing and Patient connected to face mask oxygen  Post-op Assessment: Report given to RN and Post -op Vital signs reviewed and stable  Post vital signs: Reviewed and stable  Last Vitals:  Filed Vitals:   03/24/15 0650  BP: 159/80  Pulse: 62  Temp: 37 C  Resp: 16    Complications: No apparent anesthesia complications

## 2015-03-25 ENCOUNTER — Encounter (HOSPITAL_BASED_OUTPATIENT_CLINIC_OR_DEPARTMENT_OTHER): Payer: Self-pay | Admitting: Surgery

## 2015-04-07 ENCOUNTER — Other Ambulatory Visit: Payer: Self-pay | Admitting: Nurse Practitioner

## 2015-04-13 ENCOUNTER — Other Ambulatory Visit: Payer: Self-pay

## 2015-04-13 DIAGNOSIS — Z1231 Encounter for screening mammogram for malignant neoplasm of breast: Secondary | ICD-10-CM

## 2015-04-29 ENCOUNTER — Ambulatory Visit: Payer: Medicare Other

## 2015-05-15 ENCOUNTER — Other Ambulatory Visit: Payer: Self-pay | Admitting: *Deleted

## 2015-05-15 MED ORDER — POTASSIUM CHLORIDE 20 MEQ/15ML (10%) PO SOLN: 20.0000 meq | Freq: Every day | ORAL | Status: DC

## 2015-05-21 ENCOUNTER — Ambulatory Visit: Payer: Medicare Other

## 2015-05-22 ENCOUNTER — Other Ambulatory Visit: Payer: Self-pay | Admitting: *Deleted

## 2015-05-22 DIAGNOSIS — C189 Malignant neoplasm of colon, unspecified: Secondary | ICD-10-CM

## 2015-05-22 MED ORDER — ONDANSETRON 8 MG PO TBDP: 8.0000 mg | ORAL_TABLET | Freq: Three times a day (TID) | ORAL | Status: DC | PRN

## 2015-06-09 ENCOUNTER — Ambulatory Visit
Admission: RE | Admit: 2015-06-09 | Discharge: 2015-06-09 | Disposition: A | Discharge status: Home / Self Care | Payer: Medicare Other | Source: Ambulatory Visit

## 2015-06-09 ENCOUNTER — Ambulatory Visit: Payer: Medicare Other

## 2015-06-09 DIAGNOSIS — Z1231 Encounter for screening mammogram for malignant neoplasm of breast: Secondary | ICD-10-CM

## 2015-07-09 ENCOUNTER — Telehealth: Payer: Self-pay | Admitting: Oncology

## 2015-07-09 NOTE — Telephone Encounter (Signed)
S.w. Pt and confirmed appts

## 2015-07-13 ENCOUNTER — Other Ambulatory Visit: Payer: Medicare Other

## 2015-07-13 ENCOUNTER — Telehealth: Payer: Self-pay | Admitting: Medical Oncology

## 2015-07-13 ENCOUNTER — Ambulatory Visit: Payer: Medicare Other | Admitting: Nurse Practitioner

## 2015-07-13 NOTE — Telephone Encounter (Signed)
I called pt who missed appt today . She attended her granddaughters graduation festivities this past weekend and has some diarrrhea today . She canceled her appt and wants to r/s. Onc tx request sent.

## 2015-07-14 ENCOUNTER — Telehealth: Payer: Self-pay | Admitting: Oncology

## 2015-07-14 ENCOUNTER — Telehealth: Payer: Self-pay | Admitting: *Deleted

## 2015-07-14 NOTE — Telephone Encounter (Signed)
spoke w/ pt confirmed 6/14 apt

## 2015-07-14 NOTE — Telephone Encounter (Signed)
Called pt with appt for 6/14. She agrees to come in at 1030.

## 2015-07-15 ENCOUNTER — Telehealth: Payer: Self-pay | Admitting: Nurse Practitioner

## 2015-07-15 ENCOUNTER — Other Ambulatory Visit (HOSPITAL_BASED_OUTPATIENT_CLINIC_OR_DEPARTMENT_OTHER): Payer: Medicare Other

## 2015-07-15 ENCOUNTER — Ambulatory Visit (HOSPITAL_BASED_OUTPATIENT_CLINIC_OR_DEPARTMENT_OTHER): Payer: Medicare Other | Admitting: Oncology

## 2015-07-15 VITALS — BP 152/83 | HR 76 | Temp 98.4°F | Resp 20 | Ht 62.0 in | Wt 170.7 lb

## 2015-07-15 DIAGNOSIS — C180 Malignant neoplasm of cecum: Secondary | ICD-10-CM

## 2015-07-15 DIAGNOSIS — Z85038 Personal history of other malignant neoplasm of large intestine: Secondary | ICD-10-CM

## 2015-07-15 DIAGNOSIS — D509 Iron deficiency anemia, unspecified: Secondary | ICD-10-CM | POA: Diagnosis not present

## 2015-07-15 DIAGNOSIS — D6959 Other secondary thrombocytopenia: Secondary | ICD-10-CM | POA: Diagnosis not present

## 2015-07-15 DIAGNOSIS — G620 Drug-induced polyneuropathy: Secondary | ICD-10-CM

## 2015-07-15 DIAGNOSIS — C182 Malignant neoplasm of ascending colon: Secondary | ICD-10-CM

## 2015-07-15 DIAGNOSIS — E876 Hypokalemia: Secondary | ICD-10-CM

## 2015-07-15 DIAGNOSIS — I10 Essential (primary) hypertension: Secondary | ICD-10-CM | POA: Diagnosis not present

## 2015-07-15 MED ORDER — DOXYCYCLINE HYCLATE 100 MG PO TABS: 100.0000 mg | ORAL_TABLET | Freq: Two times a day (BID) | ORAL | Status: DC

## 2015-07-15 NOTE — Progress Notes (Signed)
  [place] OFFICE PROGRESS NOTE   Diagnosis: Colon cancer  INTERVAL HISTORY:   Gail Carlson returns for scheduled visit. The Port-A-Cath was removed 03/24/2015. She reports feeling well. She notes fullness in the left abdomen when she strains the abdominal muscles. No difficulty with bowel function. For the past few weeks she has noted erythema and fullness at the right face. Neuropathy symptoms have improved.  Objective:  Vital signs in last 24 hours:  Blood pressure 155/96, pulse 76, temperature 98.4 F (36.9 C), temperature source Oral, resp. rate 20, height _0  (1.575 m), weight 170 lb 11.2 oz (77.429 kg), SpO2 100 %.    HEENT: Multiple missing teeth, no tenderness at the gumline or purulence. Mild induration at the right cheek and right infraorbital area with faint erythema. No fluctuance. Lymphatics: No cervical, supra-clavicular, axillary, or inguinal nodes Resp: Lungs clear bilaterally Cardio: Regular rate and rhythm GI: No hepatosplenomegaly, no mass, nontender. There appears to be reducible incisional hernia at the left upper abdomen Vascular: No leg edema   Lab Results:  CEA pending Medications: I have reviewed the patient's current medications.  Assessment/Plan: 1. 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  CTs of the chest, abdomen, and pelvis on 01/10/2014 with a cecal mass, nonspecific small liver lesions, and nonspecific lung nodules felt to most likely represent mucoid impaction  Cycle 1 FOLFOX 02/20/2014  Cycle 2 FOLFOX 03/06/2014  Cycle 3 FOLFOX 03/20/2014  Cycle 4 FOLFOX 04/03/2014  Cycle 5 FOLFOX held on 04/17/2014 due to neutropenia  Cycle 5 FOLFOX 04/24/2014 with Neulasta support  Cycle 6 FOLFOX 05/08/2014  Cycle 7 FOLFOX 05/22/2014 with Neulasta support  Cycle  8 FOLFOX 06/05/2014 (oxaliplatin held secondary to thrombocytopenia)  Cycle 9 FOLFOX 06/19/2014 with Neulasta support  Cycle 10 FOLFOX 07/03/2014 (oxaliplatin held secondary to thrombocytopenia)  Cycle 11 FOLFOX 07/17/2014  Cycle 12 FOLFOX 07/31/2014 (oxaliplatin dose reduced due to thrombocytopenia  CTs chest, abdomen, and pelvis on 01/05/2015-no evidence of recurrent disease  2. History of Iron deficiency anemia 3. Family history of breast cancer 4. Port-A-Cath placement 02/18/2014 5. Delayed nausea following cycle 1 FOLFOX. Aloxi and Emend added beginning with cycle 2. Prophylactic Decadron added beginning with cycle 3. 6. Neutropenia following cycle 4 FOLFOX. Neulasta was added with cycle 5. 7. Hypokalemia 04/17/2014. Question secondary to diarrhea. Potassium supplement initiated. 8. Left upper arm pain. Negative venous Doppler 03/07/2014. She was referred to orthopedics. 9. Thrombocytopenia secondary to chemotherapy 10. Mild oxaliplatin neuropathy 11. Hypertension  12. 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     Right facial cellulitis 07/15/2015-treated with doxycycline   Disposition:  Ms. Lasure remains in clinical remission from colon cancer. We will follow-up on the CEA from today. She will return for an office visit and CEA in 6 months.  She will be referred to Dr. Henrene Pastor for a surveillance colonoscopy.  She appears to have a cellulitis at the right side of her face. We prescribed a course of doxycycline. She will seek medical attention if the erythema and swelling do not resolve.    [name], MD  07/15/2015  10:45 AM

## 2015-07-15 NOTE — Telephone Encounter (Signed)
perp fo to sch pt appt-gave pt copy of avs-gave pt contrast

## 2015-07-17 ENCOUNTER — Telehealth: Payer: Self-pay

## 2015-07-17 LAB — CEA (PARALLEL TESTING): CEA: 2.5 ng/mL — AB

## 2015-07-17 NOTE — Telephone Encounter (Signed)
Pre visit and colon scheduled °

## 2015-07-17 NOTE — Telephone Encounter (Signed)
Please arrange colonoscopy in [place] with me.         ----- Message -----     From: Irene Shipper, MD     Sent: 07/15/2015  3:32 PM      To: Marlon Pel, RN, Ladene Artist, MD        Barbera Setters,    See note from Dr. Benay Spice. I did colonoscopy in hospital, but actually Dr. Fuller Plan patient (supervised in office). The patient needs surveillance colonoscopy. Please help arrange with Dr. Fuller Plan. Thank you.    Dr. Henrene Pastor    ----- Message -----     From: Ladell Pier, MD     Sent: 07/15/2015  2:15 PM      To: Irene Shipper, MD

## 2015-07-21 ENCOUNTER — Telehealth: Payer: Self-pay

## 2015-07-21 NOTE — Telephone Encounter (Signed)
Called and informed pt of normal cea results and to follow up as scheduled, pt verbalized understanding.

## 2015-07-21 NOTE — Telephone Encounter (Signed)
-----   Message from Ladell Pier, MD sent at 07/20/2015  8:32 PM EDT ----- Please call patient, cea is normal, f/u as scheduled

## 2015-09-07 ENCOUNTER — Encounter: Payer: Self-pay | Admitting: Gastroenterology

## 2015-09-07 ENCOUNTER — Ambulatory Visit (AMBULATORY_SURGERY_CENTER): Payer: Self-pay

## 2015-09-07 VITALS — Ht 63.0 in | Wt 171.0 lb

## 2015-09-07 DIAGNOSIS — C182 Malignant neoplasm of ascending colon: Secondary | ICD-10-CM

## 2015-09-07 MED ORDER — SUPREP BOWEL PREP KIT 17.5-3.13-1.6 GM/177ML PO SOLN
1.0000 | Freq: Once | ORAL | 0 refills | Status: AC
Start: 2015-09-07 — End: 2015-09-07

## 2015-09-07 NOTE — Progress Notes (Signed)
No allergies to eggs or soy No past problems with anesthesia No diet meds No home oxygen  Declined emmi 

## 2015-09-08 ENCOUNTER — Telehealth: Payer: Self-pay | Admitting: Gastroenterology

## 2015-09-08 NOTE — Telephone Encounter (Signed)
Patient must have spoke to you regarding getting a prep. Please call patient.

## 2015-09-09 NOTE — Telephone Encounter (Signed)
Pt called and reminded to call us on Friday if she has not heard from [facility] [facility] regarding prep.  Pt stated that this was her plan and she wasn't sure why she was being called to day on Wednesday.

## 2015-09-09 NOTE — Telephone Encounter (Signed)
Left a message for pt that sample is ready for pick up.

## 2015-09-15 ENCOUNTER — Encounter: Payer: Self-pay | Admitting: Gastroenterology

## 2015-09-15 ENCOUNTER — Ambulatory Visit (AMBULATORY_SURGERY_CENTER): Payer: Medicare Other | Admitting: Gastroenterology

## 2015-09-15 VITALS — BP 137/77 | HR 66 | Temp 98.0°F | Resp 11 | Ht 63.0 in | Wt 171.0 lb

## 2015-09-15 DIAGNOSIS — Z85038 Personal history of other malignant neoplasm of large intestine: Secondary | ICD-10-CM | POA: Diagnosis present

## 2015-09-15 DIAGNOSIS — D123 Benign neoplasm of transverse colon: Secondary | ICD-10-CM

## 2015-09-15 MED ORDER — SODIUM CHLORIDE 0.9 % IV SOLN: 500.0000 mL | INTRAVENOUS | Status: DC

## 2015-09-15 NOTE — Patient Instructions (Signed)
YOU HAD AN ENDOSCOPIC PROCEDURE TODAY AT [facility]:   Refer to the procedure report that was given to you for any specific questions about what was found during the examination.  If the procedure report does not answer your questions, please call your gastroenterologist to clarify.  If you requested that your care partner not be given the details of your procedure findings, then the procedure report has been included in a sealed envelope for you to review at your convenience later.  YOU SHOULD EXPECT: Some feelings of bloating in the abdomen. Passage of more gas than usual.  Walking can help get rid of the air that was put into your GI tract during the procedure and reduce the bloating. If you had a lower endoscopy (such as a colonoscopy or flexible sigmoidoscopy) you may notice spotting of blood in your stool or on the toilet paper. If you underwent a bowel prep for your procedure, you may not have a normal bowel movement for a few days.  Please Note:  You might notice some irritation and congestion in your nose or some drainage.  This is from the oxygen used during your procedure.  There is no need for concern and it should clear up in a day or so.  SYMPTOMS TO REPORT IMMEDIATELY:   Following lower endoscopy (colonoscopy or flexible sigmoidoscopy):  Excessive amounts of blood in the stool  Significant tenderness or worsening of abdominal pains  Swelling of the abdomen that is new, acute  Fever of 100F or higher   Following upper endoscopy (EGD)  Vomiting of blood or coffee ground material  New chest pain or pain under the shoulder blades  Painful or persistently difficult swallowing  New shortness of breath  Fever of 100F or higher  Black, tarry-looking stools  For urgent or emergent issues, a gastroenterologist can be reached at any hour by calling [phone].   DIET:  We do recommend a small meal at first, but then you may proceed to your regular diet.  Drink  plenty of fluids but you should avoid alcoholic beverages for 24 hours.  ACTIVITY:  You should plan to take it easy for the rest of today and you should NOT DRIVE or use heavy machinery until tomorrow (because of the sedation medicines used during the test).    FOLLOW UP: Our staff will call the number listed on your records the next business day following your procedure to check on you and address any questions or concerns that you may have regarding the information given to you following your procedure. If we do not reach you, we will leave a message.  However, if you are feeling well and you are not experiencing any problems, there is no need to return our call.  We will assume that you have returned to your regular daily activities without incident.  If any biopsies were taken you will be contacted by phone or by letter within the next 1-3 weeks.  Please call us at [phone] if you have not heard about the biopsies in 3 weeks.    SIGNATURES/CONFIDENTIALITY: You and/or your care partner have signed paperwork which will be entered into your electronic medical record.  These signatures attest to the fact that that the information above on your After Visit Summary has been reviewed and is understood.  Full responsibility of the confidentiality of this discharge information lies with you and/or your care-partner.YOU HAD AN ENDOSCOPIC PROCEDURE TODAY AT [facility]:  Refer to the procedure report that was given to you for any specific questions about what was found during the examination.  If the procedure report does not answer your questions, please call your gastroenterologist to clarify.  If you requested that your care partner not be given the details of your procedure findings, then the procedure report has been included in a sealed envelope for you to review at your convenience later.  YOU SHOULD EXPECT: Some feelings of bloating in the abdomen. Passage of more gas than  usual.  Walking can help get rid of the air that was put into your GI tract during the procedure and reduce the bloating. If you had a lower endoscopy (such as a colonoscopy or flexible sigmoidoscopy) you may notice spotting of blood in your stool or on the toilet paper. If you underwent a bowel prep for your procedure, you may not have a normal bowel movement for a few days.  Please Note:  You might notice some irritation and congestion in your nose or some drainage.  This is from the oxygen used during your procedure.  There is no need for concern and it should clear up in a day or so.  SYMPTOMS TO REPORT IMMEDIATELY:   Following lower endoscopy (colonoscopy or flexible sigmoidoscopy):  Excessive amounts of blood in the stool  Significant tenderness or worsening of abdominal pains  Swelling of the abdomen that is new, acute  Fever of 100F or higher   For urgent or emergent issues, a gastroenterologist can be reached at any hour by calling [phone].   DIET:  We do recommend a small meal at first, but then you may proceed to your regular diet.  Drink plenty of fluids but you should avoid alcoholic beverages for 24 hours.  ACTIVITY:  You should plan to take it easy for the rest of today and you should NOT DRIVE or use heavy machinery until tomorrow (because of the sedation medicines used during the test).    FOLLOW UP: Our staff will call the number listed on your records the next business day following your procedure to check on you and address any questions or concerns that you may have regarding the information given to you following your procedure. If we do not reach you, we will leave a message.  However, if you are feeling well and you are not experiencing any problems, there is no need to return our call.  We will assume that you have returned to your regular daily activities without incident.  If any biopsies were taken you will be contacted by phone or by letter within the next  1-3 weeks.  Please call us at [phone] if you have not heard about the biopsies in 3 weeks.    SIGNATURES/CONFIDENTIALITY: You and/or your care partner have signed paperwork which will be entered into your electronic medical record.  These signatures attest to the fact that that the information above on your After Visit Summary has been reviewed and is understood.  Full responsibility of the confidentiality of this discharge information lies with you and/or your care-partner.  Read all of the handouts given to you by your recovery room nurse.  Thanks for choosing [name] for your healthcare needs today.

## 2015-09-15 NOTE — Op Note (Signed)
[facility] Patient Name: Gail Carlson Procedure Date: 09/15/2015 11:12 AM MRN: [id number] Endoscopist: Ladene Artist , MD Age: 65 Referring MD:  Date of Birth: Sep 30, 1950 Gender: Female Account #: [id number] Procedure:                Colonoscopy Indications:              High risk colon cancer surveillance: Personal                            history of stage III colon cancer Medicines:                Monitored Anesthesia Care Procedure:                Pre-Anesthesia Assessment:                           - Prior to the procedure, a History and Physical                            was performed, and patient medications and                            allergies were reviewed. The patient's tolerance of                            previous anesthesia was also reviewed. The risks                            and benefits of the procedure and the sedation                            options and risks were discussed with the patient.                            All questions were answered, and informed consent                            was obtained. Prior Anticoagulants: The patient has                            taken no previous anticoagulant or antiplatelet                            agents. ASA Grade Assessment: II - A patient with                            mild systemic disease. After reviewing the risks                            and benefits, the patient was deemed in                            satisfactory condition to undergo the procedure.  After obtaining informed consent, the colonoscope                            was passed under direct vision. Throughout the                            procedure, the patient's blood pressure, pulse, and                            oxygen saturations were monitored continuously. The                            Model PCF-H190L [id number] scope was introduced                            through the anus and  advanced to the the cecum,                            identified by appendiceal orifice and ileocecal                            valve. The terminal ileum and the rectum were                            photographed. The quality of the bowel preparation                            was adequate after extensive lavage and suctioning.                            The colonoscopy was performed without difficulty.                            The patient tolerated the procedure well. Scope In: 11:22:39 AM Scope Out: 11:39:32 AM Scope Withdrawal Time: 0 hours 13 minutes 10 seconds  Total Procedure Duration: 0 hours 16 minutes 53 seconds  Findings:                 A 8 mm polyp was found in the transverse colon. The                            polyp was sessile. The polyp was removed with a                            cold snare. Resection and retrieval were complete.                           Internal hemorrhoids were found during                            retroflexion. The hemorrhoids were medium-sized and                            Grade I (internal hemorrhoids  that do not prolapse).                           There was evidence of a prior end-to-side                            ileo-colonic anastomosis in the ascending colon.                            Prior right colectomy. This was patent. The                            anastomosis was traversed.                           The perianal and digital rectal examinations were                            normal.                           The neo-terminal ileum appeared normal.                           The exam was otherwise normal throughout the                            examined colon. Complications:            No immediate complications. Estimated blood loss:                            None. Estimated Blood Loss:     Estimated blood loss: none. Impression:               - One 8 mm polyp in the transverse colon, removed                            with  a cold snare. Resected and retrieved.                           - Internal hemorrhoids.                           - Patent end-to-side ileo-colonic anastomosis.                            Prior right colectomy.                           - The examined portion of the ileum was normal. Recommendation:           - Repeat colonoscopy in 2 years for surveillance                            with a more extensive bowel prep.                           -  Patient has a contact number available for                            emergencies. The signs and symptoms of potential                            delayed complications were discussed with the                            patient. Return to normal activities tomorrow.                            Written discharge instructions were provided to the                            patient.                           - Resume previous diet.                           - Continue present medications.                           - Await pathology results. Ladene Artist, MD 09/15/2015 11:48:10 AM This report has been signed electronically.

## 2015-09-15 NOTE — Progress Notes (Signed)
Report to PACU, RN, vss, BBS= Clear.  

## 2015-09-15 NOTE — Progress Notes (Signed)
Called to room to assist during endoscopic procedure.  Patient ID and intended procedure confirmed with present staff. Received instructions for my participation in the procedure from the performing physician.  

## 2015-09-16 ENCOUNTER — Telehealth: Payer: Self-pay

## 2015-09-16 NOTE — Telephone Encounter (Signed)
  Follow up Call-  Call back number 09/15/2015  Post procedure Call Back phone  # [phone]  Permission to leave phone message Yes  Some recent data might be hidden     Patient questions:  Do you have a fever, pain , or abdominal swelling? No. Pain Score  0 *  Have you tolerated food without any problems? Yes.    Have you been able to return to your normal activities? Yes.    Do you have any questions about your discharge instructions: Diet   No. Medications  No. Follow up visit  No.  Do you have questions or concerns about your Care? No.  Actions: * If pain score is 4 or above: No action needed, pain <4.

## 2015-09-24 ENCOUNTER — Encounter: Payer: Self-pay | Admitting: Gastroenterology

## 2015-09-26 ENCOUNTER — Other Ambulatory Visit: Payer: Self-pay | Admitting: Internal Medicine

## 2015-09-26 DIAGNOSIS — E2839 Other primary ovarian failure: Secondary | ICD-10-CM

## 2015-10-20 ENCOUNTER — Other Ambulatory Visit: Payer: Medicare Other

## 2015-10-27 ENCOUNTER — Ambulatory Visit
Admission: RE | Admit: 2015-10-27 | Discharge: 2015-10-27 | Disposition: A | Discharge status: Home / Self Care | Payer: Medicare Other | Source: Ambulatory Visit | Attending: Internal Medicine | Admitting: Internal Medicine

## 2015-10-27 DIAGNOSIS — E2839 Other primary ovarian failure: Secondary | ICD-10-CM

## 2016-01-12 ENCOUNTER — Telehealth: Payer: Self-pay

## 2016-01-12 NOTE — Telephone Encounter (Signed)
Pt called to verify appts and drinking her contrast on 12/14.

## 2016-01-14 ENCOUNTER — Ambulatory Visit (HOSPITAL_COMMUNITY)
Admission: RE | Admit: 2016-01-14 | Discharge: 2016-01-14 | Disposition: A | Discharge status: Home / Self Care | Payer: Medicare Other | Source: Ambulatory Visit | Attending: Oncology | Admitting: Oncology

## 2016-01-14 ENCOUNTER — Other Ambulatory Visit (HOSPITAL_BASED_OUTPATIENT_CLINIC_OR_DEPARTMENT_OTHER): Payer: Medicare Other

## 2016-01-14 DIAGNOSIS — I2510 Atherosclerotic heart disease of native coronary artery without angina pectoris: Secondary | ICD-10-CM | POA: Diagnosis not present

## 2016-01-14 DIAGNOSIS — C182 Malignant neoplasm of ascending colon: Secondary | ICD-10-CM | POA: Insufficient documentation

## 2016-01-14 DIAGNOSIS — I700 Atherosclerosis of aorta: Secondary | ICD-10-CM | POA: Insufficient documentation

## 2016-01-14 DIAGNOSIS — Z85038 Personal history of other malignant neoplasm of large intestine: Secondary | ICD-10-CM

## 2016-01-14 DIAGNOSIS — D6959 Other secondary thrombocytopenia: Secondary | ICD-10-CM | POA: Diagnosis not present

## 2016-01-14 DIAGNOSIS — Z9049 Acquired absence of other specified parts of digestive tract: Secondary | ICD-10-CM | POA: Diagnosis not present

## 2016-01-14 LAB — BASIC METABOLIC PANEL
Anion Gap: 8 mEq/L (ref 3–11)
BUN: 16.8 mg/dL (ref 7.0–26.0)
CALCIUM: 9.3 mg/dL (ref 8.4–10.4)
CO2: 26 meq/L (ref 22–29)
CREATININE: 1.2 mg/dL — AB (ref 0.6–1.1)
Chloride: 108 mEq/L (ref 98–109)
EGFR: 54 mL/min/{1.73_m2} — ABNORMAL LOW (ref >90)
GLUCOSE: 92 mg/dL (ref 70–140)
Potassium: 4 mEq/L (ref 3.5–5.1)
Sodium: 141 mEq/L (ref 136–145)

## 2016-01-14 LAB — CEA (IN HOUSE-CHCC): CEA (CHCC-In House): 2.48 ng/mL (ref 0.00–5.00)

## 2016-01-14 MED ORDER — IOPAMIDOL (ISOVUE-300) INJECTION 61%
100.0000 mL | Freq: Once | INTRAVENOUS | Status: AC | PRN
  Administered 2016-01-14: 100 mL via INTRAVENOUS

## 2016-01-15 LAB — CEA (PARALLEL TESTING): CEA: 1.7 ng/mL

## 2016-01-18 ENCOUNTER — Ambulatory Visit (HOSPITAL_BASED_OUTPATIENT_CLINIC_OR_DEPARTMENT_OTHER): Payer: Medicare Other | Admitting: Nurse Practitioner

## 2016-01-18 ENCOUNTER — Telehealth: Payer: Self-pay | Admitting: Nurse Practitioner

## 2016-01-18 ENCOUNTER — Other Ambulatory Visit: Payer: Medicare Other

## 2016-01-18 VITALS — BP 146/83 | HR 66 | Temp 97.8°F | Resp 18 | Ht 63.0 in | Wt 179.7 lb

## 2016-01-18 DIAGNOSIS — G620 Drug-induced polyneuropathy: Secondary | ICD-10-CM | POA: Diagnosis not present

## 2016-01-18 DIAGNOSIS — I10 Essential (primary) hypertension: Secondary | ICD-10-CM

## 2016-01-18 DIAGNOSIS — C189 Malignant neoplasm of colon, unspecified: Secondary | ICD-10-CM

## 2016-01-18 DIAGNOSIS — Z85038 Personal history of other malignant neoplasm of large intestine: Secondary | ICD-10-CM

## 2016-01-18 NOTE — Telephone Encounter (Signed)
Appointments scheduled per 12/18 LOS. Patient given AVS report and calendars with future scheduled appointments.  °

## 2016-01-18 NOTE — Progress Notes (Signed)
  [facility] OFFICE PROGRESS NOTE   Diagnosis:  Colon cancer  INTERVAL HISTORY:   Gail Carlson returns as scheduled. She feels well. No change in bowel habits. No abdominal pain. No nausea or vomiting. She reports a good appetite. She continues to have intermittent left shoulder pain mainly at nighttime. She takes hydrocodone as needed.  Objective:  Vital signs in last 24 hours:  Blood pressure (!) 146/83, pulse 66, temperature 97.8 F (36.6 C), temperature source Oral, resp. rate 18, height 5' 3" (1.6 m), weight 179 lb 11.2 oz (81.5 kg), SpO2 100 %.    HEENT: No neck mass. Lymphatics: No palpable cervical, supra clavicular, axillary or inguinal lymph nodes. Resp: Lungs clear bilaterally. Cardio: Regular rate and rhythm. GI: Abdomen soft and nontender. No hepatomegaly. No mass. Question reducible incisional hernia left upper abdomen. Vascular: No leg edema.    Lab Results:  Lab Results  Component Value Date   WBC 5.2 01/05/2015   HGB 13.9 01/05/2015   HCT 42.8 01/05/2015   MCV 93.5 01/05/2015   PLT 240 01/05/2015   NEUTROABS 3.0 01/05/2015    Imaging:  No results found.  Medications: I have reviewed the patient's current medications.  Assessment/Plan: 1. 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 ? CTs of the chest, abdomen, and pelvis on 01/10/2014 with a cecal mass, nonspecific small liver lesions, and nonspecific lung nodules felt to most likely represent mucoid impaction ? Cycle 1 FOLFOX 02/20/2014 ? Cycle 2 FOLFOX 03/06/2014 ? Cycle 3 FOLFOX 03/20/2014 ? Cycle 4 FOLFOX 04/03/2014 ? Cycle 5 FOLFOX held on 04/17/2014 due to neutropenia ? Cycle 5 FOLFOX 04/24/2014 with Neulasta support ? Cycle 6 FOLFOX 05/08/2014 ? Cycle 7 FOLFOX 05/22/2014 with Neulasta support ? Cycle 8 FOLFOX  06/05/2014 (oxaliplatin held secondary to thrombocytopenia) ? Cycle 9 FOLFOX 06/19/2014 with Neulasta support ? Cycle 10 FOLFOX 07/03/2014 (oxaliplatin held secondary to thrombocytopenia) ? Cycle 11 FOLFOX 07/17/2014 ? Cycle 12 FOLFOX 07/31/2014 (oxaliplatin dose reduced due to thrombocytopenia ? CTs chest, abdomen, and pelvis on 01/05/2015-no evidence of recurrent disease ? CTs chest, abdomen, pelvis on 01/14/2016-no evidence of recurrent disease  2. History of Iron deficiency anemia 3. Family history of breast cancer 4. Port-A-Cath placement 02/18/2014 5. Delayed nausea following cycle 1 FOLFOX. Aloxi and Emend added beginning with cycle 2. Prophylactic Decadron added beginning with cycle 3. 6. Neutropenia following cycle 4 FOLFOX. Neulasta was added with cycle 5. 7. Hypokalemia 04/17/2014. Question secondary to diarrhea. Potassium supplement initiated. 8. Left upper arm pain. Negative venous Doppler 03/07/2014. She was referred to orthopedics. 9. Thrombocytopenia secondary to chemotherapy 10. Mild oxaliplatin neuropathy 11. Hypertension  12. 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     Right facial cellulitis 07/15/2015-treated with doxycycline   Disposition: Gail Carlson remains in clinical remission from colon cancer. She will return for a follow-up visit and CEA in 6 months. She will contact the office in the interim with any problems.    Ned Card ANP/GNP-BC   01/18/2016  11:19 AM

## 2016-01-26 ENCOUNTER — Other Ambulatory Visit: Payer: Self-pay | Admitting: Nurse Practitioner

## 2016-03-22 ENCOUNTER — Other Ambulatory Visit: Payer: Self-pay | Admitting: Oncology

## 2016-03-22 DIAGNOSIS — C189 Malignant neoplasm of colon, unspecified: Secondary | ICD-10-CM

## 2016-03-22 MED ORDER — ONDANSETRON 8 MG PO TBDP: 8.0000 mg | ORAL_TABLET | Freq: Three times a day (TID) | ORAL | 1 refills | Status: DC | PRN

## 2016-03-24 ENCOUNTER — Ambulatory Visit: Payer: Medicare HMO | Admitting: Podiatry

## 2016-05-24 ENCOUNTER — Other Ambulatory Visit: Payer: Self-pay | Admitting: Internal Medicine

## 2016-05-24 DIAGNOSIS — Z1231 Encounter for screening mammogram for malignant neoplasm of breast: Secondary | ICD-10-CM

## 2016-06-13 ENCOUNTER — Ambulatory Visit
Admission: RE | Admit: 2016-06-13 | Discharge: 2016-06-13 | Disposition: A | Discharge status: Home / Self Care | Payer: Medicare HMO | Source: Ambulatory Visit | Attending: Internal Medicine | Admitting: Internal Medicine

## 2016-06-13 DIAGNOSIS — Z1231 Encounter for screening mammogram for malignant neoplasm of breast: Secondary | ICD-10-CM

## 2016-07-19 ENCOUNTER — Ambulatory Visit: Payer: Medicare Other | Admitting: Oncology

## 2016-07-19 ENCOUNTER — Other Ambulatory Visit: Payer: Medicare Other

## 2016-08-01 ENCOUNTER — Other Ambulatory Visit: Payer: Medicare HMO

## 2016-08-01 ENCOUNTER — Ambulatory Visit (HOSPITAL_BASED_OUTPATIENT_CLINIC_OR_DEPARTMENT_OTHER): Payer: Medicare HMO | Admitting: Oncology

## 2016-08-01 VITALS — BP 170/80 | HR 63 | Temp 98.7°F | Resp 18 | Ht 63.0 in | Wt 181.7 lb

## 2016-08-01 DIAGNOSIS — Z85038 Personal history of other malignant neoplasm of large intestine: Secondary | ICD-10-CM

## 2016-08-01 DIAGNOSIS — C189 Malignant neoplasm of colon, unspecified: Secondary | ICD-10-CM

## 2016-08-01 DIAGNOSIS — I10 Essential (primary) hypertension: Secondary | ICD-10-CM

## 2016-08-01 LAB — CEA (IN HOUSE-CHCC): CEA (CHCC-IN HOUSE): 2.19 ng/mL (ref 0.00–5.00)

## 2016-08-01 NOTE — Progress Notes (Signed)
[facility] OFFICE PROGRESS NOTE   Diagnosis: Colon cancer  INTERVAL HISTORY:   Gail Carlson returns as scheduled. She feels well. Good appetite. No difficulty with bowel function. She has left shoulder discomfort and was diagnosed with arthritis.  Objective:  Vital signs in last 24 hours:  Blood pressure (!) 180/82, pulse 63, temperature 98.7 F (37.1 C), temperature source Oral, resp. rate 18, height '5\' 3"'  (1.6 m), weight 181 lb 11.2 oz (82.4 kg), SpO2 100 %.    HEENT: Neck without mass Lymphatics: No cervical, supraclavicular, axillary, or inguinal nodes Resp: Lungs clear bilaterally Cardio: Regular rate and rhythm GI: No hepatomegaly, no mass, nontender, mild upper abdominal hernia Vascular: No leg edema   Lab Results:  Lab Results  Component Value Date   WBC 5.2 01/05/2015   HGB 13.9 01/05/2015   HCT 42.8 01/05/2015   MCV 93.5 01/05/2015   PLT 240 01/05/2015   NEUTROABS 3.0 01/05/2015    CMP     Component Value Date/Time   NA 141 01/14/2016 1118   K 4.0 01/14/2016 1118   CL 102 01/16/2014 0414   CO2 26 01/14/2016 1118   GLUCOSE 92 01/14/2016 1118   BUN 16.8 01/14/2016 1118   CREATININE 1.2 (H) 01/14/2016 1118   CALCIUM 9.3 01/14/2016 1118   PROT 7.3 01/05/2015 0815   ALBUMIN 4.0 01/05/2015 0815   AST 16 01/05/2015 0815   ALT 9 01/05/2015 0815   ALKPHOS 108 01/05/2015 0815   BILITOT <0.30 01/05/2015 0815   GFRNONAA 59 (L) 01/16/2014 0414   GFRAA 69 (L) 01/16/2014 0414    Lab Results  Component Value Date   CEA1 2.48 01/14/2016    Medications: I have reviewed the patient's current medications.  Assessment/Plan: 1. 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 ? CTs of the chest, abdomen, and pelvis on 01/10/2014 with a cecal mass, nonspecific small liver lesions, and  nonspecific lung nodules felt to most likely represent mucoid impaction ? Cycle 1 FOLFOX 02/20/2014 ? Cycle 2 FOLFOX 03/06/2014 ? Cycle 3 FOLFOX 03/20/2014 ? Cycle 4 FOLFOX 04/03/2014 ? Cycle 5 FOLFOX held on 04/17/2014 due to neutropenia ? Cycle 5 FOLFOX 04/24/2014 with Neulasta support ? Cycle 6 FOLFOX 05/08/2014 ? Cycle 7 FOLFOX 05/22/2014 with Neulasta support ? Cycle 8 FOLFOX 06/05/2014 (oxaliplatin held secondary to thrombocytopenia) ? Cycle 9 FOLFOX 06/19/2014 with Neulasta support ? Cycle 10 FOLFOX 07/03/2014 (oxaliplatin held secondary to thrombocytopenia) ? Cycle 11 FOLFOX 07/17/2014 ? Cycle 12 FOLFOX 07/31/2014 (oxaliplatin dose reduced due to thrombocytopenia ? CTs chest, abdomen, and pelvis on 01/05/2015-no evidence of recurrent disease ? Colonoscopy 09/15/2015-tubular adenoma removed from the transverse colon ? CTs chest, abdomen, pelvis on 01/14/2016-no evidence of recurrent disease  2. History of Iron deficiency anemia 3. Family history of breast cancer 4. Port-A-Cath placement 02/18/2014 5. Delayed nausea following cycle 1 FOLFOX. Aloxi and Emend added beginning with cycle 2. Prophylactic Decadron added beginning with cycle 3. 6. Neutropenia following cycle 4 FOLFOX. Neulasta was added with cycle 5. 7. Hypokalemia 04/17/2014. Question secondary to diarrhea. Potassium supplement initiated. 8. Left upper arm pain. Negative venous Doppler 03/07/2014. She was referred to orthopedics. 9. Thrombocytopenia secondary to chemotherapy 10. Mild oxaliplatin neuropathy 11. Hypertension  12. 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 Right facial cellulitis 07/15/2015-treated with doxycycline     Disposition:  Gail Carlson remains in clinical remission from colon cancer. She will discontinue iron. She will return for an office visit and surveillance CTs in 6 months.  She plans to schedule an  appointment with Dr. [name] to discuss repair of the abdominal hernia.  15 minutes were spent with the patient today. The majority of the time was used for counseling and coordination of care.  Donneta Romberg, MD  08/01/2016  12:19 PM

## 2016-08-02 LAB — CEA (PARALLEL TESTING): CEA: 1.7 ng/mL

## 2016-08-03 ENCOUNTER — Telehealth: Payer: Self-pay | Admitting: *Deleted

## 2016-08-03 NOTE — Telephone Encounter (Signed)
Left message for return call to discuss lab results as directed below

## 2016-08-03 NOTE — Telephone Encounter (Signed)
-----   Message from Ladell Pier, MD sent at 08/02/2016  9:42 PM EDT ----- Please call patient, Gail Carlson is normal

## 2016-08-04 ENCOUNTER — Telehealth: Payer: Self-pay | Admitting: Oncology

## 2016-08-04 ENCOUNTER — Encounter: Payer: Self-pay | Admitting: *Deleted

## 2016-08-04 NOTE — Telephone Encounter (Signed)
Patient bypassed scheduling on 08/01/16. Appointments scheduled per, 08/01/16 los. Message left on voicemail with appointment info. Patient was mailed an appointment letter and copy of the appointment schedule.

## 2016-08-05 ENCOUNTER — Telehealth: Payer: Self-pay

## 2016-08-05 NOTE — Telephone Encounter (Signed)
Call placed to follow-up with pt on requested lab results. Per MD Benay Spice, pt's lab work is within normal range. Pt is appreciative of call back.

## 2016-09-14 ENCOUNTER — Ambulatory Visit: Payer: Self-pay | Admitting: Surgery

## 2016-10-14 ENCOUNTER — Encounter (HOSPITAL_COMMUNITY): Payer: Medicare HMO

## 2016-10-18 ENCOUNTER — Inpatient Hospital Stay (HOSPITAL_COMMUNITY): Admission: RE | Admit: 2016-10-18 | Payer: Medicare HMO | Source: Ambulatory Visit

## 2016-10-21 ENCOUNTER — Encounter (HOSPITAL_COMMUNITY): Admission: RE | Payer: Self-pay | Source: Ambulatory Visit

## 2016-10-21 ENCOUNTER — Inpatient Hospital Stay (HOSPITAL_COMMUNITY): Admission: RE | Admit: 2016-10-21 | Payer: Medicare HMO | Source: Ambulatory Visit | Admitting: Surgery

## 2016-10-21 SURGERY — REPAIR, HERNIA, INCISIONAL, LAPAROSCOPIC
Anesthesia: General

## 2016-11-28 ENCOUNTER — Telehealth: Payer: Self-pay | Admitting: *Deleted

## 2016-11-28 DIAGNOSIS — C189 Malignant neoplasm of colon, unspecified: Secondary | ICD-10-CM

## 2016-11-28 MED ORDER — ONDANSETRON 8 MG PO TBDP: 8.0000 mg | ORAL_TABLET | Freq: Three times a day (TID) | ORAL | 1 refills | Status: DC | PRN

## 2016-11-28 NOTE — Telephone Encounter (Signed)
Zofran refilled 

## 2016-11-28 NOTE — Telephone Encounter (Signed)
Received fax from pharmacy for Zofran refill. Left message on voicemail for pt to call office. Is she having new symptoms?

## 2016-11-28 NOTE — Telephone Encounter (Signed)
She is not feeling any new symptoms. Some mornings she wakes up with nausea and still uses them 2-3 x/week. Her bowels are moving well, she urinates good, she has a good appetite.   Next OV 01/16/17

## 2017-01-12 ENCOUNTER — Encounter (HOSPITAL_COMMUNITY): Payer: Self-pay

## 2017-01-12 ENCOUNTER — Ambulatory Visit (HOSPITAL_COMMUNITY)
Admission: RE | Admit: 2017-01-12 | Discharge: 2017-01-12 | Disposition: A | Discharge status: Home / Self Care | Payer: Medicare HMO | Source: Ambulatory Visit | Attending: Oncology | Admitting: Oncology

## 2017-01-12 ENCOUNTER — Other Ambulatory Visit (HOSPITAL_BASED_OUTPATIENT_CLINIC_OR_DEPARTMENT_OTHER): Payer: Medicare HMO

## 2017-01-12 DIAGNOSIS — M47815 Spondylosis without myelopathy or radiculopathy, thoracolumbar region: Secondary | ICD-10-CM | POA: Insufficient documentation

## 2017-01-12 DIAGNOSIS — M5136 Other intervertebral disc degeneration, lumbar region: Secondary | ICD-10-CM | POA: Diagnosis not present

## 2017-01-12 DIAGNOSIS — I77811 Abdominal aortic ectasia: Secondary | ICD-10-CM | POA: Insufficient documentation

## 2017-01-12 DIAGNOSIS — I2510 Atherosclerotic heart disease of native coronary artery without angina pectoris: Secondary | ICD-10-CM | POA: Diagnosis not present

## 2017-01-12 DIAGNOSIS — Z85038 Personal history of other malignant neoplasm of large intestine: Secondary | ICD-10-CM

## 2017-01-12 DIAGNOSIS — C189 Malignant neoplasm of colon, unspecified: Secondary | ICD-10-CM

## 2017-01-12 DIAGNOSIS — I700 Atherosclerosis of aorta: Secondary | ICD-10-CM | POA: Insufficient documentation

## 2017-01-12 DIAGNOSIS — D739 Disease of spleen, unspecified: Secondary | ICD-10-CM | POA: Diagnosis not present

## 2017-01-12 DIAGNOSIS — N261 Atrophy of kidney (terminal): Secondary | ICD-10-CM | POA: Diagnosis not present

## 2017-01-12 DIAGNOSIS — K769 Liver disease, unspecified: Secondary | ICD-10-CM | POA: Diagnosis not present

## 2017-01-12 DIAGNOSIS — K449 Diaphragmatic hernia without obstruction or gangrene: Secondary | ICD-10-CM | POA: Insufficient documentation

## 2017-01-12 LAB — CBC WITH DIFFERENTIAL/PLATELET
BASO%: 1 % (ref 0.0–2.0)
BASOS ABS: 0.1 10*3/uL (ref 0.0–0.1)
EOS%: 1.6 % (ref 0.0–7.0)
Eosinophils Absolute: 0.1 10*3/uL (ref 0.0–0.5)
HCT: 42.2 % (ref 34.8–46.6)
HEMOGLOBIN: 13.8 g/dL (ref 11.6–15.9)
LYMPH%: 21.8 % (ref 14.0–49.7)
MCH: 29.3 pg (ref 25.1–34.0)
MCHC: 32.6 g/dL (ref 31.5–36.0)
MCV: 89.8 fL (ref 79.5–101.0)
MONO#: 0.3 10*3/uL (ref 0.1–0.9)
MONO%: 5.5 % (ref 0.0–14.0)
NEUT#: 3.7 10*3/uL (ref 1.5–6.5)
NEUT%: 70.1 % (ref 38.4–76.8)
Platelets: 248 10*3/uL (ref 145–400)
RBC: 4.7 10*6/uL (ref 3.70–5.45)
RDW: 14.3 % (ref 11.2–14.5)
WBC: 5.3 10*3/uL (ref 3.9–10.3)
lymph#: 1.2 10*3/uL (ref 0.9–3.3)

## 2017-01-12 LAB — BASIC METABOLIC PANEL
ANION GAP: 8 meq/L (ref 3–11)
BUN: 11.4 mg/dL (ref 7.0–26.0)
CALCIUM: 9.3 mg/dL (ref 8.4–10.4)
CO2: 28 meq/L (ref 22–29)
Chloride: 104 mEq/L (ref 98–109)
Creatinine: 1.3 mg/dL — ABNORMAL HIGH (ref 0.6–1.1)
EGFR: 47 mL/min/{1.73_m2} — AB (ref >60)
Glucose: 88 mg/dl (ref 70–140)
Potassium: 4 mEq/L (ref 3.5–5.1)
SODIUM: 140 meq/L (ref 136–145)

## 2017-01-12 LAB — CEA (IN HOUSE-CHCC): CEA (CHCC-IN HOUSE): 2.84 ng/mL (ref 0.00–5.00)

## 2017-01-12 MED ORDER — IOPAMIDOL (ISOVUE-300) INJECTION 61%
100.0000 mL | Freq: Once | INTRAVENOUS | Status: AC | PRN
  Administered 2017-01-12: 100 mL via INTRAVENOUS

## 2017-01-12 MED ORDER — IOPAMIDOL (ISOVUE-300) INJECTION 61%
INTRAVENOUS | Status: AC
  Filled 2017-01-12: qty 100

## 2017-01-13 ENCOUNTER — Telehealth: Payer: Self-pay | Admitting: *Deleted

## 2017-01-13 NOTE — Telephone Encounter (Signed)
-----   Message from Ladell Pier, MD sent at 01/13/2017  6:10 PM EST ----- Please call patient, CTs negative for cancer, f/u as scheduled

## 2017-01-13 NOTE — Telephone Encounter (Signed)
Called pt with CT result, per MD note below. She voiced appreciation for call.

## 2017-01-16 ENCOUNTER — Encounter: Payer: Self-pay | Admitting: Nurse Practitioner

## 2017-01-16 ENCOUNTER — Ambulatory Visit (HOSPITAL_BASED_OUTPATIENT_CLINIC_OR_DEPARTMENT_OTHER): Payer: Medicare HMO | Admitting: Nurse Practitioner

## 2017-01-16 VITALS — BP 174/89 | HR 64 | Temp 98.4°F | Resp 18 | Wt 184.4 lb

## 2017-01-16 DIAGNOSIS — D509 Iron deficiency anemia, unspecified: Secondary | ICD-10-CM | POA: Diagnosis not present

## 2017-01-16 DIAGNOSIS — Z85038 Personal history of other malignant neoplasm of large intestine: Secondary | ICD-10-CM | POA: Diagnosis not present

## 2017-01-16 DIAGNOSIS — C189 Malignant neoplasm of colon, unspecified: Secondary | ICD-10-CM

## 2017-01-16 NOTE — Progress Notes (Addendum)
  [facility] OFFICE PROGRESS NOTE   Diagnosis: Colon cancer  INTERVAL HISTORY:   Gail Carlson returns as scheduled.  She feels well.  No change in bowel habits.  No bloody or black stools.  She has a good appetite and good energy level.  No abdominal pain.  Objective:  Vital signs in last 24 hours:  Blood pressure (!) 174/89, pulse 64, temperature 98.4 F (36.9 C), temperature source Oral, resp. rate 18, weight 184 lb 6.4 oz (83.6 kg), SpO2 100 %.    HEENT: Neck without mass. Lymphatics: No palpable cervical, supraclavicular, axillary or inguinal lymph nodes. Resp: Lungs clear bilaterally. Cardio: Regular rate and rhythm. GI: Abdomen soft and nontender.  No hepatomegaly.  Mid upper abdominal hernia. Vascular: No leg edema.   Lab Results:  Lab Results  Component Value Date   WBC 5.3 01/12/2017   HGB 13.8 01/12/2017   HCT 42.2 01/12/2017   MCV 89.8 01/12/2017   PLT 248 01/12/2017   NEUTROABS 3.7 01/12/2017    Imaging:  No results found.  Medications: I have reviewed the patient's current medications.  Assessment/Plan: 1. 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 ? CTs of the chest, abdomen, and pelvis on 01/10/2014 with a cecal mass, nonspecific small liver lesions, and nonspecific lung nodules felt to most likely represent mucoid impaction ? Cycle 1 FOLFOX 02/20/2014 ? Cycle 2 FOLFOX 03/06/2014 ? Cycle 3 FOLFOX 03/20/2014 ? Cycle 4 FOLFOX 04/03/2014 ? Cycle 5 FOLFOX held on 04/17/2014 due to neutropenia ? Cycle 5 FOLFOX 04/24/2014 with Neulasta support ? Cycle 6 FOLFOX 05/08/2014 ? Cycle 7 FOLFOX 05/22/2014 with Neulasta support ? Cycle 8 FOLFOX 06/05/2014 (oxaliplatin held secondary to thrombocytopenia) ? Cycle 9 FOLFOX 06/19/2014 with Neulasta support ? Cycle 10 FOLFOX 07/03/2014  (oxaliplatin held secondary to thrombocytopenia) ? Cycle 11 FOLFOX 07/17/2014 ? Cycle 12 FOLFOX 07/31/2014 (oxaliplatin dose reduced due to thrombocytopenia ? CTs chest, abdomen, and pelvis on 01/05/2015-no evidence of recurrent disease ? Colonoscopy 09/15/2015-tubular adenoma removed from the transverse colon ? CTs chest, abdomen, pelvis on 01/14/2016-no evidence of recurrent disease ? CTs 01/12/2017-no evidence of recurrent cancer.  2. History of Iron deficiency anemia 3. Family history of breast cancer 4. Port-A-Cath placement 02/18/2014 5. Delayed nausea following cycle 1 FOLFOX. Aloxi and Emend added beginning with cycle 2. Prophylactic Decadron added beginning with cycle 3. 6. Neutropenia following cycle 4 FOLFOX. Neulasta was added with cycle 5. 7. Hypokalemia 04/17/2014. Question secondary to diarrhea. Potassium supplement initiated. 8. Left upper arm pain. Negative venous Doppler 03/07/2014. She was referred to orthopedics. 9. Thrombocytopenia secondary to chemotherapy 10. Mild oxaliplatin neuropathy 11. Hypertension  12. 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 Right facial cellulitis 07/15/2015-treated with doxycycline     Disposition: Gail Carlson remains in clinical remission from colon cancer.  She will return for a follow-up visit and CEA in 6 months.  She will contact the office in the interim with any problems.  Patient seen with Dr. Benay Spice.    Ned Card ANP/GNP-BC   01/16/2017  11:18 AM This was a shared visit with Ned Card.  Gail Carlson is in clinical and radiologic remission from colon cancer.  She will return for an office visit and CEA in 6 months.  Gail Manson, MD

## 2017-01-17 ENCOUNTER — Telehealth: Payer: Self-pay | Admitting: Oncology

## 2017-01-17 NOTE — Telephone Encounter (Signed)
Scheduled appt per 12/17 los - left voicemail for patient and sending confirmation letter in the mail.

## 2017-04-21 ENCOUNTER — Telehealth: Payer: Self-pay | Admitting: *Deleted

## 2017-04-21 NOTE — Telephone Encounter (Signed)
Received prior auth request for Zofran. Called pt, she reports occasional brief bouts of nausea. Instructed her to use OTC nausea med as needed. Call for persistent nausea. She voiced understanding.

## 2017-05-15 ENCOUNTER — Other Ambulatory Visit: Payer: Self-pay | Admitting: Internal Medicine

## 2017-05-15 DIAGNOSIS — Z1231 Encounter for screening mammogram for malignant neoplasm of breast: Secondary | ICD-10-CM

## 2017-06-19 ENCOUNTER — Telehealth: Payer: Self-pay | Admitting: Oncology

## 2017-06-19 NOTE — Telephone Encounter (Signed)
Spoke with pt. Rescheduled to from 6/18 to 08/01/2017 due to provider schedule.

## 2017-06-27 ENCOUNTER — Ambulatory Visit: Payer: Medicare HMO

## 2017-07-17 ENCOUNTER — Ambulatory Visit
Admission: RE | Admit: 2017-07-17 | Discharge: 2017-07-17 | Disposition: A | Discharge status: Home / Self Care | Payer: Medicare HMO | Source: Ambulatory Visit | Attending: Internal Medicine | Admitting: Internal Medicine

## 2017-07-17 DIAGNOSIS — Z1231 Encounter for screening mammogram for malignant neoplasm of breast: Secondary | ICD-10-CM

## 2017-07-18 ENCOUNTER — Ambulatory Visit: Payer: Medicare HMO | Admitting: Oncology

## 2017-07-18 ENCOUNTER — Other Ambulatory Visit: Payer: Medicare HMO

## 2017-07-24 ENCOUNTER — Encounter: Payer: Self-pay | Admitting: Gastroenterology

## 2017-08-01 ENCOUNTER — Inpatient Hospital Stay (HOSPITAL_BASED_OUTPATIENT_CLINIC_OR_DEPARTMENT_OTHER): Payer: Medicare HMO | Admitting: Oncology

## 2017-08-01 ENCOUNTER — Inpatient Hospital Stay: Payer: Medicare HMO | Attending: Oncology

## 2017-08-01 ENCOUNTER — Telehealth: Payer: Self-pay

## 2017-08-01 VITALS — BP 166/81 | HR 66 | Temp 98.8°F | Resp 17 | Ht 63.0 in | Wt 181.8 lb

## 2017-08-01 DIAGNOSIS — C189 Malignant neoplasm of colon, unspecified: Secondary | ICD-10-CM

## 2017-08-01 LAB — CEA (IN HOUSE-CHCC): CEA (CHCC-In House): 2.34 ng/mL (ref 0.00–5.00)

## 2017-08-01 NOTE — Progress Notes (Signed)
[facility] OFFICE PROGRESS NOTE   Diagnosis: Colon cancer  INTERVAL HISTORY:   Gail Carlson returns as scheduled.  She feels well.  She has decided against surgery for the abdominal hernia.  No difficulty with bowel function.  Objective:  Vital signs in last 24 hours:  Blood pressure (!) 166/81, pulse 66, temperature 98.8 F (37.1 C), temperature source Oral, resp. rate 17, height '5\' 3"'  (1.6 m), weight 181 lb 12.8 oz (82.5 kg), SpO2 100 %.    HEENT: Neck without mass Lymphatics: No cervical, supraclavicular, axillary, or inguinal nodes Resp: Clear bilaterally Cardio: Regular rate and rhythm GI: No hepatomegaly, no mass, nontender.  Soft reducible left mid abdominal hernia Vascular: No leg edema  Lab Results:  Lab Results  Component Value Date   WBC 5.3 01/12/2017   HGB 13.8 01/12/2017   HCT 42.2 01/12/2017   MCV 89.8 01/12/2017   PLT 248 01/12/2017   NEUTROABS 3.7 01/12/2017    CMP  Lab Results  Component Value Date   NA 140 01/12/2017   K 4.0 01/12/2017   CL 102 01/16/2014   CO2 28 01/12/2017   GLUCOSE 88 01/12/2017   BUN 11.4 01/12/2017   CREATININE 1.3 (H) 01/12/2017   CALCIUM 9.3 01/12/2017   PROT 7.3 01/05/2015   ALBUMIN 4.0 01/05/2015   AST 16 01/05/2015   ALT 9 01/05/2015   ALKPHOS 108 01/05/2015   BILITOT <0.30 01/05/2015   GFRNONAA 59 (L) 01/16/2014   GFRAA 69 (L) 01/16/2014    Lab Results  Component Value Date   CEA1 2.84 01/12/2017     Medications: I have reviewed the patient's current medications.   Assessment/Plan: 1. 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 ? CTs of the chest, abdomen, and pelvis on 01/10/2014 with a cecal mass, nonspecific small liver lesions, and nonspecific lung nodules felt to most likely represent mucoid impaction ? Cycle 1 FOLFOX  02/20/2014 ? Cycle 2 FOLFOX 03/06/2014 ? Cycle 3 FOLFOX 03/20/2014 ? Cycle 4 FOLFOX 04/03/2014 ? Cycle 5 FOLFOX held on 04/17/2014 due to neutropenia ? Cycle 5 FOLFOX 04/24/2014 with Neulasta support ? Cycle 6 FOLFOX 05/08/2014 ? Cycle 7 FOLFOX 05/22/2014 with Neulasta support ? Cycle 8 FOLFOX 06/05/2014 (oxaliplatin held secondary to thrombocytopenia) ? Cycle 9 FOLFOX 06/19/2014 with Neulasta support ? Cycle 10 FOLFOX 07/03/2014 (oxaliplatin held secondary to thrombocytopenia) ? Cycle 11 FOLFOX 07/17/2014 ? Cycle 12 FOLFOX 07/31/2014 (oxaliplatin dose reduced due to thrombocytopenia ? CTs chest, abdomen, and pelvis on 01/05/2015-no evidence of recurrent disease ? Colonoscopy 09/15/2015-tubular adenoma removed from the transverse colon ? CTs chest, abdomen, pelvis on 01/14/2016-no evidence of recurrent disease ? CTs 01/12/2017-no evidence of recurrent cancer.  2. History of Iron deficiency anemia 3. Family history of breast cancer 4. Port-A-Cath placement 02/18/2014 5. Delayed nausea following cycle 1 FOLFOX. Aloxi and Emend added beginning with cycle 2. Prophylactic Decadron added beginning with cycle 3. 6. Neutropenia following cycle 4 FOLFOX. Neulasta was added with cycle 5. 7. Hypokalemia 04/17/2014. Question secondary to diarrhea. Potassium supplement initiated. 8. Left upper arm pain. Negative venous Doppler 03/07/2014. She was referred to orthopedics. 9. Thrombocytopenia secondary to chemotherapy 10. Mild oxaliplatin neuropathy 11. Hypertension  12. 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 Right facial cellulitis 07/15/2015-treated with doxycycline    Disposition: Gail Carlson remains in clinical remission from  colon cancer.  We will follow-up on the CEA from today.  She is due for a colonoscopy within the next few months.  She will return for an office visit and CEA in 6 months.  15 minutes were  spent with the patient today.  The majority of the time was used for counseling and coordination of care.  [name], MD  08/01/2017  12:22 PM

## 2017-08-01 NOTE — Telephone Encounter (Signed)
Printed avs and calender of upcoming appointment per 7/2 los

## 2017-08-01 NOTE — Telephone Encounter (Addendum)
Pt voiced understanding of message below     ----- Message from Ladell Pier, MD sent at 08/01/2017  2:26 PM EDT ----- Please call patient, the CEA is normal, follow-up as scheduled

## 2017-08-10 ENCOUNTER — Encounter: Payer: Self-pay | Admitting: Gastroenterology

## 2017-08-22 ENCOUNTER — Telehealth: Payer: Self-pay | Admitting: Oncology

## 2017-08-22 NOTE — Telephone Encounter (Signed)
Six month lab/fu for 02/01/18 was scheduled as follows:  f/u at 9:45 am and lab at 10:15 am. Appointments times reversed so lab can be prior to f/u at 9:45 and f/u changed to 10:15 am.

## 2017-10-18 ENCOUNTER — Encounter: Payer: Self-pay | Admitting: Gastroenterology

## 2017-10-18 ENCOUNTER — Ambulatory Visit (AMBULATORY_SURGERY_CENTER): Payer: Self-pay | Admitting: *Deleted

## 2017-10-18 VITALS — Ht 63.0 in | Wt 181.0 lb

## 2017-10-18 DIAGNOSIS — Z85038 Personal history of other malignant neoplasm of large intestine: Secondary | ICD-10-CM

## 2017-10-18 MED ORDER — BISACODYL 5 MG PO TBEC: 5.0000 mg | DELAYED_RELEASE_TABLET | Freq: Once | ORAL | 0 refills | Status: AC

## 2017-10-18 MED ORDER — NA SULFATE-K SULFATE-MG SULF 17.5-3.13-1.6 GM/177ML PO SOLN: 1.0000 | Freq: Once | ORAL | 0 refills | Status: AC

## 2017-10-18 MED ORDER — POLYETHYLENE GLYCOL 3350 17 GM/SCOOP PO POWD: 1.0000 | Freq: Every day | ORAL | 0 refills | Status: DC

## 2017-10-18 NOTE — Progress Notes (Signed)
No egg or soy allergy known to patient  No issues with past sedation with any surgeries  or procedures, no intubation problems  No diet pills per patient No home 02 use per patient  No blood thinners per patient  Pt states issues with constipation at time- she uses laxative prn- 2 day prep per Dr Fuller Plan  No A fib or A flutter   EMMI video sent to pt's e mail - pt has no e mail

## 2017-10-19 ENCOUNTER — Telehealth: Payer: Self-pay | Admitting: Gastroenterology

## 2017-10-19 NOTE — Telephone Encounter (Signed)
Pt called and states her Suprep is 99$ and she cannot no way afford that - she asked is there anything she can do- she did get the Miralax, Dulcolax and Gatorade already - asked pt if she can pick up a sample of SUprep- she can tomorrow Friday 9-20- placed at the 3rd floor desk - pt very grateful for the sample   Lot [id number]  Exp 8/21 as directed

## 2017-10-25 ENCOUNTER — Ambulatory Visit (AMBULATORY_SURGERY_CENTER): Payer: Medicare HMO | Admitting: Gastroenterology

## 2017-10-25 ENCOUNTER — Encounter: Payer: Self-pay | Admitting: Gastroenterology

## 2017-10-25 VITALS — BP 143/71 | HR 65 | Temp 98.4°F | Resp 16 | Ht 63.0 in | Wt 181.0 lb

## 2017-10-25 DIAGNOSIS — Z85038 Personal history of other malignant neoplasm of large intestine: Secondary | ICD-10-CM | POA: Diagnosis present

## 2017-10-25 DIAGNOSIS — C180 Malignant neoplasm of cecum: Secondary | ICD-10-CM

## 2017-10-25 DIAGNOSIS — C189 Malignant neoplasm of colon, unspecified: Secondary | ICD-10-CM

## 2017-10-25 DIAGNOSIS — K633 Ulcer of intestine: Secondary | ICD-10-CM

## 2017-10-25 MED ORDER — SODIUM CHLORIDE 0.9 % IV SOLN: 500.0000 mL | Freq: Once | INTRAVENOUS | Status: DC

## 2017-10-25 NOTE — Progress Notes (Signed)
Pt's states no medical or surgical changes since previsit or office visit. 

## 2017-10-25 NOTE — Progress Notes (Signed)
To PACU, VSS. Report to [email or web address] 

## 2017-10-25 NOTE — Op Note (Signed)
[facility] Patient Name: Gail Carlson Procedure Date: 10/25/2017 11:12 AM MRN: [id number] Endoscopist: Ladene Artist , MD Age: 67 Referring MD:  Date of Birth: 1951-01-19 Gender: Female Account #: [id number] Procedure:                Colonoscopy Indications:              High risk colon cancer surveillance: Personal                            history of colon cancer Medicines:                Monitored Anesthesia Care Procedure:                Pre-Anesthesia Assessment:                           - Prior to the procedure, a History and Physical                            was performed, and patient medications and                            allergies were reviewed. The patient's tolerance of                            previous anesthesia was also reviewed. The risks                            and benefits of the procedure and the sedation                            options and risks were discussed with the patient.                            All questions were answered, and informed consent                            was obtained. Prior Anticoagulants: The patient has                            taken no previous anticoagulant or antiplatelet                            agents. ASA Grade Assessment: II - A patient with                            mild systemic disease. After reviewing the risks                            and benefits, the patient was deemed in                            satisfactory condition to undergo the procedure.  After obtaining informed consent, the colonoscope                            was passed under direct vision. Throughout the                            procedure, the patient's blood pressure, pulse, and                            oxygen saturations were monitored continuously. The                            Model PCF-[id number] scope was introduced                            through the anus and advanced to the  the                            ileocolonic anastomosis. The rectum was                            photographed. The quality of the bowel preparation                            was good. The colonoscopy was performed without                            difficulty. The patient tolerated the procedure                            well. Scope In: 11:24:48 AM Scope Out: 11:36:13 AM Scope Withdrawal Time: 0 hours 9 minutes 27 seconds  Total Procedure Duration: 0 hours 11 minutes 25 seconds  Findings:                 The perianal and digital rectal examinations were                            normal.                           There was evidence of a prior end-to-side                            ileo-colonic anastomosis in the ascending colon.                            This was patent and was characterized by a 1 cm                            deep, firm ulceration on the colonic side, distal                            aspect of the anastomosis. The anastomosis was not  traversed. Biopsies were taken with a cold forceps                            for histology.                           Internal hemorrhoids were found during                            retroflexion. The hemorrhoids were medium-sized and                            Grade I (internal hemorrhoids that do not prolapse).                           The exam was otherwise without abnormality on                            direct and retroflexion views. Complications:            No immediate complications. Estimated blood loss:                            None. Estimated Blood Loss:     Estimated blood loss: none. Impression:               - Patent end-to-side ileo-colonic anastomosis,                            characterized by ulceration. Biopsied.                           - Internal hemorrhoids.                           - The examination was otherwise normal on direct                            and retroflexion  views. Recommendation:           - Repeat colonoscopy with an extended bowel prep                            date to be determined after pending pathology                            results are reviewed for surveillance.                           - Patient has a contact number available for                            emergencies. The signs and symptoms of potential                            delayed complications were discussed with the  patient. Return to normal activities tomorrow.                            Written discharge instructions were provided to the                            patient.                           - Resume previous diet.                           - Continue present medications.                           - Await pathology results. Ladene Artist, MD 10/25/2017 11:42:15 AM This report has been signed electronically.

## 2017-10-25 NOTE — Progress Notes (Signed)
Called to room to assist during endoscopic procedure.  Patient ID and intended procedure confirmed with present staff. Received instructions for my participation in the procedure from the performing physician.  

## 2017-10-25 NOTE — Patient Instructions (Signed)
Continue present medications. Please read handout on Hemorrhoids.     YOU HAD AN ENDOSCOPIC PROCEDURE TODAY AT [facility]:   Refer to the procedure report that was given to you for any specific questions about what was found during the examination.  If the procedure report does not answer your questions, please call your gastroenterologist to clarify.  If you requested that your care partner not be given the details of your procedure findings, then the procedure report has been included in a sealed envelope for you to review at your convenience later.  YOU SHOULD EXPECT: Some feelings of bloating in the abdomen. Passage of more gas than usual.  Walking can help get rid of the air that was put into your GI tract during the procedure and reduce the bloating. If you had a lower endoscopy (such as a colonoscopy or flexible sigmoidoscopy) you may notice spotting of blood in your stool or on the toilet paper. If you underwent a bowel prep for your procedure, you may not have a normal bowel movement for a few days.  Please Note:  You might notice some irritation and congestion in your nose or some drainage.  This is from the oxygen used during your procedure.  There is no need for concern and it should clear up in a day or so.  SYMPTOMS TO REPORT IMMEDIATELY:   Following lower endoscopy (colonoscopy or flexible sigmoidoscopy):  Excessive amounts of blood in the stool  Significant tenderness or worsening of abdominal pains  Swelling of the abdomen that is new, acute  Fever of 100F or higher    For urgent or emergent issues, a gastroenterologist can be reached at any hour by calling [phone].   DIET:  We do recommend a small meal at first, but then you may proceed to your regular diet.  Drink plenty of fluids but you should avoid alcoholic beverages for 24 hours.  ACTIVITY:  You should plan to take it easy for the rest of today and you should NOT DRIVE or use heavy  machinery until tomorrow (because of the sedation medicines used during the test).    FOLLOW UP: Our staff will call the number listed on your records the next business day following your procedure to check on you and address any questions or concerns that you may have regarding the information given to you following your procedure. If we do not reach you, we will leave a message.  However, if you are feeling well and you are not experiencing any problems, there is no need to return our call.  We will assume that you have returned to your regular daily activities without incident.  If any biopsies were taken you will be contacted by phone or by letter within the next 1-3 weeks.  Please call us at [phone] if you have not heard about the biopsies in 3 weeks.    SIGNATURES/CONFIDENTIALITY: You and/or your care partner have signed paperwork which will be entered into your electronic medical record.  These signatures attest to the fact that that the information above on your After Visit Summary has been reviewed and is understood.  Full responsibility of the confidentiality of this discharge information lies with you and/or your care-partner.

## 2017-10-26 ENCOUNTER — Telehealth: Payer: Self-pay

## 2017-10-26 NOTE — Telephone Encounter (Signed)
  Follow up Call-  Call back number 10/25/2017 09/15/2015  Post procedure Call Back phone  # [phone]  Permission to leave phone message Yes Yes  Some recent data might be hidden     Patient questions:  Do you have a fever, pain , or abdominal swelling? No. Pain Score  0 *  Have you tolerated food without any problems? Yes.    Have you been able to return to your normal activities? Yes.    Do you have any questions about your discharge instructions: Diet   No. Medications  No. Follow up visit  No.  Do you have questions or concerns about your Care? No.  Actions: * If pain score is 4 or above: No action needed, pain <4.

## 2017-10-26 NOTE — Telephone Encounter (Signed)
  Follow up Call-  Call back number 10/25/2017 09/15/2015  Post procedure Call Back phone  # [phone]  Permission to leave phone message Yes Yes  Some recent data might be hidden     No ID on answering machine Gail Carlson/Call-back

## 2017-10-30 ENCOUNTER — Other Ambulatory Visit: Payer: Self-pay

## 2017-10-30 DIAGNOSIS — C189 Malignant neoplasm of colon, unspecified: Secondary | ICD-10-CM

## 2017-11-01 ENCOUNTER — Inpatient Hospital Stay: Admission: RE | Admit: 2017-11-01 | Payer: Medicare HMO | Source: Ambulatory Visit

## 2017-11-15 ENCOUNTER — Other Ambulatory Visit (INDEPENDENT_AMBULATORY_CARE_PROVIDER_SITE_OTHER): Payer: Medicare HMO

## 2017-11-15 DIAGNOSIS — C189 Malignant neoplasm of colon, unspecified: Secondary | ICD-10-CM

## 2017-11-15 LAB — COMPREHENSIVE METABOLIC PANEL
ALT: 8 U/L (ref 0–35)
AST: 12 U/L (ref 0–37)
Albumin: 4.1 g/dL (ref 3.5–5.2)
Alkaline Phosphatase: 79 U/L (ref 39–117)
BILIRUBIN TOTAL: 0.5 mg/dL (ref 0.2–1.2)
BUN: 19 mg/dL (ref 6–23)
CHLORIDE: 106 meq/L (ref 96–112)
CO2: 28 mEq/L (ref 19–32)
CREATININE: 1.27 mg/dL — AB (ref 0.40–1.20)
Calcium: 9.2 mg/dL (ref 8.4–10.5)
GFR: 53.65 mL/min — ABNORMAL LOW (ref >60.00)
Glucose, Bld: 142 mg/dL — ABNORMAL HIGH (ref 70–99)
Potassium: 3.8 mEq/L (ref 3.5–5.1)
Sodium: 140 mEq/L (ref 135–145)
Total Protein: 7.1 g/dL (ref 6.0–8.3)

## 2017-11-15 LAB — CBC WITH DIFFERENTIAL/PLATELET
BASOS ABS: 0 10*3/uL (ref 0.0–0.1)
BASOS PCT: 0.8 % (ref 0.0–3.0)
EOS ABS: 0.2 10*3/uL (ref 0.0–0.7)
Eosinophils Relative: 4 % (ref 0.0–5.0)
HEMATOCRIT: 41.1 % (ref 36.0–46.0)
Hemoglobin: 13.7 g/dL (ref 12.0–15.0)
LYMPHS PCT: 21 % (ref 12.0–46.0)
Lymphs Abs: 1 10*3/uL (ref 0.7–4.0)
MCHC: 33.4 g/dL (ref 30.0–36.0)
MCV: 89.2 fl (ref 78.0–100.0)
Monocytes Absolute: 0.3 10*3/uL (ref 0.1–1.0)
Monocytes Relative: 6.9 % (ref 3.0–12.0)
Neutro Abs: 3.3 10*3/uL (ref 1.4–7.7)
Neutrophils Relative %: 67.3 % (ref 43.0–77.0)
Platelets: 266 10*3/uL (ref 150.0–400.0)
RBC: 4.61 Mil/uL (ref 3.87–5.11)
RDW: 14.6 % (ref 11.5–15.5)
WBC: 5 10*3/uL (ref 4.0–10.5)

## 2017-11-16 ENCOUNTER — Ambulatory Visit (INDEPENDENT_AMBULATORY_CARE_PROVIDER_SITE_OTHER)
Admission: RE | Admit: 2017-11-16 | Discharge: 2017-11-16 | Disposition: A | Discharge status: Home / Self Care | Payer: Medicare HMO | Source: Ambulatory Visit | Attending: Gastroenterology | Admitting: Gastroenterology

## 2017-11-16 DIAGNOSIS — C189 Malignant neoplasm of colon, unspecified: Secondary | ICD-10-CM

## 2017-11-16 MED ORDER — IOPAMIDOL (ISOVUE-300) INJECTION 61%
100.0000 mL | Freq: Once | INTRAVENOUS | Status: AC | PRN
  Administered 2017-11-16: 100 mL via INTRAVENOUS

## 2017-11-17 LAB — CEA: CEA: 2.2 ng/mL

## 2017-12-13 ENCOUNTER — Ambulatory Visit: Payer: Self-pay | Admitting: Surgery

## 2018-01-04 NOTE — Progress Notes (Signed)
CT CHEST 01-12-17 Epic

## 2018-01-04 NOTE — Patient Instructions (Addendum)
Gail Carlson  01/04/2018   Your procedure is scheduled on: 01-11-18   Report to [facility] Main  Entrance    Report to admitting at 12:00PM    Call this number if you have problems the morning of surgery [phone]     Remember: [place].  DRINK 2 PRESURGERY ENSURE DRINKS THE NIGHT BEFORE SURGERY AT 10:00 PM. NO SOLIDS AFTER MIDNIGHT THE DAY PRIOR TO THE SURGERY.  PLEASE FINISH LAST PRESURGERY ENSURE DRINK PER SURGEON ORDER 3 HOURS PRIOR TO SCHEDULED SURGERY TIME WHICH NEEDS TO BE COMPLETED AT ___11:00AM______.       CLEAR LIQUID DIET   Foods Allowed                                                                     Foods Excluded  Coffee and tea, regular and decaf                             liquids that you cannot  Plain Jell-O in any flavor                                             see through such as: Fruit ices (not with fruit pulp)                                     milk, soups, orange juice  Iced Popsicles                                    All solid food Carbonated beverages, regular and diet                                    Cranberry, grape and apple juices Sports drinks like Gatorade Lightly seasoned clear broth or consume(fat free) Sugar, honey syrup  Sample Menu Breakfast                                Lunch                                     Supper Cranberry juice                    Beef broth                            Chicken broth Jell-O  Grape juice                           Apple juice Coffee or tea                        Jell-O                                      Popsicle                                                Coffee or tea                        Coffee or tea  _____________________________________________________________________       Take these medicines the morning of surgery with A SIP OF WATER: AMLODIPINE,  CITALOPRAM                                You may not have any metal on your body including hair pins and              piercings  Do not wear jewelry, make-up, lotions, powders or perfumes, deodorant             Do not wear nail polish.  Do not shave  48 hours prior to surgery.               Do not bring valuables to the hospital. Gail [facility]son.  Contacts, dentures or bridgework may not be worn into surgery.  Leave suitcase in the car. After surgery it may be brought to your room.                 Please read over the following fact sheets you were given: _____________________________________________________________________             Gail Carlson - Preparing for Surgery Before surgery, you can play an important role.  Because skin is not sterile, your skin needs to be as free of germs as possible.  You can reduce the number of germs on your skin by washing with CHG (chlorahexidine gluconate) soap before surgery.  CHG is an antiseptic cleaner which kills germs and bonds with the skin to continue killing germs even after washing. Please DO NOT use if you have an allergy to CHG or antibacterial soaps.  If your skin becomes reddened/irritated stop using the CHG and inform your nurse when you arrive at Short Stay. Do not shave (including legs and underarms) for at least 48 hours prior to the first CHG shower.  You may shave your face/neck. Please follow these instructions carefully:  1.  Shower with CHG Soap the night before surgery and the  morning of Surgery.  2.  If you choose to wash your hair, wash your hair first as usual with your  normal  shampoo.  3.  After you shampoo, rinse your hair and body thoroughly to remove the  shampoo.  4.  Use CHG as you would any other liquid soap.  You can apply chg directly  to the skin and wash                       Gently with a scrungie or clean washcloth.  5.  Apply the CHG Soap  to your body ONLY FROM THE NECK DOWN.   Do not use on face/ open                           Wound or open sores. Avoid contact with eyes, ears mouth and genitals (private parts).                       Wash face,  Genitals (private parts) with your normal soap.             6.  Wash thoroughly, paying special attention to the area where your surgery  will be performed.  7.  Thoroughly rinse your body with warm water from the neck down.  8.  DO NOT shower/wash with your normal soap after using and rinsing off  the CHG Soap.                9.  Pat yourself dry with a clean towel.            10.  Wear clean pajamas.            11.  Place clean sheets on your bed the night of your first shower and do not  sleep with pets. Day of Surgery : Do not apply any lotions/deodorants the morning of surgery.  Please wear clean clothes to the hospital/surgery [facility].  FAILURE TO FOLLOW THESE INSTRUCTIONS MAY RESULT IN THE CANCELLATION OF YOUR SURGERY PATIENT SIGNATURE_________________________________  NURSE SIGNATURE__________________________________  ________________________________________________________________________   Gail Carlson  An incentive spirometer is a tool that can help keep your lungs clear and active. This tool measures how well you are filling your lungs with each breath. Taking long deep breaths may help reverse or decrease the chance of developing breathing (pulmonary) problems (especially infection) following:  A long period of time when you are unable to move or be active. BEFORE THE PROCEDURE   If the spirometer includes an indicator to show your best effort, your nurse or respiratory therapist will set it to a desired goal.  If possible, sit up straight or lean slightly forward. Try not to slouch.  Hold the incentive spirometer in an upright position. INSTRUCTIONS FOR USE  1. Sit on the edge of your bed if possible, or sit up as far as you can in bed or on a  chair. 2. Hold the incentive spirometer in an upright position. 3. Breathe out normally. 4. Place the mouthpiece in your mouth and seal your lips tightly around it. 5. Breathe in slowly and as deeply as possible, raising the piston or the ball toward the top of the column. 6. Hold your breath for 3-5 seconds or for as long as possible. Allow the piston or ball to fall to the bottom of the column. 7. Remove the mouthpiece from your mouth and breathe out normally. 8. Rest for a few seconds and repeat Steps 1 through 7 at least 10 times every 1-2 hours when you are awake. Take your time and take a few normal breaths between deep breaths. 9. The spirometer may include an indicator to  show your best effort. Use the indicator as a goal to work toward during each repetition. 10. After each set of 10 deep breaths, practice coughing to be sure your lungs are clear. If you have an incision (the cut made at the time of surgery), support your incision when coughing by placing a pillow or rolled up towels firmly against it. Once you are able to get out of bed, walk around indoors and cough well. You may stop using the incentive spirometer when instructed by your caregiver.  RISKS AND COMPLICATIONS  Take your time so you do not get dizzy or light-headed.  If you are in pain, you may need to take or ask for pain medication before doing incentive spirometry. It is harder to take a deep breath if you are having pain. AFTER USE  Rest and breathe slowly and easily.  It can be helpful to keep track of a log of your progress. Your caregiver can provide you with a simple table to help with this. If you are using the spirometer at home, follow these instructions: [place] IF:   You are having difficultly using the spirometer.  You have trouble using the spirometer as often as instructed.  Your pain medication is not giving enough relief while using the spirometer.  You develop fever of 100.5 F  (38.1 C) or higher. SEEK IMMEDIATE MEDICAL CARE IF:   You cough up bloody sputum that had not been present before.  You develop fever of 102 F (38.9 C) or greater.  You develop worsening pain at or near the incision site. MAKE SURE YOU:   Understand these instructions.  Will watch your condition.  Will get help right away if you are not doing well or get worse. Document Released: 05/30/2006 Document Revised: 04/11/2011 Document Reviewed: 07/31/2006 [facility] Patient Information 2014 [place], [place].   ________________________________________________________________________

## 2018-01-05 ENCOUNTER — Encounter (HOSPITAL_COMMUNITY): Payer: Self-pay

## 2018-01-05 ENCOUNTER — Other Ambulatory Visit: Payer: Self-pay

## 2018-01-05 ENCOUNTER — Encounter (HOSPITAL_COMMUNITY)
Admission: RE | Admit: 2018-01-05 | Discharge: 2018-01-05 | Disposition: A | Discharge status: Home / Self Care | Payer: Medicare HMO | Source: Ambulatory Visit | Attending: Surgery | Admitting: Surgery

## 2018-01-05 DIAGNOSIS — Z01818 Encounter for other preprocedural examination: Secondary | ICD-10-CM | POA: Insufficient documentation

## 2018-01-05 HISTORY — DX: Polyneuropathy, unspecified: G62.9

## 2018-01-05 LAB — CBC
HCT: 40.9 % (ref 36.0–46.0)
Hemoglobin: 12.8 g/dL (ref 12.0–15.0)
MCH: 30 pg (ref 26.0–34.0)
MCHC: 31.3 g/dL (ref 30.0–36.0)
MCV: 96 fL (ref 80.0–100.0)
NRBC: 0 % (ref 0.0–0.2)
Platelets: 228 10*3/uL (ref 150–400)
RBC: 4.26 MIL/uL (ref 3.87–5.11)
RDW: 13.8 % (ref 11.5–15.5)
WBC: 5.5 10*3/uL (ref 4.0–10.5)

## 2018-01-05 LAB — BASIC METABOLIC PANEL
Anion gap: 6 (ref 5–15)
BUN: 24 mg/dL — ABNORMAL HIGH (ref 8–23)
CO2: 29 mmol/L (ref 22–32)
CREATININE: 1.3 mg/dL — AB (ref 0.44–1.00)
Calcium: 9.2 mg/dL (ref 8.9–10.3)
Chloride: 108 mmol/L (ref 98–111)
GFR calc Af Amer: 48 mL/min — ABNORMAL LOW (ref >60)
GFR calc non Af Amer: 42 mL/min — ABNORMAL LOW (ref >60)
Glucose, Bld: 94 mg/dL (ref 70–99)
Potassium: 4.3 mmol/L (ref 3.5–5.1)
Sodium: 143 mmol/L (ref 135–145)

## 2018-01-05 LAB — HEMOGLOBIN A1C
Hgb A1c MFr Bld: 5.2 % (ref 4.8–5.6)
Mean Plasma Glucose: 102.54 mg/dL

## 2018-01-07 ENCOUNTER — Encounter (HOSPITAL_COMMUNITY): Payer: Self-pay | Admitting: Surgery

## 2018-01-07 NOTE — H&P (Signed)
General Surgery [facility]  Gail Carlson DOB: 30-Jul-1950 Widowed / Language: English / Race: Black or African American Female   History of Present Illness  The patient is a 67 year old female who presents with colorectal cancer.  CHIEF COMPLAINT: adenocarcinoma of the colon  Patient returns to my practice for evaluation for colon resection for adenocarcinoma of the colon. Patient is well known to my practice from a previous colonic resection performed in December 2015. She had an adenocarcinoma of the cecum. She underwent an open right colectomy. Postoperatively she was treated with adjuvant chemotherapy by Dr. [name]. Patient has been followed by Dr. Lucio Edward with colonoscopy. On her most recent examination, in September 2019, she was found to have an area of ulceration adjacent to her ileocolonic anastomosis. Biopsies demonstrated adenocarcinoma. Patient underwent a subsequent CT scan of the abdomen and pelvis which did not identify any additional disease. Patient does have a known incisional hernia in the upper portion of her midline incision which we have considered repair. Patient now presents to discuss resection of the ileocolonic anastomosis with associated tumor and concurrent repair of her incisional hernia.   Problem List/Past Medical PRIMARY ADENOCARCINOMA OF COLON (C18.9)  INCISIONAL HERNIA, WITHOUT OBSTRUCTION OR GANGRENE (K43.2)  HISTORY OF COLON CANCER (Z85.038)   Past Surgical History Colon Polyp Removal - Colonoscopy   Diagnostic Studies History Colonoscopy  within last year Mammogram  within last year Pap Smear  1-5 years ago  Allergies No Known Drug Allergies [01/22/2014]: Allergies Reconciled   Medication History Prochlorperazine Maleate (10MG  Tablet, Oral) Active. AmLODIPine Besylate (10MG  Tablet, Oral) Active. Ondansetron (8MG  Tablet Disint, Oral as needed) Active. Citalopram Hydrobromide (10MG   Tablet, Oral) Active. RA Aspirin EC Adult Low St (81MG  Tablet DR, Oral) Active. Meloxicam (15MG  Tablet, Oral) Active. OxyCODONE HCl (5MG  Tablet, Oral as needed) Active. Lisinopril-Hydrochlorothiazide (20-12.5MG  Tablet, Oral) Active. Medications Reconciled  Social History Alcohol use  Occasional alcohol use. Caffeine use  Coffee. Tobacco use  Former smoker.  Family History Arthritis  Mother. Breast Cancer  Mother. Depression  Brother. Diabetes Mellitus  Brother, Mother. Heart Disease  Mother. Hypertension  Mother.  Pregnancy / Birth History  Age at menarche  62 years. Age of menopause  <43 Gravida  2 Maternal age  65-20 Para  2  Other Problems Anxiety Disorder  Arthritis  Back Pain  Colon Cancer  Depression  Gastric Ulcer  Hemorrhoids  High blood pressure  Unspecified Diagnosis   Vitals Weight: 183.13 lb Height: 63in Body Surface Area: 1.86 m Body Mass Index: 32.44 kg/m  Temp.: 98.21F  Pulse: 83 (Regular)  BP: 140/80 (Sitting, Left Arm, Standard  Physical Exam  See vital signs recorded above  GENERAL APPEARANCE Development: normal Nutritional status: normal Gross deformities: none  SKIN Rash, lesions, ulcers: none Induration, erythema: none Nodules: none palpable  EYES Conjunctiva and lids: normal Pupils: equal and reactive Iris: normal bilaterally  EARS, NOSE, MOUTH, THROAT External ears: no lesion or deformity External nose: no lesion or deformity Hearing: grossly normal Lips: no lesion or deformity Dentition: normal for age Oral mucosa: moist  NECK Symmetric: yes Trachea: midline Thyroid: no palpable nodules in the thyroid bed  CHEST Respiratory effort: normal Retraction or accessory muscle use: no Breath sounds: normal bilaterally Rales, rhonchi, wheeze: none  CARDIOVASCULAR Auscultation: regular rhythm, normal rate Murmurs: none Pulses: carotid and radial pulse 2+ palpable Lower  extremity edema: none Lower extremity varicosities: none  ABDOMEN Distension: none Masses: none palpable Tenderness: none Hepatosplenomegaly: not  present Hernia: Incisional hernia, upper midline and just to the left of midline, spontaneously reducible, augments with Valsalva. Well-healed midline surgical incision.  MUSCULOSKELETAL Station and gait: normal Digits and nails: no clubbing or cyanosis Muscle strength: grossly normal all extremities Range of motion: grossly normal all extremities Deformity: none  LYMPHATIC Cervical: none palpable Supraclavicular: none palpable  PSYCHIATRIC Oriented to person, place, and time: yes Mood and affect: normal for situation Judgment and insight: appropriate for situation    Assessment & Plan  PRIMARY ADENOCARCINOMA OF COLON (C18.9) INCISIONAL HERNIA, WITHOUT OBSTRUCTION OR GANGRENE (K43.2)  Patient returns to my practice having undergone colonoscopy demonstrating adenocarcinoma at the ileocolonic anastomosis. This likely represents a new primary tumor rather than an anastomotic recurrence since her initial procedure was nearly 4 years ago.  We discussed the need for resection of the ileocolonic anastomosis with its associated mesentery. We also discussed concurrent repair of her ventral incisional hernia. This will need to be done primarily as we will not want to place mesh at the time of colonic resection. We may need to raise skin flaps and do relaxing incisions which would require placement of subcutaneous drains postoperatively. Patient will also be required to wear an abdominal binder for several weeks.  We discussed using the midline surgical incision. We discussed the hospital stay to be anticipated. Based on the pathology results, we will ask the patient to see Dr. [name] again in consultation regarding potential need for adjuvant chemotherapy.  Patient understands and wishes to proceed with surgery following the  Thanksgiving holiday.  The risks and benefits of the procedure have been discussed at length with the patient. The patient understands the proposed procedure, potential alternative treatments, and the course of recovery to be expected. All of the patient's questions have been answered at this time. The patient wishes to proceed with surgery.  Armandina Gemma, [facility] Office: [phone]

## 2018-01-10 NOTE — Progress Notes (Signed)
Spoke with patient . informed her surgery time is 12:00pm. Needs to check in at 10:00am. No solids after midnight , clear liquids till 0900, drink last ensure drink at 0900. Patient verbalized understanding

## 2018-01-11 ENCOUNTER — Encounter (HOSPITAL_COMMUNITY)
Admission: RE | Disposition: A | Discharge status: Home / Self Care | Payer: Self-pay | Source: Ambulatory Visit | Attending: Surgery

## 2018-01-11 ENCOUNTER — Inpatient Hospital Stay (HOSPITAL_COMMUNITY)
Admission: RE | Admit: 2018-01-11 | Discharge: 2018-01-15 | DRG: 331 | Disposition: A | Discharge status: Home / Self Care | Payer: Medicare HMO | Source: Ambulatory Visit | Attending: Surgery | Admitting: Surgery

## 2018-01-11 ENCOUNTER — Encounter (HOSPITAL_COMMUNITY): Payer: Self-pay

## 2018-01-11 ENCOUNTER — Inpatient Hospital Stay (HOSPITAL_COMMUNITY): Payer: Medicare HMO | Admitting: Anesthesiology

## 2018-01-11 DIAGNOSIS — C180 Malignant neoplasm of cecum: Secondary | ICD-10-CM | POA: Diagnosis present

## 2018-01-11 DIAGNOSIS — C189 Malignant neoplasm of colon, unspecified: Secondary | ICD-10-CM | POA: Diagnosis present

## 2018-01-11 DIAGNOSIS — F329 Major depressive disorder, single episode, unspecified: Secondary | ICD-10-CM | POA: Diagnosis present

## 2018-01-11 DIAGNOSIS — Z9221 Personal history of antineoplastic chemotherapy: Secondary | ICD-10-CM

## 2018-01-11 DIAGNOSIS — Z7982 Long term (current) use of aspirin: Secondary | ICD-10-CM | POA: Diagnosis not present

## 2018-01-11 DIAGNOSIS — K660 Peritoneal adhesions (postprocedural) (postinfection): Secondary | ICD-10-CM | POA: Diagnosis present

## 2018-01-11 DIAGNOSIS — Z87891 Personal history of nicotine dependence: Secondary | ICD-10-CM | POA: Diagnosis not present

## 2018-01-11 DIAGNOSIS — Z79899 Other long term (current) drug therapy: Secondary | ICD-10-CM | POA: Diagnosis not present

## 2018-01-11 DIAGNOSIS — K432 Incisional hernia without obstruction or gangrene: Secondary | ICD-10-CM | POA: Diagnosis present

## 2018-01-11 DIAGNOSIS — Z791 Long term (current) use of non-steroidal anti-inflammatories (NSAID): Secondary | ICD-10-CM | POA: Diagnosis not present

## 2018-01-11 HISTORY — PX: COLON RESECTION: SHX5231

## 2018-01-11 HISTORY — PX: INCISIONAL HERNIA REPAIR: SHX193

## 2018-01-11 LAB — CBC
HCT: 40.2 % (ref 36.0–46.0)
HEMOGLOBIN: 12.6 g/dL (ref 12.0–15.0)
MCH: 29.9 pg (ref 26.0–34.0)
MCHC: 31.3 g/dL (ref 30.0–36.0)
MCV: 95.3 fL (ref 80.0–100.0)
Platelets: 223 10*3/uL (ref 150–400)
RBC: 4.22 MIL/uL (ref 3.87–5.11)
RDW: 13.5 % (ref 11.5–15.5)
WBC: 18.1 10*3/uL — AB (ref 4.0–10.5)
nRBC: 0 % (ref 0.0–0.2)

## 2018-01-11 LAB — CREATININE, SERUM
Creatinine, Ser: 1.32 mg/dL — ABNORMAL HIGH (ref 0.44–1.00)
GFR calc non Af Amer: 41 mL/min — ABNORMAL LOW (ref >60)
GFR, EST AFRICAN AMERICAN: 48 mL/min — AB (ref >60)

## 2018-01-11 LAB — TYPE AND SCREEN
ABO/RH(D): O POS
Antibody Screen: NEGATIVE

## 2018-01-11 SURGERY — COLON RESECTION
Anesthesia: General | Site: Abdomen

## 2018-01-11 MED ORDER — TRAMADOL HCL 50 MG PO TABS: 50.0000 mg | ORAL_TABLET | Freq: Four times a day (QID) | ORAL | Status: DC | PRN

## 2018-01-11 MED ORDER — ACETAMINOPHEN 325 MG PO TABS: 650.0000 mg | ORAL_TABLET | Freq: Four times a day (QID) | ORAL | Status: DC | PRN

## 2018-01-11 MED ORDER — LIDOCAINE 2% (20 MG/ML) 5 ML SYRINGE
INTRAMUSCULAR | Status: DC | PRN
  Administered 2018-01-11: 1.5 mg/kg/h via INTRAVENOUS

## 2018-01-11 MED ORDER — ONDANSETRON HCL 4 MG/2ML IJ SOLN
INTRAMUSCULAR | Status: AC
  Filled 2018-01-11: qty 2

## 2018-01-11 MED ORDER — ALVIMOPAN 12 MG PO CAPS
ORAL_CAPSULE | ORAL | Status: AC
  Filled 2018-01-11: qty 1

## 2018-01-11 MED ORDER — FENTANYL CITRATE (PF) 100 MCG/2ML IJ SOLN
INTRAMUSCULAR | Status: DC | PRN
  Administered 2018-01-11: 100 ug via INTRAVENOUS
  Administered 2018-01-11: 10 ug via INTRAVENOUS

## 2018-01-11 MED ORDER — ONDANSETRON HCL 4 MG/2ML IJ SOLN: 4.0000 mg | Freq: Once | INTRAMUSCULAR | Status: DC | PRN

## 2018-01-11 MED ORDER — HYDROMORPHONE HCL 1 MG/ML IJ SOLN
INTRAMUSCULAR | Status: AC
  Administered 2018-01-11: 0.5 mg via INTRAVENOUS
  Filled 2018-01-11: qty 1

## 2018-01-11 MED ORDER — FENTANYL CITRATE (PF) 100 MCG/2ML IJ SOLN
INTRAMUSCULAR | Status: AC
  Administered 2018-01-11: 50 ug via INTRAVENOUS
  Filled 2018-01-11: qty 2

## 2018-01-11 MED ORDER — CHLORHEXIDINE GLUCONATE CLOTH 2 % EX PADS: 6.0000 | MEDICATED_PAD | Freq: Once | CUTANEOUS | Status: DC

## 2018-01-11 MED ORDER — LACTATED RINGERS IV SOLN
INTRAVENOUS | Status: DC
  Administered 2018-01-11: 1000 mL via INTRAVENOUS
  Administered 2018-01-11: 14:00:00 via INTRAVENOUS

## 2018-01-11 MED ORDER — PROPOFOL 10 MG/ML IV BOLUS
INTRAVENOUS | Status: AC
  Filled 2018-01-11: qty 20

## 2018-01-11 MED ORDER — LISINOPRIL 20 MG PO TABS
20.0000 mg | ORAL_TABLET | ORAL | Status: DC
  Administered 2018-01-12 – 2018-01-14 (×2): 20 mg via ORAL
  Filled 2018-01-11 (×2): qty 1

## 2018-01-11 MED ORDER — CITALOPRAM HYDROBROMIDE 10 MG PO TABS
10.0000 mg | ORAL_TABLET | Freq: Every day | ORAL | Status: DC
  Administered 2018-01-12 – 2018-01-15 (×4): 10 mg via ORAL
  Filled 2018-01-11 (×5): qty 1

## 2018-01-11 MED ORDER — DEXAMETHASONE SODIUM PHOSPHATE 10 MG/ML IJ SOLN
INTRAMUSCULAR | Status: DC | PRN
  Administered 2018-01-11: 10 mg via INTRAVENOUS

## 2018-01-11 MED ORDER — EPHEDRINE 5 MG/ML INJ
INTRAVENOUS | Status: AC
  Filled 2018-01-11: qty 10

## 2018-01-11 MED ORDER — FENTANYL CITRATE (PF) 100 MCG/2ML IJ SOLN
25.0000 ug | INTRAMUSCULAR | Status: DC | PRN
  Administered 2018-01-11 (×3): 50 ug via INTRAVENOUS

## 2018-01-11 MED ORDER — ROCURONIUM BROMIDE 10 MG/ML (PF) SYRINGE
PREFILLED_SYRINGE | INTRAVENOUS | Status: DC | PRN
  Administered 2018-01-11: 50 mg via INTRAVENOUS
  Administered 2018-01-11: 20 mg via INTRAVENOUS
  Administered 2018-01-11: 10 mg via INTRAVENOUS

## 2018-01-11 MED ORDER — HYDROMORPHONE HCL 1 MG/ML IJ SOLN
1.0000 mg | INTRAMUSCULAR | Status: DC | PRN
  Administered 2018-01-11 – 2018-01-12 (×5): 1 mg via INTRAVENOUS
  Filled 2018-01-11 (×6): qty 1

## 2018-01-11 MED ORDER — SUGAMMADEX SODIUM 200 MG/2ML IV SOLN
INTRAVENOUS | Status: AC
  Filled 2018-01-11: qty 2

## 2018-01-11 MED ORDER — PROPOFOL 10 MG/ML IV BOLUS
INTRAVENOUS | Status: DC | PRN
  Administered 2018-01-11: 60 mg via INTRAVENOUS

## 2018-01-11 MED ORDER — ALVIMOPAN 12 MG PO CAPS
12.0000 mg | ORAL_CAPSULE | ORAL | Status: AC
  Administered 2018-01-11: 12 mg via ORAL

## 2018-01-11 MED ORDER — 0.9 % SODIUM CHLORIDE (POUR BTL) OPTIME
TOPICAL | Status: DC | PRN
  Administered 2018-01-11: 3000 mL

## 2018-01-11 MED ORDER — OXYCODONE HCL 5 MG PO TABS
ORAL_TABLET | ORAL | Status: AC
  Filled 2018-01-11: qty 1

## 2018-01-11 MED ORDER — OXYCODONE HCL 5 MG PO TABS
5.0000 mg | ORAL_TABLET | ORAL | Status: DC | PRN
  Administered 2018-01-11 (×2): 5 mg via ORAL
  Administered 2018-01-12 – 2018-01-15 (×7): 10 mg via ORAL
  Filled 2018-01-11 (×9): qty 2

## 2018-01-11 MED ORDER — ONDANSETRON HCL 4 MG/2ML IJ SOLN
INTRAMUSCULAR | Status: DC | PRN
  Administered 2018-01-11: 4 mg via INTRAVENOUS

## 2018-01-11 MED ORDER — ENSURE SURGERY PO LIQD
237.0000 mL | Freq: Two times a day (BID) | ORAL | Status: DC
  Administered 2018-01-12 – 2018-01-14 (×6): 237 mL via ORAL
  Filled 2018-01-11 (×8): qty 237

## 2018-01-11 MED ORDER — LIDOCAINE 2% (20 MG/ML) 5 ML SYRINGE
INTRAMUSCULAR | Status: AC
  Filled 2018-01-11: qty 5

## 2018-01-11 MED ORDER — ONDANSETRON HCL 4 MG PO TABS
4.0000 mg | ORAL_TABLET | Freq: Four times a day (QID) | ORAL | Status: DC | PRN
  Filled 2018-01-11: qty 1

## 2018-01-11 MED ORDER — ENOXAPARIN SODIUM 40 MG/0.4ML ~~LOC~~ SOLN
40.0000 mg | SUBCUTANEOUS | Status: DC
  Administered 2018-01-12 – 2018-01-15 (×4): 40 mg via SUBCUTANEOUS
  Filled 2018-01-11 (×4): qty 0.4

## 2018-01-11 MED ORDER — AMLODIPINE BESYLATE 10 MG PO TABS
10.0000 mg | ORAL_TABLET | Freq: Every day | ORAL | Status: DC
  Administered 2018-01-13 – 2018-01-15 (×3): 10 mg via ORAL
  Filled 2018-01-11 (×4): qty 1

## 2018-01-11 MED ORDER — SUGAMMADEX SODIUM 200 MG/2ML IV SOLN
INTRAVENOUS | Status: DC | PRN
  Administered 2018-01-11: 200 mg via INTRAVENOUS

## 2018-01-11 MED ORDER — HYDROCHLOROTHIAZIDE 12.5 MG PO CAPS
12.5000 mg | ORAL_CAPSULE | ORAL | Status: DC
  Administered 2018-01-12 – 2018-01-14 (×2): 12.5 mg via ORAL
  Filled 2018-01-11 (×2): qty 1

## 2018-01-11 MED ORDER — ROCURONIUM BROMIDE 10 MG/ML (PF) SYRINGE
PREFILLED_SYRINGE | INTRAVENOUS | Status: AC
  Filled 2018-01-11: qty 10

## 2018-01-11 MED ORDER — ONDANSETRON HCL 4 MG/2ML IJ SOLN
4.0000 mg | Freq: Four times a day (QID) | INTRAMUSCULAR | Status: DC | PRN
  Administered 2018-01-11 – 2018-01-12 (×2): 4 mg via INTRAVENOUS
  Filled 2018-01-11 (×2): qty 2

## 2018-01-11 MED ORDER — LISINOPRIL-HYDROCHLOROTHIAZIDE 20-12.5 MG PO TABS: 1.0000 | ORAL_TABLET | ORAL | Status: DC

## 2018-01-11 MED ORDER — DEXAMETHASONE SODIUM PHOSPHATE 10 MG/ML IJ SOLN
INTRAMUSCULAR | Status: AC
  Filled 2018-01-11: qty 1

## 2018-01-11 MED ORDER — OXYCODONE HCL 5 MG PO TABS
ORAL_TABLET | ORAL | Status: AC
  Administered 2018-01-11: 5 mg via ORAL
  Filled 2018-01-11: qty 1

## 2018-01-11 MED ORDER — KCL IN DEXTROSE-NACL 20-5-0.45 MEQ/L-%-% IV SOLN
INTRAVENOUS | Status: DC
  Administered 2018-01-12 – 2018-01-14 (×4): via INTRAVENOUS
  Filled 2018-01-11 (×5): qty 1000

## 2018-01-11 MED ORDER — SODIUM CHLORIDE 0.9 % IV SOLN
2.0000 g | INTRAVENOUS | Status: AC
  Administered 2018-01-11: 2 g via INTRAVENOUS

## 2018-01-11 MED ORDER — LIDOCAINE 2% (20 MG/ML) 5 ML SYRINGE
INTRAMUSCULAR | Status: DC | PRN
  Administered 2018-01-11: 100 mg via INTRAVENOUS

## 2018-01-11 MED ORDER — HYDROMORPHONE HCL 1 MG/ML IJ SOLN
0.2500 mg | INTRAMUSCULAR | Status: DC | PRN
  Administered 2018-01-11 (×4): 0.5 mg via INTRAVENOUS

## 2018-01-11 MED ORDER — FENTANYL CITRATE (PF) 100 MCG/2ML IJ SOLN
INTRAMUSCULAR | Status: AC
  Filled 2018-01-11: qty 2

## 2018-01-11 MED ORDER — SODIUM CHLORIDE 0.9 % IV SOLN
INTRAVENOUS | Status: AC
  Filled 2018-01-11: qty 2

## 2018-01-11 SURGICAL SUPPLY — 50 items
APL SWBSTK 6 STRL LF DISP (MISCELLANEOUS) ×1
APPLICATOR COTTON TIP 6 STRL (MISCELLANEOUS) ×2 IMPLANT
APPLICATOR COTTON TIP 6IN STRL (MISCELLANEOUS) ×3
BINDER ABDOMINAL 12 ML 46-62 (SOFTGOODS) IMPLANT
BLADE EXTENDED COATED 6.5IN (ELECTRODE) ×2 IMPLANT
BLADE HEX COATED 2.75 (ELECTRODE) ×3 IMPLANT
CHLORAPREP W/TINT 26ML (MISCELLANEOUS) ×3 IMPLANT
CLIP VESOCCLUDE LG 6/CT (CLIP) IMPLANT
COVER SURGICAL LIGHT HANDLE (MISCELLANEOUS) ×5 IMPLANT
COVER WAND RF STERILE (DRAPES) ×2 IMPLANT
DRAPE LAPAROSCOPIC ABDOMINAL (DRAPES) ×1 IMPLANT
DRSG OPSITE POSTOP 4X12 (GAUZE/BANDAGES/DRESSINGS) ×2 IMPLANT
ELECT REM PT RETURN 15FT ADLT (MISCELLANEOUS) ×3 IMPLANT
EVACUATOR DRAINAGE 10X20 100CC (DRAIN) IMPLANT
EVACUATOR SILICONE 100CC (DRAIN)
GAUZE SPONGE 4X4 12PLY STRL (GAUZE/BANDAGES/DRESSINGS) ×1 IMPLANT
GLOVE BIOGEL PI IND STRL 7.0 (GLOVE) ×1 IMPLANT
GLOVE BIOGEL PI INDICATOR 7.0 (GLOVE) ×4
GLOVE SURG ORTHO 8.0 STRL STRW (GLOVE) ×6 IMPLANT
GOWN STRL REUS W/TWL LRG LVL3 (GOWN DISPOSABLE) ×3 IMPLANT
GOWN STRL REUS W/TWL XL LVL3 (GOWN DISPOSABLE) ×14 IMPLANT
LIGASURE IMPACT 36 18CM CVD LR (INSTRUMENTS) ×2 IMPLANT
NS IRRIG 1000ML POUR BTL (IV SOLUTION) ×6 IMPLANT
PACK COLON (CUSTOM PROCEDURE TRAY) ×5 IMPLANT
RELOAD PROXIMATE 75MM BLUE (ENDOMECHANICALS) ×6 IMPLANT
RELOAD STAPLE 75 3.8 BLU REG (ENDOMECHANICALS) IMPLANT
SHEARS HARMONIC ACE PLUS 36CM (ENDOMECHANICALS) IMPLANT
STAPLER GUN LINEAR PROX 60 (STAPLE) ×2 IMPLANT
STAPLER PROXIMATE 75MM BLUE (STAPLE) ×2 IMPLANT
STAPLER VISISTAT 35W (STAPLE) ×3 IMPLANT
SUT ETHILON 3 0 PS 1 (SUTURE) IMPLANT
SUT NOV 1 T60/GS (SUTURE) IMPLANT
SUT NOVA 1 T20/GS 25DT (SUTURE) ×6 IMPLANT
SUT NOVA NAB DX-16 0-1 5-0 T12 (SUTURE) IMPLANT
SUT NOVA NAB GS-21 0 18 T12 DT (SUTURE) IMPLANT
SUT NOVA T20/GS 25 (SUTURE) IMPLANT
SUT PDS AB 1 CTX 36 (SUTURE) ×2 IMPLANT
SUT SILK 2 0 (SUTURE) ×3
SUT SILK 2 0 SH CR/8 (SUTURE) ×3 IMPLANT
SUT SILK 2 0SH CR/8 30 (SUTURE) ×2 IMPLANT
SUT SILK 2-0 18XBRD TIE 12 (SUTURE) ×1 IMPLANT
SUT SILK 2-0 30XBRD TIE 12 (SUTURE) IMPLANT
SUT SILK 3 0 (SUTURE) ×3
SUT SILK 3 0 SH CR/8 (SUTURE) ×3 IMPLANT
SUT SILK 3-0 18XBRD TIE 12 (SUTURE) ×2 IMPLANT
SUT VIC AB 2-0 CT2 27 (SUTURE) ×1 IMPLANT
SUT VIC AB 4-0 SH 18 (SUTURE) IMPLANT
TOWEL OR 17X26 10 PK STRL BLUE (TOWEL DISPOSABLE) ×1 IMPLANT
TRAY FOLEY CATH 14FRSI W/METER (CATHETERS) ×2 IMPLANT
YANKAUER SUCT BULB TIP NO VENT (SUCTIONS) ×3 IMPLANT

## 2018-01-11 NOTE — Interval H&P Note (Signed)
History and Physical Interval Note:  01/11/2018 11:42 AM  Gail Carlson  has presented today for surgery, with the diagnosis of ADENOCARCINOMA OF COLON, INCISIONAL HERNIA.  The various methods of treatment have been discussed with the patient and family. After consideration of risks, benefits and other options for treatment, the patient has consented to    Procedure(s): OPEN COLON RESECTION (N/A) HERNIA REPAIR INCISIONAL (N/A) as a surgical intervention .    The patient's history has been reviewed, patient examined, no change in status, stable for surgery.  I have reviewed the patient's chart and labs.  Questions were answered to the patient's satisfaction.    Armandina Gemma, [facility] Office: [place]

## 2018-01-11 NOTE — Transfer of Care (Signed)
Immediate Anesthesia Transfer of Care Note  Patient: GRACIEANN STANNARD  Procedure(s) Performed: OPEN COLON RESECTION (N/A Abdomen) HERNIA REPAIR INCISIONAL (N/A Abdomen)  Patient Location: PACU  Anesthesia Type:General  Level of Consciousness: sedated  Airway & Oxygen Therapy: Patient Spontanous Breathing and Patient connected to face mask oxygen  Post-op Assessment: Report given to RN and Post -op Vital signs reviewed and stable  Post vital signs: Reviewed and stable  Last Vitals:  Vitals Value Taken Time  BP    Temp    Pulse 73 01/11/2018  2:13 PM  Resp    SpO2 100 % 01/11/2018  2:13 PM  Vitals shown include unvalidated device data.  Last Pain:  Vitals:   01/11/18 1058  TempSrc:   PainSc: 0-No pain      Patients Stated Pain Goal: 4 (64/40/34 7425)  Complications: No apparent anesthesia complications

## 2018-01-11 NOTE — Anesthesia Preprocedure Evaluation (Addendum)
Anesthesia Evaluation  Patient identified by MRN, date of birth, ID band Patient awake    Reviewed: Allergy & Precautions, NPO status , Patient's Chart, lab work & pertinent test results  Airway Mallampati: III  TM Distance: >3 FB Neck ROM: Full    Dental  (+) Dental Advisory Given   Pulmonary Current Smoker,    breath sounds clear to auscultation       Cardiovascular hypertension, Pt. on medications  Rhythm:Regular Rate:Normal     Neuro/Psych Anxiety Depression negative neurological ROS     GI/Hepatic Neg liver ROS, GERD  ,Colon CA   Endo/Other  negative endocrine ROS  Renal/GU negative Renal ROS     Musculoskeletal  (+) Arthritis ,   Abdominal   Peds  Hematology negative hematology ROS (+)   Anesthesia Other Findings   Reproductive/Obstetrics                             Lab Results  Component Value Date   WBC 5.5 01/05/2018   HGB 12.8 01/05/2018   HCT 40.9 01/05/2018   MCV 96.0 01/05/2018   PLT 228 01/05/2018   Lab Results  Component Value Date   CREATININE 1.30 (H) 01/05/2018   BUN 24 (H) 01/05/2018   NA 143 01/05/2018   K 4.3 01/05/2018   CL 108 01/05/2018   CO2 29 01/05/2018    Anesthesia Physical Anesthesia Plan  ASA: III  Anesthesia Plan: General   Post-op Pain Management:    Induction: Intravenous  PONV Risk Score and Plan: 2 and Ondansetron, Dexamethasone, Treatment may vary due to age or medical condition and Scopolamine patch - Pre-op  Airway Management Planned: Oral ETT  Additional Equipment:   Intra-op Plan:   Post-operative Plan: Extubation in OR  Informed Consent: I have reviewed the patients History and Physical, chart, labs and discussed the procedure including the risks, benefits and alternatives for the proposed anesthesia with the patient or authorized representative who has indicated his/her understanding and acceptance.   Dental advisory  given  Plan Discussed with: CRNA  Anesthesia Plan Comments:        Anesthesia Quick Evaluation

## 2018-01-11 NOTE — Anesthesia Procedure Notes (Signed)
Procedure Name: Intubation Date/Time: 01/11/2018 12:00 PM Performed by: Lind Covert, CRNA Pre-anesthesia Checklist: Patient identified, Emergency Drugs available, Suction available, Patient being monitored and Timeout performed Patient Re-evaluated:Patient Re-evaluated prior to induction Oxygen Delivery Method: Circle system utilized Preoxygenation: Pre-oxygenation with 100% oxygen Induction Type: IV induction Ventilation: Mask ventilation without difficulty Laryngoscope Size: Mac and 4 Grade View: Grade I Tube type: Oral Tube size: 7.0 mm Number of attempts: 1 Airway Equipment and Method: Stylet Placement Confirmation: ETT inserted through vocal cords under direct vision,  positive ETCO2 and breath sounds checked- equal and bilateral Secured at: 22 cm Tube secured with: Tape Dental Injury: Teeth and Oropharynx as per pre-operative assessment

## 2018-01-11 NOTE — Anesthesia Postprocedure Evaluation (Signed)
Anesthesia Post Note  Patient: Gail Carlson  Procedure(s) Performed: OPEN COLON RESECTION (N/A Abdomen) HERNIA REPAIR INCISIONAL (N/A Abdomen)     Patient location during evaluation: PACU Anesthesia Type: General Level of consciousness: awake and alert Pain management: pain level controlled Vital Signs Assessment: post-procedure vital signs reviewed and stable Respiratory status: spontaneous breathing, nonlabored ventilation, respiratory function stable and patient connected to nasal cannula oxygen Cardiovascular status: blood pressure returned to baseline and stable Postop Assessment: no apparent nausea or vomiting Anesthetic complications: no    Last Vitals:  Vitals:   01/11/18 1430 01/11/18 1445  BP: 139/86 136/66  Pulse: 73 73  Resp: 16 15  Temp:    SpO2: 100% 98%    Last Pain:  Vitals:   01/11/18 1445  TempSrc:   PainSc: 6                  Anzal Bartnick DAVID

## 2018-01-11 NOTE — Op Note (Signed)
Operative Note  Pre-operative Diagnosis:  Adenocarcinoma of the colon, incisional hernia  Post-operative Diagnosis:  same  Surgeon:  Armandina Gemma, MD  Assistant:  none   Procedure:  1. Ileocolonic resection of anastomosis with tumor  2. Primary repair of incisional hernia  Anesthesia:  general  Estimated Blood Loss:  100cc  Drains: none         Specimen: ileocolonic resection to pathology  Indications:  Patient returns to my practice for evaluation for colon resection for adenocarcinoma of the colon. Patient is well known to my practice from a previous colonic resection performed in December 2015. She had an adenocarcinoma of the cecum. She underwent an open right colectomy. Postoperatively she was treated with adjuvant chemotherapy by Dr. [name]. Patient has been followed by Dr. Lucio Edward with colonoscopy. On her most recent examination, in September 2019, she was found to have an area of ulceration adjacent to her ileocolonic anastomosis. Biopsies demonstrated adenocarcinoma. Patient underwent a subsequent CT scan of the abdomen and pelvis which did not identify any additional disease. Patient does have a known incisional hernia in the upper portion of her midline incision which we have considered repair. Patient now presents to discuss resection of the ileocolonic anastomosis with associated tumor and concurrent repair of her incisional hernia.  Procedure Details:  The patient was seen in the pre-op holding area. The risks, benefits, complications, treatment options, and expected outcomes were previously discussed with the patient. The patient agreed with the proposed plan and has signed the informed consent form.  The patient was brought to the operating room by the surgical team, identified as Marcelle Overlie and the procedure verified. A "time out" was completed and the above information confirmed.  Following administration of general endotracheal anesthesia, the  patient was positioned and then prepped and draped in the usual aseptic fashion.  After ascertaining that an adequate level of anesthesia been achieved, the patient's previous midline incision is reopened with a #10 blade.  Dissection was carried through scar tissue down to the fascia.  Previously placed suture material is extracted.  Fascia was incised in the midline and the peritoneal cavity is entered cautiously.  Adhesions to the anterior abdominal wall were taken down carefully circumferentially using electrocautery and the Metzenbaum scissors.  There is an incisional hernia in the upper midline.  There are multiple defects.  These are dissected out and the fascia debrided back to healthy fascia along the edge of the rectus musculature.  A Balfour retractor was placed for exposure.  Next lysis of adhesions was performed between loops of small bowel.  The ileocolonic anastomosis is identified in the right lower abdomen.  It is adherent posteriorly.  Using sharp dissection the anastomosis is freed from its peritoneal attachments.  Several loops of small bowel are adherent to the area of the ileocolonic anastomosis.  Adhesions are lysed sharply and lysis of adhesions is completed.  This allows for complete mobilization of the ileocolonic anastomosis up into the wound.  A point approximately 20 cm distal to the anastomosis is selected and what represents the transverse colon.  This is transected with a GIA stapler.  A point in the distal ileum is also selected and transected with a GIA stapler.  Mesentery is divided with the LigaSure.  What appears to be the right colonic artery or possibly a branch of the middle colonic artery is divided between Winnett clamps with the LigaSure and then ligated with 2-0 silk suture ligature.  Specimen is passed  off the field and submitted to pathology for review.  Anastomosis is then performed between the distal ileum and the transverse colon in a side-to-side fashion using  GIA stapler.  Staple line is inspected for hemostasis.  Enterotomy is closed with a TA 60 stapler.  Mesenteric defect was closed with interrupted 2-0 silk sutures.  Abdomen is explored and there is no sign of metastatic disease in the omentum, pelvis, or liver.  Next gowns and gloves are changed, drapes are changed.  Instruments are changed.  Abdomen is then irrigated with a large volume of warm saline.  This is evacuated.  Good hemostasis is noted.  Midline abdominal incision is then prepared for closure.  Skin flaps are developed circumferentially.  In the left upper quadrant the skin flap is rather extensive in order to free up enough of the anterior abdominal wall for primary closure.  Fascial edges are debrided.  Midline incision is then closed with interrupted #1 Novafil simple sutures.  Subcutaneous tissues irrigated.  Skin is closed with stainless steel staples.  A honeycomb dressing is applied.  Patient is awakened from anesthesia and transported to the recovery room.  The patient tolerated the procedure well.   Armandina Gemma, MD [facility] Office: [phone]

## 2018-01-12 ENCOUNTER — Other Ambulatory Visit: Payer: Self-pay

## 2018-01-12 ENCOUNTER — Encounter (HOSPITAL_COMMUNITY): Payer: Self-pay | Admitting: Surgery

## 2018-01-12 LAB — CBC
HCT: 35.8 % — ABNORMAL LOW (ref 36.0–46.0)
Hemoglobin: 11.4 g/dL — ABNORMAL LOW (ref 12.0–15.0)
MCH: 30.5 pg (ref 26.0–34.0)
MCHC: 31.8 g/dL (ref 30.0–36.0)
MCV: 95.7 fL (ref 80.0–100.0)
Platelets: 200 10*3/uL (ref 150–400)
RBC: 3.74 MIL/uL — ABNORMAL LOW (ref 3.87–5.11)
RDW: 13.4 % (ref 11.5–15.5)
WBC: 14 10*3/uL — AB (ref 4.0–10.5)
nRBC: 0 % (ref 0.0–0.2)

## 2018-01-12 LAB — BASIC METABOLIC PANEL
Anion gap: 8 (ref 5–15)
BUN: 24 mg/dL — ABNORMAL HIGH (ref 8–23)
CO2: 25 mmol/L (ref 22–32)
CREATININE: 1.34 mg/dL — AB (ref 0.44–1.00)
Calcium: 8.1 mg/dL — ABNORMAL LOW (ref 8.9–10.3)
Chloride: 105 mmol/L (ref 98–111)
GFR calc Af Amer: 47 mL/min — ABNORMAL LOW (ref >60)
GFR calc non Af Amer: 40 mL/min — ABNORMAL LOW (ref >60)
Glucose, Bld: 146 mg/dL — ABNORMAL HIGH (ref 70–99)
Potassium: 4.2 mmol/L (ref 3.5–5.1)
Sodium: 138 mmol/L (ref 135–145)

## 2018-01-12 NOTE — Plan of Care (Signed)
Patient in bed this morning. Complains of 4/10 pain and requesting pain medication. Foley d/c this am; will continue to monitor.

## 2018-01-12 NOTE — Progress Notes (Signed)
Assessment & Plan: POD#1 - status post ileocolectomy for adenocarcinoma  Tolerating clear liquids on ERAS protocol  Encouraged OOB, IS use today  IV hydration        Armandina Gemma, MD       [facility]       Office: [phone]   Chief Complaint: Colon cancer  Subjective: Patient in bed, feels great.  Been up to bathroom.  Tolerating clear liquids.  Wants food.  Objective: Vital signs in last 24 hours: Temp:  [97.5 F (36.4 C)-99.5 F (37.5 C)] 98.2 F (36.8 C) (12/13 0533) Pulse Rate:  [65-83] 65 (12/13 0533) Resp:  [12-20] 16 (12/13 0533) BP: (100-162)/(66-94) 156/75 (12/13 0533) SpO2:  [95 %-100 %] 96 % (12/13 0533) Weight:  [83.5 kg-85.6 kg] 85.6 kg (12/13 0500) Last BM Date: 01/10/18  Intake/Output from previous day: 12/12 0701 - 12/13 0700 In: 2480 [P.O.:480; I.V.:2000] Out: 645 [Urine:625; Blood:20] Intake/Output this shift: No intake/output data recorded.  Physical Exam: HEENT - sclerae clear, mucous membranes moist Neck - soft Chest - clear bilaterally Cor - RRR Abdomen - soft without distension; midline incision dry and intact; BS present Ext - no edema, non-tender Neuro - alert & oriented, no focal deficits  Lab Results:  Recent Labs    01/11/18 2042 01/12/18 0503  WBC 18.1* 14.0*  HGB 12.6 11.4*  HCT 40.2 35.8*  PLT 223 200   BMET Recent Labs    01/11/18 2042 01/12/18 0503  NA  --  138  K  --  4.2  CL  --  105  CO2  --  25  GLUCOSE  --  146*  BUN  --  24*  CREATININE 1.32* 1.34*  CALCIUM  --  8.1*   PT/INR No results for input(s): LABPROT, INR in the last 72 hours. Comprehensive Metabolic Panel:    Component Value Date/Time   NA 138 01/12/2018 0503   NA 143 01/05/2018 1022   NA 140 01/12/2017 0947   NA 141 01/14/2016 1118   K 4.2 01/12/2018 0503   K 4.3 01/05/2018 1022   K 4.0 01/12/2017 0947   K 4.0 01/14/2016 1118   CL 105 01/12/2018 0503   CL 108 01/05/2018 1022   CO2 25 01/12/2018 0503   CO2 29 01/05/2018 1022   CO2 28 01/12/2017 0947   CO2 26 01/14/2016 1118   BUN 24 (H) 01/12/2018 0503   BUN 24 (H) 01/05/2018 1022   BUN 11.4 01/12/2017 0947   BUN 16.8 01/14/2016 1118   CREATININE 1.34 (H) 01/12/2018 0503   CREATININE 1.32 (H) 01/11/2018 2042   CREATININE 1.3 (H) 01/12/2017 0947   CREATININE 1.2 (H) 01/14/2016 1118   GLUCOSE 146 (H) 01/12/2018 0503   GLUCOSE 94 01/05/2018 1022   GLUCOSE 88 01/12/2017 0947   GLUCOSE 92 01/14/2016 1118   CALCIUM 8.1 (L) 01/12/2018 0503   CALCIUM 9.2 01/05/2018 1022   CALCIUM 9.3 01/12/2017 0947   CALCIUM 9.3 01/14/2016 1118   AST 12 11/15/2017 1032   AST 16 01/05/2015 0815   AST 27 07/31/2014 0838   ALT 8 11/15/2017 1032   ALT 9 01/05/2015 0815   ALT 22 07/31/2014 0838   ALKPHOS 79 11/15/2017 1032   ALKPHOS 108 01/05/2015 0815   ALKPHOS 140 07/31/2014 0838   BILITOT 0.5 11/15/2017 1032   BILITOT <0.30 01/05/2015 0815   BILITOT 0.37 07/31/2014 0838   PROT 7.1 11/15/2017 1032   PROT 7.3 01/05/2015 0815   PROT  6.5 07/31/2014 0838   ALBUMIN 4.1 11/15/2017 1032   ALBUMIN 4.0 01/05/2015 0815   ALBUMIN 3.5 07/31/2014 0838    Studies/Results: No results found.    Lennell Shanks M 01/12/2018  Patient ID: Marcelle Overlie, female   DOB: 01-16-1951, 67 y.o.   MRN: [id number]

## 2018-01-13 NOTE — Progress Notes (Signed)
Assessment & Plan: POD#2 - status post ileocolectomy for adenocarcinoma             Beginning full liquid diet             Encouraged OOB, ambulation in halls             IV hydration        Armandina Gemma, MD       [facility]       Office: [phone]   Chief Complaint: Colon cancer  Subjective: Patient up in lounge chair.  Has ambulated.  Passing flatus, no BM's.  Pain controlled.  Taking full liquids this AM.  Objective: Vital signs in last 24 hours: Temp:  [98.2 F (36.8 C)-99.2 F (37.3 C)] 98.3 F (36.8 C) (12/14 0553) Pulse Rate:  [62-67] 62 (12/14 0553) Resp:  [14-18] 15 (12/14 0553) BP: (115-154)/(62-78) 115/62 (12/14 0553) SpO2:  [92 %-97 %] 93 % (12/14 0553) Weight:  [80.5 kg] 80.5 kg (12/14 0553) Last BM Date: 01/10/18  Intake/Output from previous day: 12/13 0701 - 12/14 0700 In: 1728.7 [P.O.:480; I.V.:1248.7] Out: 500 [Urine:500] Intake/Output this shift: No intake/output data recorded.  Physical Exam: HEENT - sclerae clear, mucous membranes moist Neck - soft Chest - clear bilaterally Cor - RRR Abdomen - soft, quiet; midline wound dry and intact Ext - no edema, non-tender Neuro - alert & oriented, no focal deficits  Lab Results:  Recent Labs    01/11/18 2042 01/12/18 0503  WBC 18.1* 14.0*  HGB 12.6 11.4*  HCT 40.2 35.8*  PLT 223 200   BMET Recent Labs    01/11/18 2042 01/12/18 0503  NA  --  138  K  --  4.2  CL  --  105  CO2  --  25  GLUCOSE  --  146*  BUN  --  24*  CREATININE 1.32* 1.34*  CALCIUM  --  8.1*   PT/INR No results for input(s): LABPROT, INR in the last 72 hours. Comprehensive Metabolic Panel:    Component Value Date/Time   NA 138 01/12/2018 0503   NA 143 01/05/2018 1022   NA 140 01/12/2017 0947   NA 141 01/14/2016 1118   K 4.2 01/12/2018 0503   K 4.3 01/05/2018 1022   K 4.0 01/12/2017 0947   K 4.0 01/14/2016 1118   CL 105 01/12/2018 0503   CL 108 01/05/2018 1022   CO2 25 01/12/2018  0503   CO2 29 01/05/2018 1022   CO2 28 01/12/2017 0947   CO2 26 01/14/2016 1118   BUN 24 (H) 01/12/2018 0503   BUN 24 (H) 01/05/2018 1022   BUN 11.4 01/12/2017 0947   BUN 16.8 01/14/2016 1118   CREATININE 1.34 (H) 01/12/2018 0503   CREATININE 1.32 (H) 01/11/2018 2042   CREATININE 1.3 (H) 01/12/2017 0947   CREATININE 1.2 (H) 01/14/2016 1118   GLUCOSE 146 (H) 01/12/2018 0503   GLUCOSE 94 01/05/2018 1022   GLUCOSE 88 01/12/2017 0947   GLUCOSE 92 01/14/2016 1118   CALCIUM 8.1 (L) 01/12/2018 0503   CALCIUM 9.2 01/05/2018 1022   CALCIUM 9.3 01/12/2017 0947   CALCIUM 9.3 01/14/2016 1118   AST 12 11/15/2017 1032   AST 16 01/05/2015 0815   AST 27 07/31/2014 0838   ALT 8 11/15/2017 1032   ALT 9 01/05/2015 0815   ALT 22 07/31/2014 0838   ALKPHOS 79 11/15/2017 1032   ALKPHOS 108 01/05/2015 0815   ALKPHOS 140 07/31/2014 3762  BILITOT 0.5 11/15/2017 1032   BILITOT <0.30 01/05/2015 0815   BILITOT 0.37 07/31/2014 0838   PROT 7.1 11/15/2017 1032   PROT 7.3 01/05/2015 0815   PROT 6.5 07/31/2014 0838   ALBUMIN 4.1 11/15/2017 1032   ALBUMIN 4.0 01/05/2015 0815   ALBUMIN 3.5 07/31/2014 0838    Studies/Results: No results found.    Elecia Serafin M 01/13/2018  Patient ID: Gail Carlson, female   DOB: Jun 08, 1950, 67 y.o.   MRN: [id number]

## 2018-01-13 NOTE — Plan of Care (Signed)
Patient in bed this morning. States had a good night; pain okay currently. Wants to get up to bathroom and get washed up, gown change. Will continue to monitor.

## 2018-01-14 MED ORDER — OXYCODONE HCL 5 MG PO TABS: 5.0000 mg | ORAL_TABLET | Freq: Four times a day (QID) | ORAL | 0 refills | Status: DC | PRN

## 2018-01-14 MED ORDER — DIPHENHYDRAMINE HCL 25 MG PO CAPS
25.0000 mg | ORAL_CAPSULE | Freq: Once | ORAL | Status: AC
  Administered 2018-01-14: 25 mg via ORAL
  Filled 2018-01-14: qty 1

## 2018-01-14 MED ORDER — DIPHENHYDRAMINE HCL 25 MG PO CAPS
ORAL_CAPSULE | ORAL | Status: AC
  Filled 2018-01-14: qty 1

## 2018-01-14 MED ORDER — DIPHENHYDRAMINE HCL 25 MG PO CAPS
25.0000 mg | ORAL_CAPSULE | Freq: Three times a day (TID) | ORAL | Status: DC | PRN
  Administered 2018-01-14 – 2018-01-15 (×3): 25 mg via ORAL
  Filled 2018-01-14 (×2): qty 1

## 2018-01-14 NOTE — Plan of Care (Signed)
Patient in bed this morning. No complaints or concerns voiced at this time. Will continue to monitor.

## 2018-01-14 NOTE — Progress Notes (Signed)
Assessment & Plan: POD#3 - status post ileocolectomy for adenocarcinoma Tolerating full liquid diet - advance to regular diet Encouraged OOB, ambulation in halls  Discontinue IV  Await pathology - will review with Dr. [name]  Anticipate discharge home today or tomorrow AM        Armandina Gemma, MD       [facility]       Office: [phone]   Chief Complaint: Colon cancer  Subjective: Patient up in room, no complaints.  Tolerating full liquids, wants to advance.  BM's x 2.  Objective: Vital signs in last 24 hours: Temp:  [98.3 F (36.8 C)-98.9 F (37.2 C)] 98.6 F (37 C) (12/15 0531) Pulse Rate:  [68-72] 70 (12/15 0531) Resp:  [16-17] 16 (12/15 0531) BP: (132-154)/(66-82) 154/80 (12/15 0531) SpO2:  [96 %] 96 % (12/15 0531) Weight:  [80.8 kg] 80.8 kg (12/15 0531) Last BM Date: 01/13/18  Intake/Output from previous day: 12/14 0701 - 12/15 0700 In: 1557.7 [P.O.:660; I.V.:897.7] Out: -  Intake/Output this shift: No intake/output data recorded.  Physical Exam: HEENT - sclerae clear, mucous membranes moist Neck - soft Chest - clear bilaterally Cor - RRR Abdomen - soft without distension; non-tender; midline wound dry and intact Ext - no edema, non-tender Neuro - alert & oriented, no focal deficits  Lab Results:  Recent Labs    01/11/18 2042 01/12/18 0503  WBC 18.1* 14.0*  HGB 12.6 11.4*  HCT 40.2 35.8*  PLT 223 200   BMET Recent Labs    01/11/18 2042 01/12/18 0503  NA  --  138  K  --  4.2  CL  --  105  CO2  --  25  GLUCOSE  --  146*  BUN  --  24*  CREATININE 1.32* 1.34*  CALCIUM  --  8.1*   PT/INR No results for input(s): LABPROT, INR in the last 72 hours. Comprehensive Metabolic Panel:    Component Value Date/Time   NA 138 01/12/2018 0503   NA 143 01/05/2018 1022   NA 140 01/12/2017 0947   NA 141 01/14/2016 1118   K 4.2 01/12/2018 0503   K 4.3 01/05/2018 1022   K 4.0 01/12/2017 0947     K 4.0 01/14/2016 1118   CL 105 01/12/2018 0503   CL 108 01/05/2018 1022   CO2 25 01/12/2018 0503   CO2 29 01/05/2018 1022   CO2 28 01/12/2017 0947   CO2 26 01/14/2016 1118   BUN 24 (H) 01/12/2018 0503   BUN 24 (H) 01/05/2018 1022   BUN 11.4 01/12/2017 0947   BUN 16.8 01/14/2016 1118   CREATININE 1.34 (H) 01/12/2018 0503   CREATININE 1.32 (H) 01/11/2018 2042   CREATININE 1.3 (H) 01/12/2017 0947   CREATININE 1.2 (H) 01/14/2016 1118   GLUCOSE 146 (H) 01/12/2018 0503   GLUCOSE 94 01/05/2018 1022   GLUCOSE 88 01/12/2017 0947   GLUCOSE 92 01/14/2016 1118   CALCIUM 8.1 (L) 01/12/2018 0503   CALCIUM 9.2 01/05/2018 1022   CALCIUM 9.3 01/12/2017 0947   CALCIUM 9.3 01/14/2016 1118   AST 12 11/15/2017 1032   AST 16 01/05/2015 0815   AST 27 07/31/2014 0838   ALT 8 11/15/2017 1032   ALT 9 01/05/2015 0815   ALT 22 07/31/2014 0838   ALKPHOS 79 11/15/2017 1032   ALKPHOS 108 01/05/2015 0815   ALKPHOS 140 07/31/2014 0838   BILITOT 0.5 11/15/2017 1032   BILITOT <0.30 01/05/2015 0815   BILITOT 0.37  07/31/2014 0838   PROT 7.1 11/15/2017 1032   PROT 7.3 01/05/2015 0815   PROT 6.5 07/31/2014 0838   ALBUMIN 4.1 11/15/2017 1032   ALBUMIN 4.0 01/05/2015 0815   ALBUMIN 3.5 07/31/2014 0838    Studies/Results: No results found.    Tirrell Buchberger M 01/14/2018  Patient ID: Marcelle Overlie, female   DOB: 08-20-1950, 67 y.o.   MRN: [id number]

## 2018-01-15 NOTE — Discharge Instructions (Signed)
[facility] ° °OPEN ABDOMINAL SURGERY: POST OP INSTRUCTIONS ° °Always review your discharge instruction sheet given to you by the facility where your surgery was performed. ° °1. A prescription for pain medication may be given to you upon discharge.  Take your pain medication as prescribed.  If narcotic pain medicine is not needed, then you may take acetaminophen (Tylenol) or ibuprofen (Advil) as needed. °2. Take your usually prescribed medications unless otherwise directed. °3. If you need a refill on your pain medication, please contact your pharmacy. They will contact our office to request authorization.  Prescriptions will not be filled after 5 pm or on weekends. °4. You should follow a light diet the first few days after arrival home, such as soup and crackers, unless your doctor has advised otherwise. A high-fiber, low fat diet can be resumed as tolerated.  Be sure to include plenty of fluids daily.  °5. Most patients will experience some swelling and bruising in the area of the incision. Ice packs will help. Swelling and bruising can take several days to resolve. °6. It is common to experience some constipation if taking pain medication after surgery.  Increasing fluid intake and taking a stool softener will usually help or prevent this problem from occurring.  A mild laxative (Milk of Magnesia or Miralax) should be taken according to package directions if there are no bowel movements after 48 hours. °7.  You may have steri-strips (small skin tapes) in place directly over the incision.  These strips should be left on the skin for 5-7 days.  Any sutures or staples will be removed at the office during your follow-up visit. You may find that a light gauze bandage over your incision may keep your staples from being rubbed or pulled. You may shower and replace the bandage daily. °8. ACTIVITIES:  You may resume regular (light) daily activities beginning the next day - such as daily self-care,  walking, climbing stairs - gradually increasing activities as tolerated.  You may have sexual intercourse when it is comfortable.  Refrain from any heavy lifting or straining until approved by your doctor.  You may drive when you no longer are taking prescription pain medication, you can comfortably wear a seatbelt, and you can safely maneuver your car and apply brakes. °9. You should see your doctor in the office for a follow-up appointment approximately 2-3 weeks after your surgery.  Make sure that you call for this appointment within a day or two after you arrive home to insure a convenient appointment time. ° °WHEN TO CALL YOUR DOCTOR: °1. Fever greater than 101.0 °2. Inability to urinate °3. Persistent nausea and/or vomiting °4. Extreme swelling or bruising °5. Continued bleeding from incision °6. Increased pain, redness, or drainage from the incision °7. Difficulty swallowing or breathing °8. Muscle cramping or spasms °9. Numbness or tingling in hands or around lips ° °IF YOU HAVE DISABILITY OR FAMILY LEAVE FORMS, YOU MUST BRING THEM TO THE OFFICE FOR PROCESSING.  PLEASE DO NOT GIVE THEM TO YOUR DOCTOR. ° °The clinic staff is available to answer your questions during regular business hours.  Please don’t hesitate to call and ask to speak to one of the nurses if you have concerns. ° °[facility] °Office: [phone] ° °For further questions, please visit [email or web address] ° ° °

## 2018-01-15 NOTE — Discharge Summary (Signed)
Physician Discharge Summary [facility]  Patient ID: Gail Carlson MRN: [id number] DOB/AGE: 1950/10/13 67 y.o.  Admit date: 01/11/2018 Discharge date: 01/15/2018  Admission Diagnoses:  Adenocarcinoma of colon  Discharge Diagnoses:  Principal Problem:   Colon adenocarcinoma [facility]) Active Problems:   Adenocarcinoma of colon [facility])   Discharged Condition: good  Hospital Course: Patient was admitted for observation following colon surgery.  Post op course was uncomplicated.  Pain was well controlled.  Tolerated diet as advanced.  Patient was prepared for discharge home on POD#4.  Consults: None  Treatments: surgery: ileocecectomy  Discharge Exam: Blood pressure (!) 173/86, pulse 67, temperature 98.7 F (37.1 C), temperature source Oral, resp. rate 18, height 5\' 3"  (1.6 m), weight 77.3 kg, SpO2 95 %. See progress notes.   Disposition: Home  Discharge Instructions    Diet - low sodium heart healthy   Complete by:  As directed    Discharge instructions   Complete by:  As directed    [facility]  OPEN ABDOMINAL SURGERY: POST OP INSTRUCTIONS  Always review your discharge instruction sheet given to you by the facility where your surgery was performed.  A prescription for pain medication may be given to you upon discharge.  Take your pain medication as prescribed.  If narcotic pain medicine is not needed, then you may take acetaminophen (Tylenol) or ibuprofen (Advil) as needed. Take your usually prescribed medications unless otherwise directed. If you need a refill on your pain medication, please contact your pharmacy. They will contact our office to request authorization.  Prescriptions will not be filled after 5 pm or on weekends. You should follow a light diet the first few days after arrival home, such as soup and crackers, unless your doctor has advised otherwise. A high-fiber, low fat diet can be resumed as tolerated.  Be sure to  include plenty of fluids daily.  Most patients will experience some swelling and bruising in the area of the incision. Ice packs will help. Swelling and bruising can take several days to resolve. It is common to experience some constipation if taking pain medication after surgery.  Increasing fluid intake and taking a stool softener will usually help or prevent this problem from occurring.  A mild laxative (Milk of Magnesia or Miralax) should be taken according to package directions if there are no bowel movements after 48 hours.  You may have steri-strips (small skin tapes) in place directly over the incision.  These strips should be left on the skin for 5-7 days.  Any sutures or staples will be removed at the office during your follow-up visit. You may find that a light gauze bandage over your incision may keep your staples from being rubbed or pulled. You may shower and replace the bandage daily. ACTIVITIES:  You may resume regular (light) daily activities beginning the next day - such as daily self-care, walking, climbing stairs - gradually increasing activities as tolerated.  You may have sexual intercourse when it is comfortable.  Refrain from any heavy lifting or straining until approved by your doctor.  You may drive when you no longer are taking prescription pain medication, you can comfortably wear a seatbelt, and you can safely maneuver your car and apply brakes. You should see your doctor in the office for a follow-up appointment approximately 2-3 weeks after your surgery.  Make sure that you call for this appointment within a day or two after you arrive home to insure a convenient  appointment time.  WHEN TO CALL YOUR DOCTOR: Fever greater than 101.0 Inability to urinate Persistent nausea and/or vomiting Extreme swelling or bruising Continued bleeding from incision Increased pain, redness, or drainage from the incision Difficulty swallowing or breathing Muscle cramping or spasms Numbness  or tingling in hands or around lips  IF YOU HAVE DISABILITY OR FAMILY LEAVE FORMS, YOU MUST BRING THEM TO THE OFFICE FOR PROCESSING.  PLEASE DO NOT GIVE THEM TO YOUR DOCTOR.  The clinic staff is available to answer your questions during regular business hours.  Please don't hesitate to call and ask to speak to one of the nurses if you have concerns.  [facility] [place] Office: [phone]  For further questions, please visit [email or web address]   Increase activity slowly   Complete by:  As directed    No dressing needed   Complete by:  As directed      Allergies as of 01/15/2018   No Known Allergies     Medication List    TAKE these medications   amLODipine 10 MG tablet Commonly known as:  NORVASC Take 1 tablet (10 mg total) by mouth daily.   ARTIFICIAL TEARS OP Place 1 drop into both eyes daily as needed (for dry eyes).   aspirin EC 81 MG tablet Take 81 mg by mouth daily.   citalopram 10 MG tablet Commonly known as:  CELEXA Take 10 mg by mouth daily.   ferrous sulfate 325 (65 FE) MG tablet take 1 tablet by mouth twice a day with meals   lisinopril-hydrochlorothiazide 20-12.5 MG tablet Commonly known as:  PRINZIDE,ZESTORETIC Take 1 tablet by mouth every other day.   meloxicam 15 MG tablet Commonly known as:  MOBIC Take 15 mg by mouth every other day.   MULTIVITAMIN WOMEN PO Take 1 tablet by mouth daily.   oxyCODONE 5 MG immediate release tablet Commonly known as:  Oxy IR/ROXICODONE Take 1-2 tablets (5-10 mg total) by mouth every 6 (six) hours as needed for moderate pain.   VITAMIN B-12 PO Take 1 tablet by mouth 4 (four) times a week.   VITAMIN D3 PO Take 1 capsule by mouth daily.      Follow-up Information    Armandina Gemma, MD. Schedule an appointment as soon as possible for a visit in 3 day(s).   Specialty:  General Surgery Why:  for staple removal Contact information: [address] [place]  [address] [phone]           Earnstine Regal, MD, [facility] Office: [phone]   Signed: Earnstine Regal 01/15/2018, 10:13 AM

## 2018-02-01 ENCOUNTER — Inpatient Hospital Stay: Payer: Medicare HMO | Attending: Nurse Practitioner | Admitting: Nurse Practitioner

## 2018-02-01 ENCOUNTER — Encounter: Payer: Self-pay | Admitting: Nurse Practitioner

## 2018-02-01 ENCOUNTER — Inpatient Hospital Stay: Payer: Medicare HMO

## 2018-02-01 VITALS — BP 159/97 | HR 75 | Temp 98.6°F | Resp 17 | Ht 63.0 in | Wt 183.8 lb

## 2018-02-01 DIAGNOSIS — C180 Malignant neoplasm of cecum: Secondary | ICD-10-CM | POA: Insufficient documentation

## 2018-02-01 DIAGNOSIS — I10 Essential (primary) hypertension: Secondary | ICD-10-CM | POA: Insufficient documentation

## 2018-02-01 DIAGNOSIS — Z9221 Personal history of antineoplastic chemotherapy: Secondary | ICD-10-CM | POA: Diagnosis not present

## 2018-02-01 DIAGNOSIS — D509 Iron deficiency anemia, unspecified: Secondary | ICD-10-CM | POA: Insufficient documentation

## 2018-02-01 DIAGNOSIS — Z803 Family history of malignant neoplasm of breast: Secondary | ICD-10-CM | POA: Diagnosis not present

## 2018-02-01 DIAGNOSIS — C189 Malignant neoplasm of colon, unspecified: Secondary | ICD-10-CM

## 2018-02-01 DIAGNOSIS — Z85038 Personal history of other malignant neoplasm of large intestine: Secondary | ICD-10-CM | POA: Diagnosis not present

## 2018-02-01 LAB — CEA (IN HOUSE-CHCC): CEA (CHCC-In House): 2.07 ng/mL (ref 0.00–5.00)

## 2018-02-01 NOTE — Progress Notes (Addendum)
[place] OFFICE PROGRESS NOTE   Diagnosis: Colon cancer  INTERVAL HISTORY:   Gail Carlson returns as scheduled.  She feels she is recovering well from the recent colon surgery.  No nausea or vomiting.  Bowels moving regularly.  She has occasional abdominal pain.  She reports a good appetite.  No urinary symptoms.  Objective:  Vital signs in last 24 hours:  Blood pressure (!) 159/97, pulse 75, temperature 98.6 F (37 C), temperature source Oral, resp. rate 17, height _0  (1.6 m), weight 183 lb 12.8 oz (83.4 kg), SpO2 100 %.    HEENT: Neck without mass. Lymphatics: No palpable cervical, supraclavicular, axillary or inguinal lymph nodes. Resp: Lungs clear bilaterally. Cardio: Regular rate and rhythm. GI: No hepatomegaly.  Surgical incision overall is well-healed.  2 small scabbed areas at the inferior portion of the incision. Vascular: No leg edema.    Lab Results:  Lab Results  Component Value Date   WBC 14.0 (H) 01/12/2018   HGB 11.4 (L) 01/12/2018   HCT 35.8 (L) 01/12/2018   MCV 95.7 01/12/2018   PLT 200 01/12/2018   NEUTROABS 3.3 11/15/2017    Imaging:  No results found.  Medications: I have reviewed the patient's current medications.  Assessment/Plan: 1. 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 ? CTs of the chest, abdomen, and pelvis on 01/10/2014 with a cecal mass, nonspecific small liver lesions, and nonspecific lung nodules felt to most likely represent mucoid impaction ? Cycle 1 FOLFOX 02/20/2014 ? Cycle 2 FOLFOX 03/06/2014 ? Cycle 3 FOLFOX 03/20/2014 ? Cycle 4 FOLFOX 04/03/2014 ? Cycle 5 FOLFOX held on 04/17/2014 due to neutropenia ? Cycle 5 FOLFOX 04/24/2014 with Neulasta support ? Cycle 6 FOLFOX 05/08/2014 ? Cycle 7 FOLFOX 05/22/2014 with Neulasta support ? Cycle 8  FOLFOX 06/05/2014 (oxaliplatin held secondary to thrombocytopenia) ? Cycle 9 FOLFOX 06/19/2014 with Neulasta support ? Cycle 10 FOLFOX 07/03/2014 (oxaliplatin held secondary to thrombocytopenia) ? Cycle 11 FOLFOX 07/17/2014 ? Cycle 12 FOLFOX 07/31/2014 (oxaliplatin dose reduced due to thrombocytopenia ? CTs chest, abdomen, and pelvis on 01/05/2015-no evidence of recurrent disease ? Colonoscopy 09/15/2015-tubular adenoma removed from the transverse colon ? CTs chest, abdomen, pelvis on 01/14/2016-no evidence of recurrent disease ? CTs 01/12/2017-no evidence of recurrent cancer. ? Colonoscopy 10/25/2017-1 cm deep firm ulceration on the colonic side distal aspect of the anastomosis.  Pathology-adenocarcinoma ? CEA 11/15/2017-2.2 ? CT abdomen/pelvis 11/16/2017- no evidence of recurrent or metastatic carcinoma.  Stable small hiatal hernia and periumbilical ventral hernia.  Stable benign bilateral adrenal lesions. ? Resection of anastomosis 01/11/2018-adenocarcinoma, recurrent, moderately differentiated (2 cm) involving the prior anastomotic site; tumor invades muscularis propria; no carcinoma identified in 7 lymph nodes.  Margins negative.  2. History of Iron deficiency anemia 3. Family history of breast cancer 4. Port-A-Cath placement 02/18/2014 5. Delayed nausea following cycle 1 FOLFOX. Aloxi and Emend added beginning with cycle 2. Prophylactic Decadron added beginning with cycle 3. 6. Neutropenia following cycle 4 FOLFOX. Neulasta was added with cycle 5. 7. Hypokalemia 04/17/2014. Question secondary to diarrhea. Potassium supplement initiated. 8. Left upper arm pain. Negative venous Doppler 03/07/2014. She was referred to orthopedics. 9. Thrombocytopenia secondary to chemotherapy 10. Mild oxaliplatin neuropathy 11. Hypertension  12. 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 Right facial cellulitis 07/15/2015-treated  with doxycycline   Disposition: Gail Carlson  appears stable.  She was recently found to have a recurrence at the anastomosis and underwent resection of this 01/11/2018.  We reviewed the pathology report with her at today's visit.  Dr. Benay Spice does not recommend further treatment.  We will follow-up on the CEA from today.  We are referring her for a noncontrast CT scan of the chest.  She will return for a CEA and follow-up visit in 6 months.  She will contact the office in the interim with any problems.  Patient seen with Dr. Benay Spice.    Ned Card ANP/GNP-BC   02/01/2018  10:09 AM This was a shared visit with Ned Card.  Gail Carlson has been diagnosed with a local recurrence of colon cancer.  There is no clinical evidence of distant metastatic disease.  She received adjuvant FOLFOX chemotherapy when she was diagnosed with colon cancer in 2015.  I do not recommend adjuvant chemotherapy.  [name], MD

## 2018-02-15 ENCOUNTER — Telehealth: Payer: Self-pay

## 2018-02-15 NOTE — Telephone Encounter (Signed)
  Oncology Nurse Navigator Documentation    Called patient and provided her with phone number to central scheduling. CS had reached out to patient and left message for call back which patient had not received. Patient to call and schedule CT.

## 2018-02-22 ENCOUNTER — Ambulatory Visit (HOSPITAL_COMMUNITY)
Admission: RE | Admit: 2018-02-22 | Discharge: 2018-02-22 | Disposition: A | Discharge status: Home / Self Care | Payer: Medicare HMO | Source: Ambulatory Visit | Attending: Nurse Practitioner | Admitting: Nurse Practitioner

## 2018-02-22 DIAGNOSIS — C189 Malignant neoplasm of colon, unspecified: Secondary | ICD-10-CM | POA: Diagnosis not present

## 2018-02-23 ENCOUNTER — Telehealth: Payer: Self-pay | Admitting: *Deleted

## 2018-02-23 NOTE — Telephone Encounter (Signed)
Radiologist from [place] called results for Ned Card, NP of CT Chest w/o contrast for patient.  Per Ned Card, NP after reviewing them requested that we send to Dr [name] her surgeon for review.  Completed

## 2018-03-15 ENCOUNTER — Telehealth: Payer: Self-pay | Admitting: Genetics

## 2018-03-15 NOTE — Telephone Encounter (Signed)
Left message for patient regarding genetic testing.   I explained that her case was reviewed (at [facility]) and per Dr. Carin Hock request we reach out to her regarding genetic testing- she had met with a genetic counselor several years ago but cancelled her genetic test due to billing issues.  I informed her that coverage is better now and with her insurance the cost for the genetic lab test is estimated at $0.  We just wanted to touch base with her to let her know this updated information.  Left my contact information if she wanted to discuss further or was interested in proceeding with genetic counseling/ testing.

## 2018-03-15 NOTE — Telephone Encounter (Signed)
Patient called back and we discussed genetic testing/counseling.   I reviewed genetic counseling/genetic testing would be appropriate for her due to her personal and family history of cancer to determine if there is a genetic cause for her cancer/ a genetic change in the family causing people to be a higher risk for cancer.    She remembered meeting to talk about this years ago and remembers deciding against it at that time.  She declined to make an appointment for genetics at this time.  She says might consider it now, but would like to think about it first. We emphasized we respect her decision either way, we just wanted to inform her that the billing/coverage of this test had changed so she know this was not a barrier anymore at this time.   She also expressed a desire for psychosocial counseling- we told here there are some resources here for that as well or social work could provide information/refer her.   She asked for a phone number she could call for either of these services if she decided she was interested in the future.  I gave her the family support services secretary number she could call for information about either service in the future.

## 2018-06-08 ENCOUNTER — Other Ambulatory Visit: Payer: Self-pay | Admitting: Internal Medicine

## 2018-06-08 DIAGNOSIS — Z1231 Encounter for screening mammogram for malignant neoplasm of breast: Secondary | ICD-10-CM

## 2018-08-01 ENCOUNTER — Ambulatory Visit
Admission: RE | Admit: 2018-08-01 | Discharge: 2018-08-01 | Disposition: A | Discharge status: Home / Self Care | Payer: Medicare HMO | Source: Ambulatory Visit | Attending: Internal Medicine | Admitting: Internal Medicine

## 2018-08-01 ENCOUNTER — Other Ambulatory Visit: Payer: Self-pay

## 2018-08-01 DIAGNOSIS — Z1231 Encounter for screening mammogram for malignant neoplasm of breast: Secondary | ICD-10-CM

## 2018-08-06 ENCOUNTER — Inpatient Hospital Stay: Payer: Medicare HMO | Admitting: Nurse Practitioner

## 2018-08-06 ENCOUNTER — Inpatient Hospital Stay: Payer: Medicare HMO

## 2018-08-13 ENCOUNTER — Inpatient Hospital Stay: Payer: Medicare HMO

## 2018-08-13 ENCOUNTER — Encounter: Payer: Self-pay | Admitting: Nurse Practitioner

## 2018-08-13 ENCOUNTER — Other Ambulatory Visit: Payer: Self-pay

## 2018-08-13 ENCOUNTER — Inpatient Hospital Stay: Payer: Medicare HMO | Attending: Nurse Practitioner | Admitting: Nurse Practitioner

## 2018-08-13 VITALS — BP 140/68 | HR 73 | Temp 98.7°F | Resp 17 | Ht 63.0 in | Wt 178.3 lb

## 2018-08-13 DIAGNOSIS — C189 Malignant neoplasm of colon, unspecified: Secondary | ICD-10-CM

## 2018-08-13 DIAGNOSIS — Z85038 Personal history of other malignant neoplasm of large intestine: Secondary | ICD-10-CM | POA: Insufficient documentation

## 2018-08-13 DIAGNOSIS — Z9221 Personal history of antineoplastic chemotherapy: Secondary | ICD-10-CM | POA: Diagnosis not present

## 2018-08-13 DIAGNOSIS — I10 Essential (primary) hypertension: Secondary | ICD-10-CM | POA: Insufficient documentation

## 2018-08-13 DIAGNOSIS — Z803 Family history of malignant neoplasm of breast: Secondary | ICD-10-CM | POA: Diagnosis not present

## 2018-08-13 DIAGNOSIS — Z79899 Other long term (current) drug therapy: Secondary | ICD-10-CM

## 2018-08-13 DIAGNOSIS — C180 Malignant neoplasm of cecum: Secondary | ICD-10-CM | POA: Insufficient documentation

## 2018-08-13 DIAGNOSIS — D509 Iron deficiency anemia, unspecified: Secondary | ICD-10-CM | POA: Diagnosis not present

## 2018-08-13 LAB — CEA (IN HOUSE-CHCC): CEA (CHCC-In House): 3.17 ng/mL (ref 0.00–5.00)

## 2018-08-13 LAB — BASIC METABOLIC PANEL - CANCER CENTER ONLY
Anion gap: 6 (ref 5–15)
BUN: 19 mg/dL (ref 8–23)
CO2: 29 mmol/L (ref 22–32)
Calcium: 8.6 mg/dL — ABNORMAL LOW (ref 8.9–10.3)
Chloride: 108 mmol/L (ref 98–111)
Creatinine: 1.33 mg/dL — ABNORMAL HIGH (ref 0.44–1.00)
GFR, Est AFR Am: 47 mL/min — ABNORMAL LOW (ref >60)
GFR, Estimated: 41 mL/min — ABNORMAL LOW (ref >60)
Glucose, Bld: 88 mg/dL (ref 70–99)
Potassium: 4.1 mmol/L (ref 3.5–5.1)
Sodium: 143 mmol/L (ref 135–145)

## 2018-08-13 NOTE — Progress Notes (Signed)
Per [name] scheduled patient for CT tomorrow 08/14/18 at 10am here Gail Carlson. Patient is to arrive by 9:45am, be NPO 4 hours before, drink first contrast bottle at 8:00am and second bottle at 9:00am. I wrote down these instructions for the patient so patient is aware of appointment time, date and instructions. Also gave patient 2 bottles on contrast before leaving. Patient verbalized understanding of all instructions.

## 2018-08-13 NOTE — Progress Notes (Addendum)
Gail Carlson OFFICE PROGRESS NOTE   Diagnosis: Colon cancer  INTERVAL HISTORY:   Gail Carlson returns as scheduled.  No change in bowel habits.  No rectal bleeding.  She has abdominal discomfort which she relates to a hernia.  She notes persistent fullness throughout the mid abdomen.  This has been present per her report since December 2019.  She reports a good appetite.  No fever, cough, shortness of breath.  Objective:  Vital signs in last 24 hours:  Blood pressure 140/68, pulse 73, temperature 98.7 F (37.1 C), temperature source Oral, resp. rate 17, height '5\' 3"'  (1.6 m), weight 178 lb 4.8 oz (80.9 kg), SpO2 98 %.    HEENT: Neck without mass. Lymphatics: No palpable cervical, supraclavicular, axillary or inguinal lymph nodes. Resp: Lungs clear bilaterally. Cardio: Regular rate and rhythm. GI: No hepatomegaly.  Somewhat firm protuberant fullness throughout the mid abdomen, question abdominal hernia. Vascular: No leg edema.  Lab Results:  Lab Results  Component Value Date   WBC 14.0 (H) 01/12/2018   HGB 11.4 (L) 01/12/2018   HCT 35.8 (L) 01/12/2018   MCV 95.7 01/12/2018   PLT 200 01/12/2018   NEUTROABS 3.3 11/15/2017    Imaging:  No results found.  Medications: I have reviewed the patient's current medications.  Assessment/Plan: 1. 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 ? CTs of the chest, abdomen, and pelvis on 01/10/2014 with a cecal mass, nonspecific small liver lesions, and nonspecific lung nodules felt to most likely represent mucoid impaction ? Cycle 1 FOLFOX 02/20/2014 ? Cycle 2 FOLFOX 03/06/2014 ? Cycle 3 FOLFOX 03/20/2014 ? Cycle 4 FOLFOX 04/03/2014 ? Cycle 5 FOLFOX held on 04/17/2014 due to neutropenia ? Cycle 5 FOLFOX 04/24/2014 with Neulasta support ? Cycle 6 FOLFOX  05/08/2014 ? Cycle 7 FOLFOX 05/22/2014 with Neulasta support ? Cycle 8 FOLFOX 06/05/2014 (oxaliplatin held secondary to thrombocytopenia) ? Cycle 9 FOLFOX 06/19/2014 with Neulasta support ? Cycle 10 FOLFOX 07/03/2014 (oxaliplatin held secondary to thrombocytopenia) ? Cycle 11 FOLFOX 07/17/2014 ? Cycle 12 FOLFOX 07/31/2014 (oxaliplatin dose reduced due to thrombocytopenia ? CTs chest, abdomen, and pelvis on 01/05/2015-no evidence of recurrent disease ? Colonoscopy 09/15/2015-tubular adenoma removed from the transverse colon ? CTs chest, abdomen, pelvis on 01/14/2016-no evidence of recurrent disease ? CTs 01/12/2017-no evidence of recurrent cancer. ? Colonoscopy 10/25/2017-1 cm deep firm ulceration on the colonic side distal aspect of the anastomosis.  Pathology-adenocarcinoma ? CEA 11/15/2017-2.2 ? CT abdomen/pelvis 11/16/2017- no evidence of recurrent or metastatic carcinoma.  Stable small hiatal hernia and periumbilical ventral hernia.  Stable benign bilateral adrenal lesions. ? Resection of anastomosis 01/11/2018-adenocarcinoma, recurrent, moderately differentiated (2 cm) involving the prior anastomotic site; tumor invades muscularis propria; no carcinoma identified in 7 lymph nodes.  Margins negative.  2. History of Iron deficiency anemia 3. Family history of breast cancer 4. Port-A-Cath placement 02/18/2014 5. Delayed nausea following cycle 1 FOLFOX. Aloxi and Emend added beginning with cycle 2. Prophylactic Decadron added beginning with cycle 3. 6. Neutropenia following cycle 4 FOLFOX. Neulasta was added with cycle 5. 7. Hypokalemia 04/17/2014. Question secondary to diarrhea. Potassium supplement initiated. 8. Left upper arm pain. Negative venous Doppler 03/07/2014. She was referred to orthopedics. 9. Thrombocytopenia secondary to chemotherapy 10. Mild oxaliplatin neuropathy 11. Hypertension  12. 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 Right facial cellulitis  07/15/2015-treated with doxycycline   Disposition: Gail Carlson appears stable.  She remains in clinical remission from colon cancer.  We will follow-up on the CEA from today.    She appears to have a large abdominal hernia, per her report unchanged over many months.  She has follow-up with Dr. [name] later this week.  We are referring her for surveillance CT scans/evaluation of possible hernia which can hopefully be done prior to her appointment with Dr. [name].  She will return for a CEA and follow-up visit in 3 months.  She will contact the office in the interim with any problems.  Patient seen with Dr. Benay Spice.    Ned Card ANP/GNP-BC   08/13/2018  2:27 PM This was a shared visit with Ned Card.  Gail Carlson was interviewed and examined.  There is a firm midline abdominal fullness.  This is not reducible.  This could be related to a hernia.  She will be referred for a CT scan and is scheduled to see Dr. [name] this week.  [name], MD

## 2018-08-14 ENCOUNTER — Ambulatory Visit (HOSPITAL_COMMUNITY)
Admission: RE | Admit: 2018-08-14 | Discharge: 2018-08-14 | Disposition: A | Discharge status: Home / Self Care | Payer: Medicare HMO | Source: Ambulatory Visit | Attending: Nurse Practitioner | Admitting: Nurse Practitioner

## 2018-08-14 ENCOUNTER — Ambulatory Visit (HOSPITAL_COMMUNITY): Admission: RE | Admit: 2018-08-14 | Payer: Medicare HMO | Source: Ambulatory Visit

## 2018-08-14 ENCOUNTER — Telehealth: Payer: Self-pay | Admitting: Nurse Practitioner

## 2018-08-14 DIAGNOSIS — C189 Malignant neoplasm of colon, unspecified: Secondary | ICD-10-CM | POA: Insufficient documentation

## 2018-08-14 MED ORDER — IOHEXOL 300 MG/ML  SOLN
100.0000 mL | Freq: Once | INTRAMUSCULAR | Status: AC | PRN
  Administered 2018-08-14: 11:00:00 100 mL via INTRAVENOUS

## 2018-08-14 MED ORDER — SODIUM CHLORIDE (PF) 0.9 % IJ SOLN
INTRAMUSCULAR | Status: AC
  Filled 2018-08-14: qty 50

## 2018-08-14 NOTE — Telephone Encounter (Signed)
Called and spoke with patient. Confirmed date and time of upcoming appt

## 2018-08-31 ENCOUNTER — Other Ambulatory Visit: Payer: Self-pay | Admitting: Surgery

## 2018-08-31 ENCOUNTER — Other Ambulatory Visit (HOSPITAL_COMMUNITY): Payer: Self-pay | Admitting: Surgery

## 2018-08-31 DIAGNOSIS — Z9049 Acquired absence of other specified parts of digestive tract: Secondary | ICD-10-CM

## 2018-09-06 ENCOUNTER — Other Ambulatory Visit: Payer: Self-pay | Admitting: Radiology

## 2018-09-07 ENCOUNTER — Other Ambulatory Visit: Payer: Self-pay

## 2018-09-07 ENCOUNTER — Ambulatory Visit (HOSPITAL_COMMUNITY)
Admission: RE | Admit: 2018-09-07 | Discharge: 2018-09-07 | Disposition: A | Discharge status: Home / Self Care | Payer: Medicare HMO | Source: Ambulatory Visit | Attending: Surgery | Admitting: Surgery

## 2018-09-07 DIAGNOSIS — S301XXA Contusion of abdominal wall, initial encounter: Secondary | ICD-10-CM | POA: Insufficient documentation

## 2018-09-07 DIAGNOSIS — I10 Essential (primary) hypertension: Secondary | ICD-10-CM | POA: Diagnosis not present

## 2018-09-07 DIAGNOSIS — Z85038 Personal history of other malignant neoplasm of large intestine: Secondary | ICD-10-CM | POA: Diagnosis not present

## 2018-09-07 DIAGNOSIS — R188 Other ascites: Secondary | ICD-10-CM | POA: Insufficient documentation

## 2018-09-07 DIAGNOSIS — F17210 Nicotine dependence, cigarettes, uncomplicated: Secondary | ICD-10-CM | POA: Diagnosis not present

## 2018-09-07 DIAGNOSIS — Z923 Personal history of irradiation: Secondary | ICD-10-CM | POA: Diagnosis not present

## 2018-09-07 DIAGNOSIS — X58XXXA Exposure to other specified factors, initial encounter: Secondary | ICD-10-CM | POA: Insufficient documentation

## 2018-09-07 DIAGNOSIS — Z9221 Personal history of antineoplastic chemotherapy: Secondary | ICD-10-CM | POA: Insufficient documentation

## 2018-09-07 DIAGNOSIS — Z9049 Acquired absence of other specified parts of digestive tract: Secondary | ICD-10-CM | POA: Diagnosis not present

## 2018-09-07 DIAGNOSIS — Z79899 Other long term (current) drug therapy: Secondary | ICD-10-CM | POA: Diagnosis not present

## 2018-09-07 LAB — CBC
HCT: 38.7 % (ref 36.0–46.0)
Hemoglobin: 12 g/dL (ref 12.0–15.0)
MCH: 26.8 pg (ref 26.0–34.0)
MCHC: 31 g/dL (ref 30.0–36.0)
MCV: 86.4 fL (ref 80.0–100.0)
Platelets: 308 10*3/uL (ref 150–400)
RBC: 4.48 MIL/uL (ref 3.87–5.11)
RDW: 15.8 % — ABNORMAL HIGH (ref 11.5–15.5)
WBC: 6.7 10*3/uL (ref 4.0–10.5)
nRBC: 0 % (ref 0.0–0.2)

## 2018-09-07 LAB — PROTIME-INR
INR: 1 (ref 0.8–1.2)
Prothrombin Time: 12.9 seconds (ref 11.4–15.2)

## 2018-09-07 MED ORDER — MIDAZOLAM HCL 2 MG/2ML IJ SOLN
INTRAMUSCULAR | Status: AC
  Filled 2018-09-07: qty 2

## 2018-09-07 MED ORDER — HYDROCODONE-ACETAMINOPHEN 5-325 MG PO TABS: 1.0000 | ORAL_TABLET | ORAL | Status: DC | PRN

## 2018-09-07 MED ORDER — FENTANYL CITRATE (PF) 100 MCG/2ML IJ SOLN
INTRAMUSCULAR | Status: AC | PRN
  Administered 2018-09-07: 25 ug via INTRAVENOUS
  Administered 2018-09-07: 50 ug via INTRAVENOUS

## 2018-09-07 MED ORDER — SODIUM CHLORIDE 0.9 % IV SOLN: INTRAVENOUS | Status: DC

## 2018-09-07 MED ORDER — LIDOCAINE HCL (PF) 1 % IJ SOLN
INTRAMUSCULAR | Status: AC
  Filled 2018-09-07: qty 30

## 2018-09-07 MED ORDER — FENTANYL CITRATE (PF) 100 MCG/2ML IJ SOLN
INTRAMUSCULAR | Status: AC
  Filled 2018-09-07: qty 2

## 2018-09-07 MED ORDER — MIDAZOLAM HCL 5 MG/5ML IJ SOLN
INTRAMUSCULAR | Status: AC | PRN
  Administered 2018-09-07: 1 mg via INTRAVENOUS

## 2018-09-07 MED ORDER — MIDAZOLAM HCL 2 MG/2ML IJ SOLN
INTRAMUSCULAR | Status: AC | PRN
  Administered 2018-09-07: 0.5 mg via INTRAVENOUS

## 2018-09-07 NOTE — H&P (Signed)
Chief Complaint: Patient was seen in consultation today for abdominal wall fluid collection aspiration at the request of [name],[name]  Referring Physician(s): [name],[name]  Supervising Physician: Markus Daft  Patient Status: [facility] - Out-pt  History of Present Illness: Gail Carlson is a 68 y.o. female with underlying hx of colon cancer and had more recent ventral hernia repair. She has developed abdominal wall fluid collection. She is referred by Dr. [name] for US guided aspiration. PMHx, meds, labs, imaging, allergies reviewed. Feels well, no recent fevers, chills, illness. Has been NPO today as directed.   Past Medical History:  Diagnosis Date  . Allergy   . Anemia   . Anxiety   . Arthritis    shoulders- oxycodone prn   . Colon cancer ([place])    colon cancer  . Constipation    uses laxative prn   . Depression   . Family history of breast cancer   . GERD (gastroesophageal reflux disease)    no meds  . History of chemotherapy 2015  . History of radiation therapy 2015  . Hypertension   . Neuromuscular disorder ([place])    hernia  . Neuropathy    under the heel part of foot     Past Surgical History:  Procedure Laterality Date  . APPENDECTOMY    . COLON RESECTION N/A 01/11/2018   Procedure: OPEN COLON RESECTION;  Surgeon: Armandina Gemma, MD;  Location: [facility];  Service: General;  Laterality: N/A;  . COLONOSCOPY N/A 01/10/2014   Procedure: COLONOSCOPY;  Surgeon: Irene Shipper, MD;  Location: [facility];  Service: Endoscopy;  Laterality: N/A;  . COLONOSCOPY    . ESOPHAGOGASTRODUODENOSCOPY N/A 01/10/2014   Procedure: ESOPHAGOGASTRODUODENOSCOPY (EGD);  Surgeon: Irene Shipper, MD;  Location: [facility];  Service: Endoscopy;  Laterality: N/A;  . INCISIONAL HERNIA REPAIR N/A 01/11/2018   Procedure: HERNIA REPAIR INCISIONAL;  Surgeon: Armandina Gemma, MD;  Location: [facility];  Service: General;  Laterality: N/A;  . LAPAROTOMY N/A 01/11/2014   Procedure: EXPLORATORY LAPAROTOMY;   Surgeon: Armandina Gemma, MD;  Location: [facility];  Service: General;  Laterality: N/A;  . PARTIAL COLECTOMY N/A 01/11/2014   Procedure: PARTIAL COLECTOMY;  Surgeon: Armandina Gemma, MD;  Location: [facility];  Service: General;  Laterality: N/A;  . PORT-A-CATH REMOVAL Left 03/24/2015   Procedure: REMOVAL PORT-A-CATH;  Surgeon: Armandina Gemma, MD;  Location: [facility];  Service: General;  Laterality: Left;  . PORTACATH PLACEMENT N/A 02/18/2014   Procedure: INSERTION PORT-A-CATH LEFT SUBCLAVIAN VIEN;  Surgeon: Armandina Gemma, MD;  Location: [place];  Service: General;  Laterality: N/A;  . SAVORY DILATION N/A 01/10/2014   Procedure: SAVORY DILATION;  Surgeon: Irene Shipper, MD;  Location: [facility];  Service: Endoscopy;  Laterality: N/A;  Please verify with Henrene Pastor what type of dilation he will use  . UPPER GASTROINTESTINAL ENDOSCOPY      Allergies: Patient has no known allergies.  Medications: Prior to Admission medications   Medication Sig Start Date End Date Taking? Authorizing Provider  amLODipine (NORVASC) 10 MG tablet Take 1 tablet (10 mg total) by mouth daily. 04/17/14  Yes Owens Shark, NP  ferrous sulfate 325 (65 FE) MG tablet take 1 tablet by mouth twice a day with meals Patient taking differently: Take 325 mg by mouth every other day.  04/07/15  Yes Susanne Borders, NP  Hypromellose (ARTIFICIAL TEARS OP) Place 1 drop into both eyes daily as needed (for dry eyes).   Yes [provider]  Ibuprofen-diphenhydrAMINE HCl (ADVIL PM) 200-25 MG CAPS Take 1 tablet by mouth See admin instructions. Every other night at bedtime   Yes [provider]  meloxicam (MOBIC) 15 MG tablet Take 15 mg by mouth daily.  07/31/17  Yes [provider]  Multiple Vitamins-Minerals (MULTIVITAMIN WOMEN PO) Take 1 tablet by mouth 3 (three) times a week.    Yes [provider]  Cyanocobalamin (VITAMIN B-12 PO) Take 1 tablet by mouth 4 (four) times a week.    [provider]  lisinopril-hydrochlorothiazide (PRINZIDE,ZESTORETIC) 20-12.5 MG tablet Take 1 tablet by mouth every other day.     [provider]     Family History  Problem Relation Age of Onset  . Breast cancer Mother        Dx 81s; deceased 34  . Diabetes Mother   . Diabetes Brother   . Uterine cancer Maternal Aunt        Dx 72s; deceased 54s  . Breast cancer Cousin        daughter of mat aunt w/ uterine ca  . Breast cancer Cousin        daughter of mat aunt w/ uterine ca  . Breast cancer Cousin        daughter of mat aunt w/ uterine ca  . Colon cancer Neg Hx   . Colon polyps Neg Hx   . Esophageal cancer Neg Hx   . Rectal cancer Neg Hx   . Stomach cancer Neg Hx     Social History   Socioeconomic History  . Marital status: Widowed    Spouse name: Not on file  . Number of children: 2  . Years of education: Not on file  . Highest education level: Not on file  Occupational History  . Occupation: Retired  Scientific laboratory technician  . Financial resource strain: Not on file  . Food insecurity    Worry: Not on file    Inability: Not on file  . Transportation needs    Medical: Not on file    Non-medical: Not on file  Tobacco Use  . Smoking status: Current Some Day Smoker    Packs/day: 0.25    Types: Cigarettes  . Smokeless tobacco: Never Used  . Tobacco comment: 1 pack will last > 1 week   Substance and Sexual Activity  . Alcohol use: Yes    Alcohol/week: 5.0 standard drinks    Types: 2 Cans of beer, 3 Shots of liquor per week    Comment: occ  . Drug use: No  . Sexual activity: Not on file  Lifestyle  . Physical activity    Days per week: Not on file    Minutes per session: Not on file  . Stress: Not on file  Relationships  . Social Herbalist on phone: Not on file    Gets together: Not on file    Attends religious service: Not on file    Active member of club or organization: Not on file    Attends meetings of clubs or organizations: Not on file     Relationship status: Not on file  Other Topics Concern  . Not on file  Social History Narrative  . Not on file     Review of Systems: A 12 point ROS discussed and pertinent positives are indicated in the HPI above.  All other systems are negative.  Review of Systems  Vital Signs: BP (!) 184/93   Pulse 72   Temp (!)  97.3 F (36.3 C) (Temporal)   Resp 18   Ht 5\' 3"  (1.6 m)   Wt 82.6 kg   SpO2 100%   BMI 32.24 kg/m   Physical Exam Constitutional:      Appearance: Normal appearance.  HENT:     Mouth/Throat:     Mouth: Mucous membranes are moist.     Pharynx: Oropharynx is clear.  Cardiovascular:     Rate and Rhythm: Normal rate and regular rhythm.     Heart sounds: Normal heart sounds.  Pulmonary:     Effort: Pulmonary effort is normal. No respiratory distress.     Breath sounds: Normal breath sounds.  Abdominal:     Comments: Round midline mass-like fullness superior to umbilcus  Skin:    General: Skin is warm and dry.  Neurological:     General: No focal deficit present.     Mental Status: She is alert and oriented to person, place, and time.  Psychiatric:        Mood and Affect: Mood normal.        Judgment: Judgment normal.     Imaging: Ct Chest W Contrast  Result Date: 08/14/2018 CLINICAL DATA:  Colon cancer.  History of ventral hernia repair. EXAM: CT CHEST, ABDOMEN, AND PELVIS WITH CONTRAST TECHNIQUE: Multidetector CT imaging of the chest, abdomen and pelvis was performed following the standard protocol during bolus administration of intravenous contrast. CONTRAST:  140mL OMNIPAQUE IOHEXOL 300 MG/ML  SOLN COMPARISON:  Chest CT 02/22/2018.  Abdomen pelvis CT 11/16/2017. FINDINGS: CT CHEST FINDINGS Cardiovascular: The heart size is normal. No substantial pericardial effusion. Coronary artery calcification is evident. Atherosclerotic calcification is noted in the wall of the thoracic aorta. Mediastinum/Nodes: No mediastinal lymphadenopathy. No evidence for gross  hilar lymphadenopathy although assessment is limited by the lack of intravenous contrast on today's study. The esophagus has normal imaging features. There is no axillary lymphadenopathy. Lungs/Pleura: No suspicious pulmonary nodule or mass. No focal consolidation. No pleural effusion. Musculoskeletal: No worrisome lytic or sclerotic osseous abnormality. CT ABDOMEN PELVIS FINDINGS Hepatobiliary: Tiny low-density lesion posterior right liver is stable, compatible with cyst. There is no evidence for gallstones, gallbladder wall thickening, or pericholecystic fluid. No intrahepatic or extrahepatic biliary dilation. Pancreas: No focal mass lesion. No dilatation of the main duct. No intraparenchymal cyst. No peripancreatic edema. Spleen: Subtle 5 mm hypodensity in the central spleen is unchanged. Adrenals/Urinary Tract: Bilateral adrenal gland thickening with stable comparing back to a CT scan of 01/10/2014 consistent with benign etiology such as hyperplasia. Cortical scarring noted in the kidneys bilaterally. No evidence for hydroureter. The urinary bladder appears normal for the degree of distention. Stomach/Bowel: Tiny hiatal hernia. Stomach otherwise unremarkable. No gastric wall thickening. No evidence of outlet obstruction. Duodenum is normally positioned as is the ligament of Treitz. No small bowel wall thickening. No small bowel dilatation. Insert right hemicolectomy No gross colonic mass. No colonic wall thickening. Vascular/Lymphatic: There is abdominal aortic atherosclerosis without aneurysm. There is no gastrohepatic or hepatoduodenal ligament lymphadenopathy. No intraperitoneal or retroperitoneal lymphadenopathy. No pelvic sidewall lymphadenopathy. Reproductive: The uterus is unremarkable.  There is no adnexal mass. Other: No intraperitoneal free fluid. Musculoskeletal: 9.6 x 9.5 x 11.2 cm masslike collection of fluid is identified in the subcutaneous tissues of the anterior abdominal wall, at the midline in  just deep to the umbilical region. This has a well organized wall the demonstrates smooth thickening posteriorly. Attenuation of this lesion is too high to be simple fluid and may reflect hemorrhagic  or proteinaceous content. The lesion is contiguous with the right rectus musculature but no extension through the deep fascia of the anterior abdominal wall is evident. No worrisome lytic or sclerotic osseous abnormality. IMPRESSION: 1. 10 x 10 x 11 cm well organized collection of complex fluid identified in the midline anterior abdominal wall, deep to the umbilicus. This is similar to the chest CT of 02/22/2018. Differential considerations include chronic hematoma or complicated seroma. Given the history of colon cancer, metastatic disease is not excluded although the 6 months of stability would not favor a metastatic etiology. 2. Stable tiny hiatal hernia. 3. Stable bilateral adrenal thickening compatible with hyperplasia. 4.  Aortic Atherosclerois (ICD10-170.0) Electronically Signed   By: Misty Stanley M.D.   On: 08/14/2018 14:33   Ct Abdomen Pelvis W Contrast  Result Date: 08/14/2018 CLINICAL DATA:  Colon cancer.  History of ventral hernia repair. EXAM: CT CHEST, ABDOMEN, AND PELVIS WITH CONTRAST TECHNIQUE: Multidetector CT imaging of the chest, abdomen and pelvis was performed following the standard protocol during bolus administration of intravenous contrast. CONTRAST:  114mL OMNIPAQUE IOHEXOL 300 MG/ML  SOLN COMPARISON:  Chest CT 02/22/2018.  Abdomen pelvis CT 11/16/2017. FINDINGS: CT CHEST FINDINGS Cardiovascular: The heart size is normal. No substantial pericardial effusion. Coronary artery calcification is evident. Atherosclerotic calcification is noted in the wall of the thoracic aorta. Mediastinum/Nodes: No mediastinal lymphadenopathy. No evidence for gross hilar lymphadenopathy although assessment is limited by the lack of intravenous contrast on today's study. The esophagus has normal imaging features.  There is no axillary lymphadenopathy. Lungs/Pleura: No suspicious pulmonary nodule or mass. No focal consolidation. No pleural effusion. Musculoskeletal: No worrisome lytic or sclerotic osseous abnormality. CT ABDOMEN PELVIS FINDINGS Hepatobiliary: Tiny low-density lesion posterior right liver is stable, compatible with cyst. There is no evidence for gallstones, gallbladder wall thickening, or pericholecystic fluid. No intrahepatic or extrahepatic biliary dilation. Pancreas: No focal mass lesion. No dilatation of the main duct. No intraparenchymal cyst. No peripancreatic edema. Spleen: Subtle 5 mm hypodensity in the central spleen is unchanged. Adrenals/Urinary Tract: Bilateral adrenal gland thickening with stable comparing back to a CT scan of 01/10/2014 consistent with benign etiology such as hyperplasia. Cortical scarring noted in the kidneys bilaterally. No evidence for hydroureter. The urinary bladder appears normal for the degree of distention. Stomach/Bowel: Tiny hiatal hernia. Stomach otherwise unremarkable. No gastric wall thickening. No evidence of outlet obstruction. Duodenum is normally positioned as is the ligament of Treitz. No small bowel wall thickening. No small bowel dilatation. Insert right hemicolectomy No gross colonic mass. No colonic wall thickening. Vascular/Lymphatic: There is abdominal aortic atherosclerosis without aneurysm. There is no gastrohepatic or hepatoduodenal ligament lymphadenopathy. No intraperitoneal or retroperitoneal lymphadenopathy. No pelvic sidewall lymphadenopathy. Reproductive: The uterus is unremarkable.  There is no adnexal mass. Other: No intraperitoneal free fluid. Musculoskeletal: 9.6 x 9.5 x 11.2 cm masslike collection of fluid is identified in the subcutaneous tissues of the anterior abdominal wall, at the midline in just deep to the umbilical region. This has a well organized wall the demonstrates smooth thickening posteriorly. Attenuation of this lesion is too  high to be simple fluid and may reflect hemorrhagic or proteinaceous content. The lesion is contiguous with the right rectus musculature but no extension through the deep fascia of the anterior abdominal wall is evident. No worrisome lytic or sclerotic osseous abnormality. IMPRESSION: 1. 10 x 10 x 11 cm well organized collection of complex fluid identified in the midline anterior abdominal wall, deep to the umbilicus. This is similar  to the chest CT of 02/22/2018. Differential considerations include chronic hematoma or complicated seroma. Given the history of colon cancer, metastatic disease is not excluded although the 6 months of stability would not favor a metastatic etiology. 2. Stable tiny hiatal hernia. 3. Stable bilateral adrenal thickening compatible with hyperplasia. 4.  Aortic Atherosclerois (ICD10-170.0) Electronically Signed   By: Misty Stanley M.D.   On: 08/14/2018 14:33    Labs:  CBC: Recent Labs    11/15/17 1032 01/05/18 1022 01/11/18 2042 01/12/18 0503  WBC 5.0 5.5 18.1* 14.0*  HGB 13.7 12.8 12.6 11.4*  HCT 41.1 40.9 40.2 35.8*  PLT 266.0 228 223 200    COAGS: No results for input(s): INR, APTT in the last 8760 hours.  BMP: Recent Labs    11/15/17 1032 01/05/18 1022 01/11/18 2042 01/12/18 0503 08/13/18 1454  NA 140 143  --  138 143  K 3.8 4.3  --  4.2 4.1  CL 106 108  --  105 108  CO2 28 29  --  25 29  GLUCOSE 142* 94  --  146* 88  BUN 19 24*  --  24* 19  CALCIUM 9.2 9.2  --  8.1* 8.6*  CREATININE 1.27* 1.30* 1.32* 1.34* 1.33*  GFRNONAA  --  42* 41* 40* 41*  GFRAA  --  48* 48* 47* 47*    LIVER FUNCTION TESTS: Recent Labs    11/15/17 1032  BILITOT 0.5  AST 12  ALT 8  ALKPHOS 79  PROT 7.1  ALBUMIN 4.1    TUMOR MARKERS: Recent Labs    11/15/17 1032  CEA 2.2    Assessment and Plan: Abd wall fluid collection. For US guided aspiration. Labs pending Risks and benefits discussed with the patient including bleeding, infection, damage to adjacent  structures, sepsis, risk of recurrence.  All of the patient's questions were answered, patient is agreeable to proceed. Consent signed and in chart.   Thank you for this interesting consult.  I greatly enjoyed meeting MICHELENA CULMER and look forward to participating in their care.  A copy of this report was sent to the requesting provider on this date.  Electronically Signed: Ascencion Dike, PA-C 09/07/2018, 12:03 PM   I spent a total of 20 minutes in face to face in clinical consultation, greater than 50% of which was counseling/coordinating care for abd wall fluid collection aspiration

## 2018-09-07 NOTE — Procedures (Signed)
Interventional Radiology Procedure:   Indications: History of colon cancer and ventral hernia repair.  Large anterior abdominal wall fluid collection  Procedure: US guided aspiration  Findings: Large complex hematoma.  Removed 175 ml and aspirated multiple different pockets.    Complications: None     EBL: less than 10 ml  Plan: Send fluid for culture and cytology.  Discharge to home.     Tatym Schermer R. Anselm Pancoast, MD  Pager: [phone]

## 2018-09-07 NOTE — Discharge Instructions (Signed)
Needle Biopsy, Care After °These instructions tell you how to care for yourself after your procedure. Your doctor may also give you more specific instructions. Call your doctor if you have any problems or questions. °What can I expect after the procedure? °After the procedure, it is common to have: °· Soreness. °· Bruising. °· Mild pain. °Follow these instructions at home: ° °· Return to your normal activities as told by your doctor. Ask your doctor what activities are safe for you. °· Take over-the-counter and prescription medicines only as told by your doctor. °· Wash your hands with soap and water before you change your bandage (dressing). If you cannot use soap and water, use hand sanitizer. °· Follow instructions from your doctor about: °? How to take care of your puncture site. °? When and how to change your bandage. °? When to remove your bandage. °· Check your puncture site every day for signs of infection. Watch for: °? Redness, swelling, or pain. °? Fluid or blood.  °? Pus or a bad smell. °? Warmth. °· Do not take baths, swim, or use a hot tub until your doctor approves. Ask your doctor if you may take showers. You may only be allowed to take sponge baths. °· Keep all follow-up visits as told by your doctor. This is important. °Contact a doctor if you have: °· A fever. °· Redness, swelling, or pain at the puncture site, and it lasts longer than a few days. °· Fluid, blood, or pus coming from the puncture site. °· Warmth coming from the puncture site. °Get help right away if: °· You have a lot of bleeding from the puncture site. °Summary °· After the procedure, it is common to have soreness, bruising, or mild pain at the puncture site. °· Check your puncture site every day for signs of infection, such as redness, swelling, or pain. °· Get help right away if you have severe bleeding from your puncture site. °This information is not intended to replace advice given to you by your health care provider. Make  sure you discuss any questions you have with your health care provider. °Document Released: 12/31/2007 Document Revised: 01/30/2017 Document Reviewed: 01/30/2017 °Elsevier Patient Education © 2020 [employer] ° °

## 2018-09-10 ENCOUNTER — Encounter: Payer: Self-pay | Admitting: Gastroenterology

## 2018-09-10 LAB — BODY FLUID CULTURE: Culture: NO GROWTH

## 2018-09-17 ENCOUNTER — Encounter: Payer: Self-pay | Admitting: Gastroenterology

## 2018-10-16 ENCOUNTER — Other Ambulatory Visit: Payer: Self-pay

## 2018-10-16 ENCOUNTER — Ambulatory Visit (AMBULATORY_SURGERY_CENTER): Payer: Self-pay

## 2018-10-16 VITALS — Ht 63.0 in | Wt 180.0 lb

## 2018-10-16 DIAGNOSIS — Z85038 Personal history of other malignant neoplasm of large intestine: Secondary | ICD-10-CM

## 2018-10-16 MED ORDER — NA SULFATE-K SULFATE-MG SULF 17.5-3.13-1.6 GM/177ML PO SOLN: 1.0000 | Freq: Once | ORAL | 0 refills | Status: AC

## 2018-10-16 NOTE — Progress Notes (Signed)
Denies allergies to eggs or soy products. Denies complication of anesthesia or sedation. Denies use of weight loss medication. Denies use of O2.   Emmi instructions given for colonoscopy.   Pharmacy confirmed that the patients co-pay for Suprep was going to be 45.00. Patient states she does not have 45.00 for a co-pay. A sample was given to the patient. Patient is doing a 2 day prep per Dr. Fuller Plan and the patient states she can cover the other part of the prep.

## 2018-10-17 ENCOUNTER — Encounter: Payer: Self-pay | Admitting: Gastroenterology

## 2018-10-29 ENCOUNTER — Telehealth: Payer: Self-pay

## 2018-10-29 NOTE — Telephone Encounter (Signed)
Covid-19 screening questions   Do you now or have you had a fever in the last 14 days? NO   Do you have any respiratory symptoms of shortness of breath or cough now or in the last 14 days? NO  Do you have any family members or close contacts with diagnosed or suspected Covid-19 in the past 14 days? NO  Have you been tested for Covid-19 and found to be positive? NO        

## 2018-10-30 ENCOUNTER — Ambulatory Visit (AMBULATORY_SURGERY_CENTER): Payer: Medicare HMO | Admitting: Gastroenterology

## 2018-10-30 ENCOUNTER — Other Ambulatory Visit: Payer: Self-pay

## 2018-10-30 ENCOUNTER — Encounter: Payer: Self-pay | Admitting: Gastroenterology

## 2018-10-30 VITALS — BP 150/78 | HR 63 | Temp 99.1°F | Resp 14 | Ht 63.0 in | Wt 180.0 lb

## 2018-10-30 DIAGNOSIS — Z85038 Personal history of other malignant neoplasm of large intestine: Secondary | ICD-10-CM | POA: Diagnosis not present

## 2018-10-30 DIAGNOSIS — K9189 Other postprocedural complications and disorders of digestive system: Secondary | ICD-10-CM | POA: Diagnosis not present

## 2018-10-30 MED ORDER — SODIUM CHLORIDE 0.9 % IV SOLN: 500.0000 mL | Freq: Once | INTRAVENOUS | Status: DC

## 2018-10-30 NOTE — Progress Notes (Signed)
Report given to PACU, vss 

## 2018-10-30 NOTE — Progress Notes (Signed)
Called to room to assist during endoscopic procedure.  Patient ID and intended procedure confirmed with present staff. Received instructions for my participation in the procedure from the performing physician.  

## 2018-10-30 NOTE — Progress Notes (Signed)
1120 Pt experienced vomiting with secretions coming out of nose, then experienced laryngeal spasm with jaw thrust and PPV. Nasal tube placed. O2 sat > 90% .

## 2018-10-30 NOTE — Op Note (Addendum)
[place] Patient Name: Gail Carlson Procedure Date: 10/30/2018 10:51 AM MRN: [id number] Endoscopist: Ladene Artist , MD Age: 68 Referring MD:  Date of Birth: 1950-02-13 Gender: Female Account #: [id number] Procedure:                Colonoscopy Indications:              High risk colon cancer surveillance: Personal                            history of recurrent colon cancer Medicines:                Monitored Anesthesia Care Procedure:                Pre-Anesthesia Assessment:                           - Prior to the procedure, a History and Physical                            was performed, and patient medications and                            allergies were reviewed. The patient's tolerance of                            previous anesthesia was also reviewed. The risks                            and benefits of the procedure and the sedation                            options and risks were discussed with the patient.                            All questions were answered, and informed consent                            was obtained. Prior Anticoagulants: The patient has                            taken no previous anticoagulant or antiplatelet                            agents. ASA Grade Assessment: II - A patient with                            mild systemic disease. After reviewing the risks                            and benefits, the patient was deemed in                            satisfactory condition to undergo the procedure.  After obtaining informed consent, the colonoscope                            was passed under direct vision. Throughout the                            procedure, the patient's blood pressure, pulse, and                            oxygen saturations were monitored continuously. The                            Colonoscope was introduced through the anus and                            advanced to the the  ileocolonic anastomosis after                            changing to a peds colonoscopy. The patient                            tolerated the procedure well. The colonoscopy was                            somewhat difficult due to restricted mobility of                            the colon, a redundant colon and a tortuous colon                            espescially near the abdominal incision. The colon                            appeared to be attached to this area. Unable to                            pass this area with an adult colonoscopy so we                            changed to a peds colonoscope was able to advance                            beyond this area to the ileocecal anastomosis. Scope In: 11:06:27 AM Scope Out: 11:27:58 AM Scope Withdrawal Time: 0 hours 10 minutes 0 seconds  Total Procedure Duration: 0 hours 21 minutes 31 seconds  Findings:                 The perianal and digital rectal examinations were                            normal.                           There was evidence of  a prior side-to-side                            ileo-colonic anastomosis at the hepatic flexure.                            This was patent and was characterized by a                            superficial ulceration. The anastomosis was                            traversed. Biopsies were taken with a cold forceps                            for histology.                           Internal hemorrhoids were found during                            retroflexion. The hemorrhoids were small and Grade                            I (internal hemorrhoids that do not prolapse).                           The exam was otherwise normal throughout the                            examined colon. Complications:            No immediate complications. Estimated Blood Loss:     Estimated blood loss: none. Impression:               - Patent side-to-side ileo-colonic anastomosis,                             characterized by a superficial ulceration. Biopsied.                           - Internal hemorrhoids. Recommendation:           - Patient has a contact number available for                            emergencies. The signs and symptoms of potential                            delayed complications were discussed with the                            patient. Return to normal activities tomorrow.                            Written discharge instructions were provided to the  patient.                           - Resume previous diet.                           - Continue present medications.                           - Repeat colonoscopy in 2 years for surveillance.                           - Await pathology results. Ladene Artist, MD 10/30/2018 11:35:38 AM This report has been signed electronically.

## 2018-10-30 NOTE — Progress Notes (Signed)
Temperature- June Bullock VS- Courtney Washington  Pt's states no medical or surgical changes since previsit or office visit.  

## 2018-10-30 NOTE — Patient Instructions (Signed)
Thank you for letting us take care of your healthcare needs today. Please see handouts given to you on Polyps and Hemorrhoids.      YOU HAD AN ENDOSCOPIC PROCEDURE TODAY AT [facility]:   Refer to the procedure report that was given to you for any specific questions about what was found during the examination.  If the procedure report does not answer your questions, please call your gastroenterologist to clarify.  If you requested that your care partner not be given the details of your procedure findings, then the procedure report has been included in a sealed envelope for you to review at your convenience later.  YOU SHOULD EXPECT: Some feelings of bloating in the abdomen. Passage of more gas than usual.  Walking can help get rid of the air that was put into your GI tract during the procedure and reduce the bloating. If you had a lower endoscopy (such as a colonoscopy or flexible sigmoidoscopy) you may notice spotting of blood in your stool or on the toilet paper. If you underwent a bowel prep for your procedure, you may not have a normal bowel movement for a few days.  Please Note:  You might notice some irritation and congestion in your nose or some drainage.  This is from the oxygen used during your procedure.  There is no need for concern and it should clear up in a day or so.  SYMPTOMS TO REPORT IMMEDIATELY:   Following lower endoscopy (colonoscopy or flexible sigmoidoscopy):  Excessive amounts of blood in the stool  Significant tenderness or worsening of abdominal pains  Swelling of the abdomen that is new, acute  Fever of 100F or higher   For urgent or emergent issues, a gastroenterologist can be reached at any hour by calling [phone].   DIET:  We do recommend a small meal at first, but then you may proceed to your regular diet.  Drink plenty of fluids but you should avoid alcoholic beverages for 24 hours.  ACTIVITY:  You should plan to take it easy for  the rest of today and you should NOT DRIVE or use heavy machinery until tomorrow (because of the sedation medicines used during the test).    FOLLOW UP: Our staff will call the number listed on your records 48-72 hours following your procedure to check on you and address any questions or concerns that you may have regarding the information given to you following your procedure. If we do not reach you, we will leave a message.  We will attempt to reach you two times.  During this call, we will ask if you have developed any symptoms of COVID 19. If you develop any symptoms (ie: fever, flu-like symptoms, shortness of breath, cough etc.) before then, please call [phone].  If you test positive for Covid 19 in the 2 weeks post procedure, please call and report this information to [name].    If any biopsies were taken you will be contacted by phone or by letter within the next 1-3 weeks.  Please call us at [phone] if you have not heard about the biopsies in 3 weeks.    SIGNATURES/CONFIDENTIALITY: You and/or your care partner have signed paperwork which will be entered into your electronic medical record.  These signatures attest to the fact that that the information above on your After Visit Summary has been reviewed and is understood.  Full responsibility of the confidentiality of this discharge information lies with you and/or your care-partner.

## 2018-11-01 ENCOUNTER — Telehealth: Payer: Self-pay

## 2018-11-01 NOTE — Telephone Encounter (Signed)
Attempted to reach pt. For post-procedure f/u call. No answer. Left message for her to please not hesitate to call us if she has any questions/concerns regarding her care.

## 2018-11-01 NOTE — Telephone Encounter (Signed)
  Follow up Call-  Call back number 10/30/2018 10/25/2017  Post procedure Call Back phone  # [phone]  Permission to leave phone message Yes Yes  Some recent data might be hidden     Patient questions:  Do you have a fever, pain , or abdominal swelling? No. Pain Score  0 *  Have you tolerated food without any problems? Yes.    Have you been able to return to your normal activities? Yes.    Do you have any questions about your discharge instructions: Diet   No. Medications  No. Follow up visit  No.  Do you have questions or concerns about your Care? No.  Actions: * If pain score is 4 or above: No action needed, pain <4. 1. Have you developed a fever since your procedure? no  2.   Have you had an respiratory symptoms (SOB or cough) since your procedure? no  3.   Have you tested positive for COVID 19 since your procedure no  4.   Have you had any family members/close contacts diagnosed with the COVID 19 since your procedure?  no   If yes to any of these questions please route to Joylene John, RN and Alphonsa Gin, Therapist, sports.

## 2018-11-01 NOTE — Telephone Encounter (Signed)
Patient called and said that she was feeling good and nothing has been wrong.

## 2018-11-01 NOTE — Telephone Encounter (Signed)
Left message on answering machine. 

## 2018-11-06 ENCOUNTER — Encounter: Payer: Self-pay | Admitting: Gastroenterology

## 2018-11-15 ENCOUNTER — Inpatient Hospital Stay: Payer: Medicare HMO | Attending: Nurse Practitioner | Admitting: Oncology

## 2018-11-15 ENCOUNTER — Other Ambulatory Visit: Payer: Self-pay

## 2018-11-15 ENCOUNTER — Telehealth: Payer: Self-pay

## 2018-11-15 ENCOUNTER — Inpatient Hospital Stay: Payer: Medicare HMO

## 2018-11-15 VITALS — BP 157/95 | HR 68 | Temp 97.4°F | Resp 18 | Ht 63.0 in | Wt 180.0 lb

## 2018-11-15 DIAGNOSIS — T451X5A Adverse effect of antineoplastic and immunosuppressive drugs, initial encounter: Secondary | ICD-10-CM | POA: Diagnosis not present

## 2018-11-15 DIAGNOSIS — Z85830 Personal history of malignant neoplasm of bone: Secondary | ICD-10-CM | POA: Diagnosis not present

## 2018-11-15 DIAGNOSIS — Z85038 Personal history of other malignant neoplasm of large intestine: Secondary | ICD-10-CM | POA: Diagnosis present

## 2018-11-15 DIAGNOSIS — D6959 Other secondary thrombocytopenia: Secondary | ICD-10-CM | POA: Diagnosis not present

## 2018-11-15 DIAGNOSIS — Z9221 Personal history of antineoplastic chemotherapy: Secondary | ICD-10-CM | POA: Diagnosis not present

## 2018-11-15 DIAGNOSIS — C189 Malignant neoplasm of colon, unspecified: Secondary | ICD-10-CM

## 2018-11-15 DIAGNOSIS — K91872 Postprocedural seroma of a digestive system organ or structure following a digestive system procedure: Secondary | ICD-10-CM | POA: Diagnosis not present

## 2018-11-15 DIAGNOSIS — Z803 Family history of malignant neoplasm of breast: Secondary | ICD-10-CM | POA: Diagnosis not present

## 2018-11-15 DIAGNOSIS — G620 Drug-induced polyneuropathy: Secondary | ICD-10-CM | POA: Diagnosis not present

## 2018-11-15 DIAGNOSIS — I10 Essential (primary) hypertension: Secondary | ICD-10-CM | POA: Diagnosis not present

## 2018-11-15 LAB — CEA (IN HOUSE-CHCC): CEA (CHCC-In House): 2.51 ng/mL (ref 0.00–5.00)

## 2018-11-15 NOTE — Progress Notes (Signed)
[facility] OFFICE PROGRESS NOTE   Diagnosis: Colon cancer  INTERVAL HISTORY:   Gail Carlson returns as scheduled.  She feels well.  Good appetite and energy level.  She continues to have fullness in the mid abdomen.  The fluid collection was aspirated in August with negative cytology.  She underwent a colonoscopy on 09/09/2018.  Biopsy of ulceration at the anastomosis returned negative for malignancy.  Objective:  Vital signs in last 24 hours:  Blood pressure (!) 157/95, pulse 68, temperature (!) 97.4 F (36.3 C), temperature source Temporal, resp. rate 18, height 5' 3" (1.6 m), weight 180 lb (81.6 kg), SpO2 100 %.    Lymphatics: No cervical, supraclavicular, axillary, or inguinal nodes GI: No hepatosplenomegaly, masslike fullness at the anterior mid abdomen Vascular: No leg edema   Lab Results:  Lab Results  Component Value Date   WBC 6.7 09/07/2018   HGB 12.0 09/07/2018   HCT 38.7 09/07/2018   MCV 86.4 09/07/2018   PLT 308 09/07/2018   NEUTROABS 3.3 11/15/2017    CMP  Lab Results  Component Value Date   NA 143 08/13/2018   K 4.1 08/13/2018   CL 108 08/13/2018   CO2 29 08/13/2018   GLUCOSE 88 08/13/2018   BUN 19 08/13/2018   CREATININE 1.33 (H) 08/13/2018   CALCIUM 8.6 (L) 08/13/2018   PROT 7.1 11/15/2017   ALBUMIN 4.1 11/15/2017   AST 12 11/15/2017   ALT 8 11/15/2017   ALKPHOS 79 11/15/2017   BILITOT 0.5 11/15/2017   GFRNONAA 41 (L) 08/13/2018   GFRAA 47 (L) 08/13/2018    Lab Results  Component Value Date   CEA1 3.17 08/13/2018     Medications: I have reviewed the patient's current medications.   Assessment/Plan: 1. 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 ? CTs of the chest, abdomen, and pelvis on 01/10/2014 with a cecal mass, nonspecific small liver lesions, and  nonspecific lung nodules felt to most likely represent mucoid impaction ? Cycle 1 FOLFOX 02/20/2014 ? Cycle 2 FOLFOX 03/06/2014 ? Cycle 3 FOLFOX 03/20/2014 ? Cycle 4 FOLFOX 04/03/2014 ? Cycle 5 FOLFOX held on 04/17/2014 due to neutropenia ? Cycle 5 FOLFOX 04/24/2014 with Neulasta support ? Cycle 6 FOLFOX 05/08/2014 ? Cycle 7 FOLFOX 05/22/2014 with Neulasta support ? Cycle 8 FOLFOX 06/05/2014 (oxaliplatin held secondary to thrombocytopenia) ? Cycle 9 FOLFOX 06/19/2014 with Neulasta support ? Cycle 10 FOLFOX 07/03/2014 (oxaliplatin held secondary to thrombocytopenia) ? Cycle 11 FOLFOX 07/17/2014 ? Cycle 12 FOLFOX 07/31/2014 (oxaliplatin dose reduced due to thrombocytopenia ? CTs chest, abdomen, and pelvis on 01/05/2015-no evidence of recurrent disease ? Colonoscopy 09/15/2015-tubular adenoma removed from the transverse colon ? CTs chest, abdomen, pelvis on 01/14/2016-no evidence of recurrent disease ? CTs 01/12/2017-no evidence of recurrent cancer. ? Colonoscopy 10/25/2017-1 cm deep firm ulceration on the colonic side distal aspect of the anastomosis.  Pathology-adenocarcinoma ? CEA 11/15/2017-2.2 ? CT abdomen/pelvis 11/16/2017- no evidence of recurrent or metastatic carcinoma.  Stable small hiatal hernia and periumbilical ventral hernia.  Stable benign bilateral adrenal lesions. ? Resection of anastomosis 01/11/2018-adenocarcinoma, recurrent, moderately differentiated (2 cm) involving the prior anastomotic site; tumor invades muscularis propria; no carcinoma identified in 7 lymph nodes.  Margins negative. ? CTs 08/14/2018-masslike collection of fluid in the subcutaneous tissue of the umbilicus ? Ultrasound-guided aspiration of anterior abdominal wall complex fluid collection 09/07/2018, negative cytology ? Colonoscopy  10/30/2018-superficial ulceration at the ileocolonic anastomosis, biopsy negative for malignancy  2. History of Iron deficiency anemia 3. Family history of breast cancer 4.  Port-A-Cath placement 02/18/2014 5. Delayed nausea following cycle 1 FOLFOX. Aloxi and Emend added beginning with cycle 2. Prophylactic Decadron added beginning with cycle 3. 6. Neutropenia following cycle 4 FOLFOX. Neulasta was added with cycle 5. 7. Hypokalemia 04/17/2014. Question secondary to diarrhea. Potassium supplement initiated. 8. Left upper arm pain. Negative venous Doppler 03/07/2014. She was referred to orthopedics. 9. Thrombocytopenia secondary to chemotherapy 10. Mild oxaliplatin neuropathy 11. Hypertension  12. 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 Right facial cellulitis 07/15/2015-treated with doxycycline     Disposition: [name] is in clinical remission from colon cancer now almost 1 year out from resection of an anastomotic recurrence.  She has a postoperative seroma.  She plans to see Dr. [name] this month to discuss surgical drainage.  We will follow up on the CEA from today.  [name] will return for an office visit and CEA in 3 months.  [name], MD  11/15/2018  11:53 AM

## 2018-11-15 NOTE — Telephone Encounter (Signed)
TC to pt per Ned Card NP to let her know CEA is normal, follow-up as scheduled. Patient verbalized understanding. No further problems or concerns at this time.

## 2018-11-16 ENCOUNTER — Telehealth: Payer: Self-pay | Admitting: Oncology

## 2018-11-16 NOTE — Telephone Encounter (Signed)
I talk with patient regarding schedule  

## 2018-12-24 ENCOUNTER — Ambulatory Visit: Payer: Self-pay | Admitting: Surgery

## 2019-01-16 ENCOUNTER — Encounter (HOSPITAL_COMMUNITY): Payer: Self-pay | Admitting: Physician Assistant

## 2019-02-12 ENCOUNTER — Other Ambulatory Visit (HOSPITAL_COMMUNITY): Payer: Medicare HMO

## 2019-02-14 ENCOUNTER — Telehealth: Payer: Self-pay | Admitting: Nurse Practitioner

## 2019-02-14 NOTE — Telephone Encounter (Signed)
R/s appt per 1/14 sch message - pt is aware of apt date and time

## 2019-02-15 ENCOUNTER — Other Ambulatory Visit: Payer: Medicare HMO

## 2019-02-15 ENCOUNTER — Ambulatory Visit: Admit: 2019-02-15 | Payer: Medicare HMO | Admitting: Surgery

## 2019-02-15 ENCOUNTER — Ambulatory Visit: Payer: Medicare HMO | Admitting: Nurse Practitioner

## 2019-02-15 SURGERY — INCISION AND DRAINAGE, ABSCESS
Anesthesia: General

## 2019-03-01 ENCOUNTER — Inpatient Hospital Stay: Payer: Medicare HMO | Attending: Oncology

## 2019-03-01 ENCOUNTER — Encounter: Payer: Self-pay | Admitting: Nurse Practitioner

## 2019-03-01 ENCOUNTER — Other Ambulatory Visit: Payer: Self-pay

## 2019-03-01 ENCOUNTER — Inpatient Hospital Stay (HOSPITAL_BASED_OUTPATIENT_CLINIC_OR_DEPARTMENT_OTHER): Payer: Medicare HMO | Admitting: Nurse Practitioner

## 2019-03-01 VITALS — BP 158/72 | HR 68 | Temp 97.8°F | Resp 18 | Ht 63.0 in | Wt 185.1 lb

## 2019-03-01 DIAGNOSIS — C189 Malignant neoplasm of colon, unspecified: Secondary | ICD-10-CM

## 2019-03-01 DIAGNOSIS — I10 Essential (primary) hypertension: Secondary | ICD-10-CM | POA: Insufficient documentation

## 2019-03-01 DIAGNOSIS — Z9221 Personal history of antineoplastic chemotherapy: Secondary | ICD-10-CM | POA: Diagnosis not present

## 2019-03-01 LAB — CEA (IN HOUSE-CHCC): CEA (CHCC-In House): 3.16 ng/mL (ref 0.00–5.00)

## 2019-03-01 NOTE — Progress Notes (Signed)
[facility] OFFICE PROGRESS NOTE   Diagnosis:  Colon cancer   INTERVAL HISTORY:   Gail Carlson returns as scheduled.  She feels well.  No abdominal pain.  Bowels are moving.  She has occasional nausea which she thinks is diet related.  No rectal bleeding.  Objective:  Vital signs in last 24 hours:  Blood pressure (!) 158/72, pulse 68, temperature 97.8 F (36.6 C), temperature source Temporal, resp. rate 18, height '5\' 3"'  (1.6 m), weight 185 lb 1.6 oz (84 kg), SpO2 100 %.    HEENT: Neck without mass. Lymphatics: No palpable cervical, supraclavicular, axillary or inguinal lymph nodes. Resp: Lungs clear bilaterally. Cardio: Regular rate and rhythm. GI: Abdomen soft and nontender.  No hepatomegaly.  Masslike fullness anterior mid abdomen. Vascular: No leg edema.   Lab Results:  Lab Results  Component Value Date   WBC 6.7 09/07/2018   HGB 12.0 09/07/2018   HCT 38.7 09/07/2018   MCV 86.4 09/07/2018   PLT 308 09/07/2018   NEUTROABS 3.3 11/15/2017    Imaging:  No results found.  Medications: I have reviewed the patient's current medications.  Assessment/Plan: 1. 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 ? CTs of the chest, abdomen, and pelvis on 01/10/2014 with a cecal mass, nonspecific small liver lesions, and nonspecific lung nodules felt to most likely represent mucoid impaction ? Cycle 1 FOLFOX 02/20/2014 ? Cycle 2 FOLFOX 03/06/2014 ? Cycle 3 FOLFOX 03/20/2014 ? Cycle 4 FOLFOX 04/03/2014 ? Cycle 5 FOLFOX held on 04/17/2014 due to neutropenia ? Cycle 5 FOLFOX 04/24/2014 with Neulasta support ? Cycle 6 FOLFOX 05/08/2014 ? Cycle 7 FOLFOX 05/22/2014 with Neulasta support ? Cycle 8 FOLFOX 06/05/2014 (oxaliplatin held secondary to thrombocytopenia) ? Cycle 9 FOLFOX 06/19/2014 with Neulasta  support ? Cycle 10 FOLFOX 07/03/2014 (oxaliplatin held secondary to thrombocytopenia) ? Cycle 11 FOLFOX 07/17/2014 ? Cycle 12 FOLFOX 07/31/2014 (oxaliplatin dose reduced due to thrombocytopenia ? CTs chest, abdomen, and pelvis on 01/05/2015-no evidence of recurrent disease ? Colonoscopy 09/15/2015-tubular adenoma removed from the transverse colon ? CTs chest, abdomen, pelvis on 01/14/2016-no evidence of recurrent disease ? CTs 01/12/2017-no evidence of recurrent cancer. ? Colonoscopy 10/25/2017-1 cm deep firm ulceration on the colonic side distal aspect of the anastomosis. Pathology-adenocarcinoma ? CEA 11/15/2017-2.2 ? CT abdomen/pelvis 11/16/2017-no evidence of recurrent or metastatic carcinoma. Stable small hiatal hernia and periumbilical ventral hernia. Stable benign bilateral adrenal lesions. ? Resection of anastomosis 01/11/2018-adenocarcinoma, recurrent, moderately differentiated(2 cm) involving the prior anastomotic site; tumor invades muscularis propria; no carcinoma identified in 7 lymph nodes. Margins negative. ? CTs 08/14/2018-masslike collection of fluid in the subcutaneous tissue of the umbilicus ? Ultrasound-guided aspiration of anterior abdominal wall complex fluid collection 09/07/2018, negative cytology ? Colonoscopy 10/30/2018-superficial ulceration at the ileocolonic anastomosis, biopsy negative for malignancy  2. History of Iron deficiency anemia 3. Family history of breast cancer 4. Port-A-Cath placement 02/18/2014 5. Delayed nausea following cycle 1 FOLFOX. Aloxi and Emend added beginning with cycle 2. Prophylactic Decadron added beginning with cycle 3. 6. Neutropenia following cycle 4 FOLFOX. Neulasta was added with cycle 5. 7. Hypokalemia 04/17/2014. Question secondary to diarrhea. Potassium supplement initiated. 8. Left upper arm pain. Negative venous Doppler 03/07/2014. She was referred to orthopedics. 9. Thrombocytopenia secondary to chemotherapy 10. Mild  oxaliplatin neuropathy 11. Hypertension  12. 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 Right facial cellulitis 07/15/2015-treated with doxycycline   Disposition: Gail Carlson remains in clinical remission from colon cancer.  We will follow-up on the CEA from today.  She will undergo CT scans prior to her next visit.  She continues follow-up with Dr. [name] regarding the postoperative seroma.  She will return for labs and CT scans in 6 months.  We will see her a few days after that to review the results.  She will contact the office in the interim with any problems.  Plan reviewed with Dr. Benay Spice.   Gail Carlson ANP/GNP-BC   03/01/2019  10:37 AM

## 2019-03-29 ENCOUNTER — Ambulatory Visit: Payer: Medicare HMO | Attending: Internal Medicine

## 2019-03-29 DIAGNOSIS — Z23 Encounter for immunization: Secondary | ICD-10-CM | POA: Insufficient documentation

## 2019-03-29 NOTE — Progress Notes (Signed)
   Covid-19 Vaccination Clinic  Name:  Gail Carlson    MRN: [id number] DOB: 11/07/1950  03/29/2019  [name] was observed post Covid-19 immunization for 15 minutes without incidence. She was provided with Vaccine Information Sheet and instruction to access the V-Safe system.   [name] was instructed to call 911 with any severe reactions post vaccine: Marland Kitchen Difficulty breathing  . Swelling of your face and throat  . A fast heartbeat  . A bad rash all over your body  . Dizziness and weakness    Immunizations Administered    Name Date Dose VIS Date Route   Pfizer COVID-19 Vaccine 03/29/2019  9:33 AM 0.3 mL 01/11/2019 Intramuscular   Manufacturer: [place]   Lot: [id number]   [place]: [id number]

## 2019-04-23 ENCOUNTER — Ambulatory Visit: Payer: Medicare HMO | Attending: Internal Medicine

## 2019-04-23 DIAGNOSIS — Z23 Encounter for immunization: Secondary | ICD-10-CM

## 2019-04-23 NOTE — Progress Notes (Signed)
   Covid-19 Vaccination Clinic  Name:  SORAIDA CHANLEY    MRN: [id number] DOB: Apr 10, 1950  04/23/2019  Ms. Moccia was observed post Covid-19 immunization for 15 minutes without incident. She was provided with Vaccine Information Sheet and instruction to access the V-Safe system.   Ms. Cisar was instructed to call 911 with any severe reactions post vaccine: Marland Kitchen Difficulty breathing  . Swelling of face and throat  . A fast heartbeat  . A bad rash all over body  . Dizziness and weakness   Immunizations Administered    Name Date Dose VIS Date Route   Pfizer COVID-19 Vaccine 04/23/2019  1:28 PM 0.3 mL 01/11/2019 Intramuscular   Manufacturer: [place]   Lot: [id number]   [place]: [id number]

## 2019-04-26 NOTE — Patient Instructions (Addendum)
DUE TO COVID-19 ONLY ONE VISITOR IS ALLOWED TO COME WITH YOU AND STAY IN THE WAITING ROOM ONLY DURING PRE OP AND PROCEDURE DAY OF SURGERY. THE 1 VISITOR MAY VISIT WITH YOU AFTER SURGERY IN YOUR PRIVATE ROOM DURING VISITING HOURS ONLY!  10 am -8pm  YOU NEED TO HAVE A COVID 19 TEST ON__4-5-21_____ @__10 :00 am_____, THIS TEST MUST BE DONE BEFORE SURGERY, COME  [address], [place] , [address].  ([place]) ONCE YOUR COVID TEST IS COMPLETED, PLEASE BEGIN THE QUARANTINE INSTRUCTIONS AS OUTLINED IN YOUR HANDOUT.                Gail Carlson  04/26/2019   Your procedure is scheduled on: 05-09-19   Report to [facility] Main  Entrance   Report to admitting at       [place] AM     Call this number if you have problems the morning of surgery  [phone]    Remember: Do not eat food or drink liquids :After Midnight.   BRUSH YOUR TEETH MORNING OF SURGERY AND RINSE YOUR MOUTH OUT, NO CHEWING GUM CANDY OR MINTS.     Take these medicines the morning of surgery with A SIP OF WATER: amlodipine                                 You may not have any metal on your body including hair pins and              piercings  Do not wear jewelry, make-up, lotions, powders or perfumes, deodorant             Do not wear nail polish on your fingernails.  Do not shave  48 hours prior to surgery.                Do not bring valuables to the hospital. [place].  Contacts, dentures or bridgework may not be worn into surgery.  Leave suitcase in the car. After surgery it may be brought to your room.     Patients discharged the day of surgery will not be allowed to drive home. IF YOU ARE HAVING SURGERY AND GOING HOME THE SAME DAY, YOU MUST HAVE AN ADULT TO DRIVE YOU HOME AND BE WITH YOU FOR 24 HOURS. YOU MAY GO HOME BY TAXI OR UBER OR ORTHERWISE, BUT AN ADULT MUST ACCOMPANY YOU HOME AND STAY WITH YOU FOR 24 HOURS.  Name and phone number  of your driver:  Special Instructions: N/A              Please read over the following fact sheets you were given: _____________________________________________________________________             [facility] - Preparing for Surgery Before surgery, you can play an important role.  Because skin is not sterile, your skin needs to be as free of germs as possible.  You can reduce the number of germs on your skin by washing with CHG (chlorahexidine gluconate) soap before surgery.  CHG is an antiseptic cleaner which kills germs and bonds with the skin to continue killing germs even after washing. Please DO NOT use if you have an allergy to CHG or antibacterial soaps.  If your skin becomes reddened/irritated stop using the CHG and inform your  nurse when you arrive at Short Stay. Do not shave (including legs and underarms) for at least 48 hours prior to the first CHG shower.  You may shave your face/neck. Please follow these instructions carefully:  1.  Shower with CHG Soap the night before surgery and the  morning of Surgery.  2.  If you choose to wash your hair, wash your hair first as usual with your  normal  shampoo.  3.  After you shampoo, rinse your hair and body thoroughly to remove the  shampoo.                           4.  Use CHG as you would any other liquid soap.  You can apply chg directly  to the skin and wash                       Gently with a scrungie or clean washcloth.  5.  Apply the CHG Soap to your body ONLY FROM THE NECK DOWN.   Do not use on face/ open                           Wound or open sores. Avoid contact with eyes, ears mouth and genitals (private parts).                       Wash face,  Genitals (private parts) with your normal soap.             6.  Wash thoroughly, paying special attention to the area where your surgery  will be performed.  7.  Thoroughly rinse your body with warm water from the neck down.  8.  DO NOT shower/wash with your normal soap after using and  rinsing off  the CHG Soap.                9.  Pat yourself dry with a clean towel.            10.  Wear clean pajamas.            11.  Place clean sheets on your bed the night of your first shower and do not  sleep with pets. Day of Surgery : Do not apply any lotions/deodorants the morning of surgery.  Please wear clean clothes to the hospital/surgery center.  FAILURE TO FOLLOW THESE INSTRUCTIONS MAY RESULT IN THE CANCELLATION OF YOUR SURGERY PATIENT SIGNATURE_________________________________  NURSE SIGNATURE__________________________________  ________________________________________________________________________

## 2019-04-26 NOTE — Progress Notes (Signed)
PCP - Nolene Ebbs Cardiologist -   Chest x-ray -  CT chest 08-14-18 epic EKG - 04-30-19 epic Stress Test -  ECHO -  Cardiac Cath -   Sleep Study -  CPAP -   Fasting Blood Sugar -  Checks Blood Sugar _____ times a day  Blood Thinner Instructions: Aspirin Instructions: Last Dose:  Anesthesia review: abnormal ekg  Patient denies shortness of breath, fever, cough and chest pain at PAT appointment   none   Patient verbalized understanding of instructions that were given to them at the PAT appointment. Patient was also instructed that they will need to review over the PAT instructions again at home before surgery.

## 2019-04-29 ENCOUNTER — Encounter (HOSPITAL_COMMUNITY)
Admission: RE | Admit: 2019-04-29 | Discharge: 2019-04-29 | Disposition: A | Discharge status: Home / Self Care | Payer: Medicare HMO | Source: Ambulatory Visit | Attending: Surgery | Admitting: Surgery

## 2019-04-29 ENCOUNTER — Other Ambulatory Visit: Payer: Self-pay

## 2019-04-29 ENCOUNTER — Ambulatory Visit: Payer: Self-pay | Admitting: Surgery

## 2019-04-29 ENCOUNTER — Encounter (HOSPITAL_COMMUNITY): Payer: Self-pay

## 2019-04-29 DIAGNOSIS — Z01818 Encounter for other preprocedural examination: Secondary | ICD-10-CM | POA: Diagnosis present

## 2019-04-29 HISTORY — DX: Insomnia, unspecified: G47.00

## 2019-04-30 ENCOUNTER — Encounter (HOSPITAL_COMMUNITY)
Admission: RE | Admit: 2019-04-30 | Discharge: 2019-04-30 | Disposition: A | Discharge status: Home / Self Care | Payer: Medicare HMO | Source: Ambulatory Visit | Attending: Surgery | Admitting: Surgery

## 2019-04-30 DIAGNOSIS — Z01818 Encounter for other preprocedural examination: Secondary | ICD-10-CM | POA: Diagnosis not present

## 2019-04-30 LAB — CBC
HCT: 41.5 % (ref 36.0–46.0)
Hemoglobin: 12.5 g/dL (ref 12.0–15.0)
MCH: 27 pg (ref 26.0–34.0)
MCHC: 30.1 g/dL (ref 30.0–36.0)
MCV: 89.6 fL (ref 80.0–100.0)
Platelets: 269 10*3/uL (ref 150–400)
RBC: 4.63 MIL/uL (ref 3.87–5.11)
RDW: 15.1 % (ref 11.5–15.5)
WBC: 6.1 10*3/uL (ref 4.0–10.5)
nRBC: 0 % (ref 0.0–0.2)

## 2019-04-30 LAB — BASIC METABOLIC PANEL
Anion gap: 8 (ref 5–15)
BUN: 20 mg/dL (ref 8–23)
CO2: 26 mmol/L (ref 22–32)
Calcium: 9.1 mg/dL (ref 8.9–10.3)
Chloride: 106 mmol/L (ref 98–111)
Creatinine, Ser: 1.45 mg/dL — ABNORMAL HIGH (ref 0.44–1.00)
GFR calc Af Amer: 42 mL/min — ABNORMAL LOW (ref >60)
GFR calc non Af Amer: 36 mL/min — ABNORMAL LOW (ref >60)
Glucose, Bld: 92 mg/dL (ref 70–99)
Potassium: 4.3 mmol/L (ref 3.5–5.1)
Sodium: 140 mmol/L (ref 135–145)

## 2019-05-05 ENCOUNTER — Encounter (HOSPITAL_COMMUNITY): Payer: Self-pay | Admitting: Surgery

## 2019-05-05 DIAGNOSIS — S301XXA Contusion of abdominal wall, initial encounter: Secondary | ICD-10-CM | POA: Diagnosis present

## 2019-05-05 DIAGNOSIS — Z8719 Personal history of other diseases of the digestive system: Secondary | ICD-10-CM

## 2019-05-05 DIAGNOSIS — Z85038 Personal history of other malignant neoplasm of large intestine: Secondary | ICD-10-CM | POA: Diagnosis present

## 2019-05-05 NOTE — H&P (Signed)
General Surgery [facility]   Gail Carlson Neither DOB: 27-Dec-1950 Widowed / Language: English / Race: Black or African American Female   History of Present Illness  The patient is a 69 year old female who presents with an abdominal mass.  CHIEF COMPLAINT: abdominal wall seroma, chronic  Patient returns for follow-up of a large anterior abdominal wall seroma which is now become chronic. Patient has had multiple surgical procedures including her colon resections and repair of hernia. After her last procedure, she developed a significant seroma or hematoma which never fully resolved. We have attempted aspiration with interventional radiology but the collection recurred rapidly. Patient would now like to consider undergoing surgery for evacuation of the seroma, removal of the lining, and placement of a subcutaneous drain in hopes of resolving the chronic seroma. The seroma is fairly large in volume and anteriorly situated to the point that it causes discomfort and problems with wearing her clothing. There has been no sign of recurrent hernia. Patient presents today to discuss surgical intervention and timing of surgery.   Problem List/Past Medical  ENCOUNTER FOR REMOVAL OF STAPLES (Z48.02)  STATUS POST COLON RESECTION (Z90.49)  INCISIONAL HERNIA, WITHOUT OBSTRUCTION OR GANGRENE (K43.2)  PRIMARY ADENOCARCINOMA OF COLON (C18.9)  HISTORY OF INCISIONAL HERNIA REPAIR (Z98.890)  POSTPROCEDURAL SEROMA OF A MUSCULOSKELETAL STRUCTURE FOLLOWING OTHER PROCEDURE (M96.843)  ABDOMINAL WALL HEMATOMA, SUBSEQUENT ENCOUNTER (S30.1XXD)  HISTORY OF COLON CANCER (Z85.038)   Past Surgical History Colon Polyp Removal - Colonoscopy   Diagnostic Studies History  Colonoscopy  within last year Mammogram  within last year Pap Smear  1-5 years ago  Allergies  No Known Drug Allergies   Medication History  Tylenol (325MG  Tablet, 1 (one) Oral)  Active. Lisinopril-Hydrochlorothiazide (20-12.5MG  Tablet, Oral) Active. Prochlorperazine Maleate (10MG  Tablet, Oral) Active. AmLODIPine Besylate (10MG  Tablet, Oral) Active. Citalopram Hydrobromide (10MG  Tablet, Oral) Active. Meloxicam (15MG  Tablet, Oral) Active.  Social History  Alcohol use  Occasional alcohol use. Caffeine use  Coffee. Tobacco use  Former smoker.  Family History  Arthritis  Mother. Breast Cancer  Mother. Depression  Brother. Diabetes Mellitus  Brother, Mother. Heart Disease  Mother. Hypertension  Mother.  Pregnancy / Birth History  Age at menarche  66 years. Age of menopause  <43 Gravida  2 Maternal age  81-20 Para  2  Other Problems  Anxiety Disorder  Arthritis  Back Pain  Colon Cancer  Depression  Gastric Ulcer  Hemorrhoids  High blood pressure   Vitals  Weight: 181.4 lb Height: 63in Body Surface Area: 1.86 m Body Mass Index: 32.13 kg/m  Temp.: 98.76F  Pulse: 89 (Regular)  BP: 145/90 (Sitting, Left Arm, Standard)  Physical Exam  GENERAL APPEARANCE Development: normal Nutritional status: normal Gross deformities: none  SKIN Rash, lesions, ulcers: none Induration, erythema: none Nodules: none palpable  EYES Conjunctiva and lids: normal Pupils: equal and reactive Iris: normal bilaterally  EARS, NOSE, MOUTH, THROAT External ears: no lesion or deformity External nose: no lesion or deformity Hearing: grossly normal Patient is wearing a mask.  NECK Symmetric: yes Trachea: midline Thyroid: no palpable nodules in the thyroid bed  CHEST Respiratory effort: normal Retraction or accessory muscle use: no Breath sounds: normal bilaterally Rales, rhonchi, wheeze: none  CARDIOVASCULAR Auscultation: regular rhythm, normal rate Murmurs: none Pulses: carotid and radial pulse 2+ palpable Lower extremity edema: none Lower extremity varicosities: none  ABDOMEN Distension: yes due to  seroma Masses: none palpable Tenderness: none Hepatosplenomegaly: not present Hernia: not present; large chronic seroma at and  above the level of the umbilicus  MUSCULOSKELETAL Station and gait: normal Digits and nails: no clubbing or cyanosis Muscle strength: grossly normal all extremities Range of motion: grossly normal all extremities Deformity: none  LYMPHATIC Cervical: none palpable Supraclavicular: none palpable  PSYCHIATRIC Oriented to person, place, and time: yes Mood and affect: normal for situation Judgment and insight: appropriate for situation    Assessment & Plan  HISTORY OF COLON CANCER (Z85.038) HISTORY OF INCISIONAL HERNIA REPAIR (Z98.890) POSTPROCEDURAL SEROMA OF A MUSCULOSKELETAL STRUCTURE FOLLOWING OTHER PROCEDURE [id number])  Patient returns to discuss surgery for chronic seroma involving the anterior abdominal wall as a consequence of her multiple surgical procedures. Attempts at management with aspiration and drainage have failed. Patient would like to proceed with operative treatment.  I have recommended open operative evacuation of the chronic seroma on the anterior abdominal wall. We will plan to remove the lining from the seroma cavity. We will then close the wound in layers over percutaneous drains. These may need to remain in place for 1-2 weeks following the procedure.  Patient understands the recommended procedure. We have discussed her postoperative recovery. We will plan for her surgery after the first of the year.  The risks and benefits of the procedure have been discussed at length with the patient. The patient understands the proposed procedure, potential alternative treatments, and the course of recovery to be expected. All of the patient's questions have been answered at this time. The patient wishes to proceed with surgery.  Armandina Gemma, MD [facility] Office: [phone]

## 2019-05-06 ENCOUNTER — Other Ambulatory Visit (HOSPITAL_COMMUNITY)
Admission: RE | Admit: 2019-05-06 | Discharge: 2019-05-06 | Disposition: A | Discharge status: Home / Self Care | Payer: Medicare Other | Source: Ambulatory Visit | Attending: Surgery | Admitting: Surgery

## 2019-05-06 DIAGNOSIS — Z01812 Encounter for preprocedural laboratory examination: Secondary | ICD-10-CM | POA: Insufficient documentation

## 2019-05-06 DIAGNOSIS — Z20822 Contact with and (suspected) exposure to covid-19: Secondary | ICD-10-CM | POA: Insufficient documentation

## 2019-05-06 LAB — SARS CORONAVIRUS 2 (TAT 6-24 HRS): SARS Coronavirus 2: NEGATIVE

## 2019-05-08 NOTE — Anesthesia Preprocedure Evaluation (Addendum)
Anesthesia Evaluation  Patient identified by MRN, date of birth, ID band Patient awake    Reviewed: Allergy & Precautions, NPO status , Patient's Chart, lab work & pertinent test results  History of Anesthesia Complications Negative for: history of anesthetic complications  Airway Mallampati: II  TM Distance: >3 FB Neck ROM: Full    Dental  (+) Lower Dentures, Dental Advisory Given   Pulmonary Current Smoker and Patient abstained from smoking.,    Pulmonary exam normal        Cardiovascular hypertension, Pt. on medications Normal cardiovascular exam     Neuro/Psych Anxiety Depression negative neurological ROS     GI/Hepatic Neg liver ROS, GERD  Controlled,Colon CA   Endo/Other  negative endocrine ROS  Renal/GU negative Renal ROS     Musculoskeletal  (+) Arthritis ,   Abdominal   Peds  Hematology negative hematology ROS (+)   Anesthesia Other Findings   Reproductive/Obstetrics                            Lab Results  Component Value Date   WBC 6.1 04/30/2019   HGB 12.5 04/30/2019   HCT 41.5 04/30/2019   MCV 89.6 04/30/2019   PLT 269 04/30/2019   Lab Results  Component Value Date   CREATININE 1.45 (H) 04/30/2019   BUN 20 04/30/2019   NA 140 04/30/2019   K 4.3 04/30/2019   CL 106 04/30/2019   CO2 26 04/30/2019    Anesthesia Physical  Anesthesia Plan  ASA: III  Anesthesia Plan: General   Post-op Pain Management:    Induction: Intravenous  PONV Risk Score and Plan: 3 and Ondansetron, Dexamethasone, Treatment may vary due to age or medical condition and Midazolam  Airway Management Planned: Oral ETT and LMA  Additional Equipment:   Intra-op Plan:   Post-operative Plan: Extubation in OR  Informed Consent: I have reviewed the patients History and Physical, chart, labs and discussed the procedure including the risks, benefits and alternatives for the proposed  anesthesia with the patient or authorized representative who has indicated his/her understanding and acceptance.     Dental advisory given  Plan Discussed with: CRNA and Anesthesiologist  Anesthesia Plan Comments:        Anesthesia Quick Evaluation

## 2019-05-09 ENCOUNTER — Encounter (HOSPITAL_COMMUNITY)
Admission: RE | Disposition: A | Discharge status: Home / Self Care | Payer: Self-pay | Source: Home / Self Care | Attending: Surgery

## 2019-05-09 ENCOUNTER — Ambulatory Visit (HOSPITAL_COMMUNITY)
Admission: RE | Admit: 2019-05-09 | Discharge: 2019-05-10 | Disposition: A | Discharge status: Home / Self Care | Payer: Medicare Other | Attending: Surgery | Admitting: Surgery

## 2019-05-09 ENCOUNTER — Ambulatory Visit (HOSPITAL_COMMUNITY): Payer: Medicare Other | Admitting: Certified Registered Nurse Anesthetist

## 2019-05-09 ENCOUNTER — Ambulatory Visit (HOSPITAL_COMMUNITY): Payer: Medicare Other | Admitting: Physician Assistant

## 2019-05-09 ENCOUNTER — Other Ambulatory Visit: Payer: Self-pay

## 2019-05-09 ENCOUNTER — Encounter (HOSPITAL_COMMUNITY): Payer: Self-pay | Admitting: Surgery

## 2019-05-09 DIAGNOSIS — Z833 Family history of diabetes mellitus: Secondary | ICD-10-CM | POA: Insufficient documentation

## 2019-05-09 DIAGNOSIS — Z8719 Personal history of other diseases of the digestive system: Secondary | ICD-10-CM | POA: Diagnosis not present

## 2019-05-09 DIAGNOSIS — Z8261 Family history of arthritis: Secondary | ICD-10-CM | POA: Insufficient documentation

## 2019-05-09 DIAGNOSIS — Z809 Family history of malignant neoplasm, unspecified: Secondary | ICD-10-CM | POA: Diagnosis not present

## 2019-05-09 DIAGNOSIS — Z86010 Personal history of colonic polyps: Secondary | ICD-10-CM | POA: Diagnosis not present

## 2019-05-09 DIAGNOSIS — Z79899 Other long term (current) drug therapy: Secondary | ICD-10-CM | POA: Diagnosis not present

## 2019-05-09 DIAGNOSIS — Z818 Family history of other mental and behavioral disorders: Secondary | ICD-10-CM | POA: Insufficient documentation

## 2019-05-09 DIAGNOSIS — Z8249 Family history of ischemic heart disease and other diseases of the circulatory system: Secondary | ICD-10-CM | POA: Diagnosis not present

## 2019-05-09 DIAGNOSIS — Z9889 Other specified postprocedural states: Secondary | ICD-10-CM

## 2019-05-09 DIAGNOSIS — I10 Essential (primary) hypertension: Secondary | ICD-10-CM | POA: Diagnosis not present

## 2019-05-09 DIAGNOSIS — F419 Anxiety disorder, unspecified: Secondary | ICD-10-CM | POA: Diagnosis not present

## 2019-05-09 DIAGNOSIS — S301XXA Contusion of abdominal wall, initial encounter: Secondary | ICD-10-CM | POA: Diagnosis present

## 2019-05-09 DIAGNOSIS — K219 Gastro-esophageal reflux disease without esophagitis: Secondary | ICD-10-CM | POA: Diagnosis not present

## 2019-05-09 DIAGNOSIS — Z85038 Personal history of other malignant neoplasm of large intestine: Secondary | ICD-10-CM | POA: Diagnosis present

## 2019-05-09 DIAGNOSIS — Y838 Other surgical procedures as the cause of abnormal reaction of the patient, or of later complication, without mention of misadventure at the time of the procedure: Secondary | ICD-10-CM | POA: Insufficient documentation

## 2019-05-09 DIAGNOSIS — Y828 Other medical devices associated with adverse incidents: Secondary | ICD-10-CM | POA: Insufficient documentation

## 2019-05-09 DIAGNOSIS — Z803 Family history of malignant neoplasm of breast: Secondary | ICD-10-CM | POA: Diagnosis not present

## 2019-05-09 DIAGNOSIS — M1990 Unspecified osteoarthritis, unspecified site: Secondary | ICD-10-CM | POA: Insufficient documentation

## 2019-05-09 DIAGNOSIS — Z791 Long term (current) use of non-steroidal anti-inflammatories (NSAID): Secondary | ICD-10-CM | POA: Diagnosis not present

## 2019-05-09 DIAGNOSIS — Z87891 Personal history of nicotine dependence: Secondary | ICD-10-CM | POA: Insufficient documentation

## 2019-05-09 DIAGNOSIS — L7634 Postprocedural seroma of skin and subcutaneous tissue following other procedure: Secondary | ICD-10-CM | POA: Diagnosis not present

## 2019-05-09 DIAGNOSIS — F329 Major depressive disorder, single episode, unspecified: Secondary | ICD-10-CM | POA: Diagnosis not present

## 2019-05-09 HISTORY — PX: INCISION AND DRAINAGE ABSCESS: SHX5864

## 2019-05-09 SURGERY — INCISION AND DRAINAGE, ABSCESS
Anesthesia: General

## 2019-05-09 MED ORDER — OXYCODONE HCL 5 MG PO TABS
5.0000 mg | ORAL_TABLET | ORAL | Status: DC | PRN
  Administered 2019-05-09: 5 mg via ORAL
  Administered 2019-05-10: 10 mg via ORAL
  Administered 2019-05-10: 5 mg via ORAL
  Filled 2019-05-09: qty 2
  Filled 2019-05-09 (×2): qty 1

## 2019-05-09 MED ORDER — ACETAMINOPHEN 325 MG PO TABS
650.0000 mg | ORAL_TABLET | Freq: Four times a day (QID) | ORAL | Status: DC | PRN
  Administered 2019-05-10: 650 mg via ORAL
  Filled 2019-05-09: qty 2

## 2019-05-09 MED ORDER — TRAMADOL HCL 50 MG PO TABS: 50.0000 mg | ORAL_TABLET | Freq: Four times a day (QID) | ORAL | Status: DC | PRN

## 2019-05-09 MED ORDER — ONDANSETRON 4 MG PO TBDP: 4.0000 mg | ORAL_TABLET | Freq: Four times a day (QID) | ORAL | Status: DC | PRN

## 2019-05-09 MED ORDER — FENTANYL CITRATE (PF) 100 MCG/2ML IJ SOLN
25.0000 ug | INTRAMUSCULAR | Status: DC | PRN
  Administered 2019-05-09 (×3): 50 ug via INTRAVENOUS

## 2019-05-09 MED ORDER — MIDAZOLAM HCL 2 MG/2ML IJ SOLN
INTRAMUSCULAR | Status: AC
  Filled 2019-05-09: qty 2

## 2019-05-09 MED ORDER — ALUM & MAG HYDROXIDE-SIMETH 200-200-20 MG/5ML PO SUSP
30.0000 mL | Freq: Four times a day (QID) | ORAL | Status: DC | PRN
  Administered 2019-05-09: 30 mL via ORAL
  Filled 2019-05-09: qty 30

## 2019-05-09 MED ORDER — LIDOCAINE 2% (20 MG/ML) 5 ML SYRINGE
INTRAMUSCULAR | Status: AC
  Filled 2019-05-09: qty 5

## 2019-05-09 MED ORDER — LISINOPRIL 20 MG PO TABS
20.0000 mg | ORAL_TABLET | Freq: Every evening | ORAL | Status: DC
  Administered 2019-05-09: 20 mg via ORAL
  Filled 2019-05-09: qty 1

## 2019-05-09 MED ORDER — CELECOXIB 200 MG PO CAPS
200.0000 mg | ORAL_CAPSULE | Freq: Once | ORAL | Status: AC
  Administered 2019-05-09: 200 mg via ORAL
  Filled 2019-05-09: qty 1

## 2019-05-09 MED ORDER — DEXAMETHASONE SODIUM PHOSPHATE 10 MG/ML IJ SOLN
INTRAMUSCULAR | Status: AC
  Filled 2019-05-09: qty 1

## 2019-05-09 MED ORDER — CITALOPRAM HYDROBROMIDE 20 MG PO TABS
10.0000 mg | ORAL_TABLET | Freq: Every evening | ORAL | Status: DC
  Administered 2019-05-09: 10 mg via ORAL
  Filled 2019-05-09: qty 1

## 2019-05-09 MED ORDER — MELOXICAM 15 MG PO TABS
15.0000 mg | ORAL_TABLET | Freq: Every day | ORAL | Status: DC
  Administered 2019-05-09 – 2019-05-10 (×2): 15 mg via ORAL
  Filled 2019-05-09 (×2): qty 1

## 2019-05-09 MED ORDER — FENTANYL CITRATE (PF) 250 MCG/5ML IJ SOLN
INTRAMUSCULAR | Status: AC
  Filled 2019-05-09: qty 5

## 2019-05-09 MED ORDER — LIDOCAINE 2% (20 MG/ML) 5 ML SYRINGE
INTRAMUSCULAR | Status: DC | PRN
  Administered 2019-05-09: 60 mg via INTRAVENOUS

## 2019-05-09 MED ORDER — MIDAZOLAM HCL 5 MG/5ML IJ SOLN
INTRAMUSCULAR | Status: DC | PRN
  Administered 2019-05-09 (×2): 1 mg via INTRAVENOUS

## 2019-05-09 MED ORDER — PROPOFOL 10 MG/ML IV BOLUS
INTRAVENOUS | Status: DC | PRN
  Administered 2019-05-09: 130 mg via INTRAVENOUS

## 2019-05-09 MED ORDER — ONDANSETRON HCL 4 MG/2ML IJ SOLN: 4.0000 mg | Freq: Four times a day (QID) | INTRAMUSCULAR | Status: DC | PRN

## 2019-05-09 MED ORDER — PROPOFOL 10 MG/ML IV BOLUS
INTRAVENOUS | Status: AC
  Filled 2019-05-09: qty 20

## 2019-05-09 MED ORDER — FENTANYL CITRATE (PF) 100 MCG/2ML IJ SOLN
INTRAMUSCULAR | Status: AC
  Filled 2019-05-09: qty 2

## 2019-05-09 MED ORDER — CHLORHEXIDINE GLUCONATE CLOTH 2 % EX PADS: 6.0000 | MEDICATED_PAD | Freq: Once | CUTANEOUS | Status: DC

## 2019-05-09 MED ORDER — CEFAZOLIN SODIUM-DEXTROSE 2-4 GM/100ML-% IV SOLN
2.0000 g | INTRAVENOUS | Status: AC
  Administered 2019-05-09: 2 g via INTRAVENOUS
  Filled 2019-05-09: qty 100

## 2019-05-09 MED ORDER — HYDROMORPHONE HCL 1 MG/ML IJ SOLN: 1.0000 mg | INTRAMUSCULAR | Status: DC | PRN

## 2019-05-09 MED ORDER — ACETAMINOPHEN 650 MG RE SUPP: 650.0000 mg | Freq: Four times a day (QID) | RECTAL | Status: DC | PRN

## 2019-05-09 MED ORDER — AMLODIPINE BESYLATE 10 MG PO TABS
10.0000 mg | ORAL_TABLET | Freq: Every day | ORAL | Status: DC
  Administered 2019-05-10: 10 mg via ORAL
  Filled 2019-05-09: qty 1

## 2019-05-09 MED ORDER — LIDOCAINE HCL 2 % IJ SOLN
INTRAMUSCULAR | Status: AC
  Filled 2019-05-09: qty 20

## 2019-05-09 MED ORDER — PROMETHAZINE HCL 25 MG/ML IJ SOLN: 6.2500 mg | INTRAMUSCULAR | Status: DC | PRN

## 2019-05-09 MED ORDER — HYDROCHLOROTHIAZIDE 12.5 MG PO CAPS: 12.5000 mg | ORAL_CAPSULE | Freq: Every evening | ORAL | Status: DC

## 2019-05-09 MED ORDER — ONDANSETRON HCL 4 MG/2ML IJ SOLN
INTRAMUSCULAR | Status: DC | PRN
  Administered 2019-05-09: 4 mg via INTRAVENOUS

## 2019-05-09 MED ORDER — FENTANYL CITRATE (PF) 100 MCG/2ML IJ SOLN
INTRAMUSCULAR | Status: DC | PRN
  Administered 2019-05-09 (×3): 50 ug via INTRAVENOUS

## 2019-05-09 MED ORDER — DEXAMETHASONE SODIUM PHOSPHATE 10 MG/ML IJ SOLN
INTRAMUSCULAR | Status: DC | PRN
  Administered 2019-05-09: 5 mg via INTRAVENOUS

## 2019-05-09 MED ORDER — EPHEDRINE SULFATE-NACL 50-0.9 MG/10ML-% IV SOSY
PREFILLED_SYRINGE | INTRAVENOUS | Status: DC | PRN
  Administered 2019-05-09: 5 mg via INTRAVENOUS
  Administered 2019-05-09: 10 mg via INTRAVENOUS

## 2019-05-09 MED ORDER — ACETAMINOPHEN 500 MG PO TABS
1000.0000 mg | ORAL_TABLET | Freq: Once | ORAL | Status: AC
  Administered 2019-05-09: 1000 mg via ORAL
  Filled 2019-05-09: qty 2

## 2019-05-09 MED ORDER — LISINOPRIL-HYDROCHLOROTHIAZIDE 20-12.5 MG PO TABS: 1.0000 | ORAL_TABLET | Freq: Every evening | ORAL | Status: DC

## 2019-05-09 MED ORDER — ONDANSETRON HCL 4 MG/2ML IJ SOLN
INTRAMUSCULAR | Status: AC
  Filled 2019-05-09: qty 2

## 2019-05-09 MED ORDER — EPHEDRINE 5 MG/ML INJ
INTRAVENOUS | Status: AC
  Filled 2019-05-09: qty 10

## 2019-05-09 MED ORDER — KCL IN DEXTROSE-NACL 20-5-0.45 MEQ/L-%-% IV SOLN
INTRAVENOUS | Status: DC
  Filled 2019-05-09: qty 1000

## 2019-05-09 MED ORDER — DIPHENHYDRAMINE HCL 25 MG PO CAPS
25.0000 mg | ORAL_CAPSULE | Freq: Once | ORAL | Status: AC
  Administered 2019-05-10: 25 mg via ORAL
  Filled 2019-05-09: qty 1

## 2019-05-09 MED ORDER — LACTATED RINGERS IV SOLN: INTRAVENOUS | Status: DC

## 2019-05-09 SURGICAL SUPPLY — 30 items
APL PRP STRL LF DISP 70% ISPRP (MISCELLANEOUS) ×1
BLADE SURG 15 STRL LF DISP TIS (BLADE) ×1 IMPLANT
BLADE SURG 15 STRL SS (BLADE) ×2
BNDG GAUZE ELAST 4 BULKY (GAUZE/BANDAGES/DRESSINGS) IMPLANT
CHLORAPREP W/TINT 26 (MISCELLANEOUS) ×2 IMPLANT
COVER WAND RF STERILE (DRAPES) IMPLANT
DECANTER SPIKE VIAL GLASS SM (MISCELLANEOUS) IMPLANT
DRAPE LAPAROSCOPIC ABDOMINAL (DRAPES) ×1 IMPLANT
DRSG PAD ABDOMINAL 8X10 ST (GAUZE/BANDAGES/DRESSINGS) IMPLANT
ELECT REM PT RETURN 15FT ADLT (MISCELLANEOUS) ×2 IMPLANT
GAUZE SPONGE 4X4 12PLY STRL (GAUZE/BANDAGES/DRESSINGS) IMPLANT
GLOVE BIO SURGEON STRL SZ7.5 (GLOVE) ×7 IMPLANT
GOWN STRL REUS W/TWL LRG LVL3 (GOWN DISPOSABLE) ×6 IMPLANT
KIT BASIN OR (CUSTOM PROCEDURE TRAY) ×2 IMPLANT
KIT TURNOVER KIT A (KITS) ×1 IMPLANT
NDL HYPO 25X1 1.5 SAFETY (NEEDLE) IMPLANT
NEEDLE HYPO 25X1 1.5 SAFETY (NEEDLE) IMPLANT
NS IRRIG 1000ML POUR BTL (IV SOLUTION) ×2 IMPLANT
PENCIL SMOKE EVACUATOR (MISCELLANEOUS) IMPLANT
SPONGE LAP 18X18 RF (DISPOSABLE) ×1 IMPLANT
SUT MNCRL AB 4-0 PS2 18 (SUTURE) IMPLANT
SUT VIC AB 3-0 SH 18 (SUTURE) ×1 IMPLANT
SUT VIC AB 3-0 SH 27 (SUTURE)
SUT VIC AB 3-0 SH 27XBRD (SUTURE) IMPLANT
SWAB COLLECTION DEVICE MRSA (MISCELLANEOUS) IMPLANT
SWAB CULTURE ESWAB REG 1ML (MISCELLANEOUS) IMPLANT
SYR BULB 3OZ (MISCELLANEOUS) ×1 IMPLANT
SYR CONTROL 10ML LL (SYRINGE) IMPLANT
TOWEL OR 17X26 10 PK STRL BLUE (TOWEL DISPOSABLE) ×2 IMPLANT
YANKAUER SUCT BULB TIP NO VENT (SUCTIONS) ×2 IMPLANT

## 2019-05-09 NOTE — Anesthesia Postprocedure Evaluation (Signed)
Anesthesia Post Note  Patient: Gail Carlson  Procedure(s) Performed: EXCISION AND DRAINAGE OF CHRONIC SEROMA (N/A )     Patient location during evaluation: PACU Anesthesia Type: General Level of consciousness: sedated Pain management: pain level controlled Vital Signs Assessment: post-procedure vital signs reviewed and stable Respiratory status: spontaneous breathing and respiratory function stable Cardiovascular status: stable Postop Assessment: no apparent nausea or vomiting Anesthetic complications: no    Last Vitals:  Vitals:   05/09/19 1015 05/09/19 1041  BP: 126/70 133/71  Pulse: 67 66  Resp: 14 18  Temp: 36.5 C 36.7 C  SpO2: 100% 100%    Last Pain:  Vitals:   05/09/19 1041  TempSrc: Oral  PainSc:                  [name] DANIEL

## 2019-05-09 NOTE — Progress Notes (Signed)
Verbal order given by MD [name] for one time order of PO Benadryl 25 mg to be given.

## 2019-05-09 NOTE — Transfer of Care (Signed)
Immediate Anesthesia Transfer of Care Note  Patient: Gail Carlson  Procedure(s) Performed: EXCISION AND DRAINAGE OF CHRONIC SEROMA (N/A )  Patient Location: PACU  Anesthesia Type:General  Level of Consciousness: drowsy and patient cooperative  Airway & Oxygen Therapy: Patient Spontanous Breathing and Patient connected to face mask oxygen  Post-op Assessment: Report given to RN and Post -op Vital signs reviewed and stable  Post vital signs: Reviewed and stable  Last Vitals:  Vitals Value Taken Time  BP 140/74 05/09/19 0925  Temp    Pulse 69 05/09/19 0925  Resp 11 05/09/19 0925  SpO2 100 % 05/09/19 0925  Vitals shown include unvalidated device data.  Last Pain:  Vitals:   05/09/19 0641  TempSrc:   PainSc: 0-No pain      Patients Stated Pain Goal: 4 ([id number])  Complications: No apparent anesthesia complications

## 2019-05-09 NOTE — Interval H&P Note (Signed)
History and Physical Interval Note:  05/09/2019 8:17 AM  Gail Carlson  has presented today for surgery, with the diagnosis of CHRONIC SEROMA OF ABDOMINAL WALL.  The various methods of treatment have been discussed with the patient and family. After consideration of risks, benefits and other options for treatment, the patient has consented to    Procedure(s): EXCISION AND DRAINAGE OF CHRONIC SEROMA (N/A) as a surgical intervention.    The patient's history has been reviewed, patient examined, no change in status, stable for surgery.  I have reviewed the patient's chart and labs.  Questions were answered to the patient's satisfaction.    Armandina Gemma, MD [facility] Office: [place]

## 2019-05-09 NOTE — Op Note (Signed)
Operative Note  Pre-operative Diagnosis:  Chronic abdominal wall seroma  Post-operative Diagnosis:  same  Surgeon:  Armandina Gemma, MD  Assistant:  none   Procedure:  Drainage and debridement of chronic abdominal wall seroma with layered closure over drain  Anesthesia:  General (LMA)  Estimated Blood Loss:  minimal  Drains: 19Fr Blake drain to subcutaneous space         Specimen: seroma wall and contents to pathology  Indications:  Patient returns for follow-up of a large anterior abdominal wall seroma which is now become chronic. Patient has had multiple surgical procedures including her colon resections and repair of hernia. After her last procedure, she developed a significant seroma or hematoma which never fully resolved. We have attempted aspiration with interventional radiology but the collection recurred rapidly. Patient would now like to consider undergoing surgery for evacuation of the seroma, removal of the lining, and placement of a subcutaneous drain in hopes of resolving the chronic seroma. The seroma is fairly large in volume and anteriorly situated to the point that it causes discomfort and problems with wearing her clothing. There has been no sign of recurrent hernia. Patient presents today to discuss surgical intervention and timing of surgery.  Procedure:  The patient was seen in the pre-op holding area. The risks, benefits, complications, treatment options, and expected outcomes were previously discussed with the patient. The patient agreed with the proposed plan and has signed the informed consent form.  The patient was brought to the operating room by the surgical team, identified as Gail Carlson and the procedure verified. A "time out" was completed and the above information confirmed.  Following administration of general anesthesia the patient is prepped and draped in the usual aseptic fashion.  After ascertaining that an adequate level of anesthesia been  achieved, the previous midline incision was reopened with a #15 blade.  Dissection was carried through subcutaneous tissue.  There was a dense fibrous rim around the seroma.  This was incised with the electrocautery into the seroma cavity.  Yellow clear fluid was evacuated from the cavity.  There was a large amount of fibrinous debris within the cavity.  Using the electrocautery, the entire wall of the seroma cavity is excised down to the underlying abdominal wall fascia.  The wall of the seroma cavity is submitted to pathology.  The underlying fascia is then debrided to healthy tissue.  Good hemostasis is achieved with the electrocautery.  Cavity is irrigated copiously with warm saline which is evacuated.  A 19 [place] Blake drain is brought in from a right lower quadrant stab wound and placed in the base of the wound.  It is secured to the skin with a 3-0 nylon suture.  Subcutaneous tissues were then closed with interrupted 3-0 Vicryl sutures so as to close the subcutaneous tissues to the underlying fascia of the abdominal wall.  The more superficial layers of subcutaneous tissues are closed with interrupted 3-0 Vicryl sutures.  Skin is closed with stainless steel staples.  Dressings are applied.  Patient is awakened from anesthesia and brought to the recovery room.  Drain is placed to bulb suction.  The patient tolerated the procedure well.   Armandina Gemma, MD [facility] Office: [phone]

## 2019-05-09 NOTE — Anesthesia Procedure Notes (Signed)
Procedure Name: LMA Insertion Date/Time: 05/09/2019 8:33 AM Performed by: Montel Clock, CRNA Pre-anesthesia Checklist: Patient identified, Emergency Drugs available, Suction available, Patient being monitored and Timeout performed Patient Re-evaluated:Patient Re-evaluated prior to induction Oxygen Delivery Method: Circle system utilized Preoxygenation: Pre-oxygenation with 100% oxygen Induction Type: IV induction LMA: LMA with gastric port inserted LMA Size: 4.0 Number of attempts: 1 Dental Injury: Teeth and Oropharynx as per pre-operative assessment

## 2019-05-10 DIAGNOSIS — L7634 Postprocedural seroma of skin and subcutaneous tissue following other procedure: Secondary | ICD-10-CM | POA: Diagnosis not present

## 2019-05-10 LAB — SURGICAL PATHOLOGY

## 2019-05-10 MED ORDER — OXYCODONE HCL 5 MG PO TABS: 5.0000 mg | ORAL_TABLET | ORAL | 0 refills | Status: DC | PRN

## 2019-05-10 MED ORDER — LIP MEDEX EX OINT
TOPICAL_OINTMENT | CUTANEOUS | Status: AC
  Filled 2019-05-10: qty 7

## 2019-05-10 NOTE — Plan of Care (Signed)
Instructions were reviewed with patient. All questions were answered. Patient was transported to main entrance by wheelchair. ° °

## 2019-05-10 NOTE — Discharge Summary (Signed)
Physician Discharge Summary [facility]  Patient ID: Gail Carlson MRN: [id number] DOB/AGE: Sep 12, 1950 69 y.o.  Admit date: 05/09/2019 Discharge date: 05/10/2019  Admission Diagnoses:  Chronic seroma of abdominal wall.  Discharge Diagnoses:  Principal Problem:   Abdominal wall seroma Active Problems:   History of colon cancer   History of incisional hernia repair   Discharged Condition: good  Hospital Course: Patient was admitted for observation following abdominal surgery.  Post op course was uncomplicated.  Pain was well controlled.  Tolerated diet.  Patient was prepared for discharge home on POD#1.  Consults: None  Treatments: surgery: debridement of chronic seroma of abdominal wall, drain placement  Discharge Exam: Blood pressure (!) 128/110, pulse 80, temperature 98.7 F (37.1 C), temperature source Oral, resp. rate 17, height 5\' 3"  (1.6 m), weight 81.9 kg, SpO2 99 %. HEENT - clear Neck - soft Chest - clear bilaterally Cor - RRR Abd - soft without distension; serosanguinous in JP drain; wound dry and intact  Disposition: Home  Discharge Instructions    Diet - low sodium heart healthy   Complete by: As directed    Discharge instructions   Complete by: As directed    [place]:   Carry a list of your medications and allergies with you at all times  Call your pharmacy at least 1 week in advance to refill prescriptions  Do not mix any prescribed pain medicine with alcohol  Do not drive any motor vehicles while taking pain medication  Take medications with food unless otherwise directed  Follow-up appointments (date to return to physician): Please call [phone] to confirm your follow up appointment with your surgeon.  Call your Surgeon if you have:  Temperature greater than 101.0  Persistent nausea and vomiting  Severe uncontrolled pain  Redness, tenderness, or signs of infection  (pain, swelling, redness, odor or green/yellow discharge around the site)  Difficulty breathing, headache or visual disturbances  Hives  Persistent dizziness or light-headedness  Any other questions or concerns you may have after discharge  In an emergency, call 911 or go to an Emergency Department at a nearby hospital.   Diet: Begin with liquids, and if they are tolerated, resume your usual diet.  Avoid spicy, greasy or heavy foods.  If you have nausea or vomiting, go back to liquids.  If you cannot keep liquids down, call your doctor.  Avoid alcohol consumption while on prescription pain medications. Good nutrition promotes healing. Increase fiber and fluids.   ADDITIONAL INSTRUCTIONS: Patient to manage drain care - empty and measure output as needed.  May shower.  No lifting.  Call drain output measurements into [facility] office (nurse - Claiborne Billings) every other day please at [phone].  [facility] Office: [phone]   Discharge wound care:   Complete by: As directed    Instruct patient in drain care - empty and measure output as needed.  Record output.  Cover midline wound and drain site with dry gauze.  Change once daily and as needed.  Armandina Gemma, MD [facility] Office: [phone]   Increase activity slowly   Complete by: As directed      Allergies as of 05/10/2019   No Known Allergies     Medication List    TAKE these medications   Advil PM 200-25 MG Caps Generic drug: Ibuprofen-diphenhydrAMINE HCl Take 2 capsules by mouth at bedtime as needed (pain/sleep).   amLODipine 10 MG  tablet Commonly known as: NORVASC Take 1 tablet (10 mg total) by mouth daily.   cholecalciferol 25 MCG (1000 UNIT) tablet Commonly known as: VITAMIN D3 Take 1,000 Units by mouth daily.   citalopram 10 MG tablet Commonly known as: CELEXA Take 10 mg by mouth every evening.   ferrous sulfate 325 (65 FE) MG tablet take 1 tablet by mouth twice a day with  meals What changed: when to take this   lisinopril-hydrochlorothiazide 20-12.5 MG tablet Commonly known as: ZESTORETIC Take 1 tablet by mouth every evening.   meloxicam 15 MG tablet Commonly known as: MOBIC Take 15 mg by mouth daily.   oxyCODONE 5 MG immediate release tablet Commonly known as: Oxy IR/ROXICODONE Take 1-2 tablets (5-10 mg total) by mouth every 4 (four) hours as needed for moderate pain.            Discharge Care Instructions  (From admission, onward)         Start     Ordered   05/10/19 0000  Discharge wound care:    Comments: Instruct patient in drain care - empty and measure output as needed.  Record output.  Cover midline wound and drain site with dry gauze.  Change once daily and as needed.  Armandina Gemma, MD [facility] Office: [phone]   05/10/19 1006         Follow-up Information    Armandina Gemma, MD. Schedule an appointment as soon as possible for a visit in 1 week(s).   Specialty: General Surgery Why: Call drain output to office (nurse - Claiborne Billings) and we will tell you when to come in to have drain removed. Contact information: [address] [place] [address] [phone]           Earnstine Regal, MD, [facility] Office: [phone]   Signed: Armandina Gemma 05/10/2019, 10:07 AM

## 2019-06-05 ENCOUNTER — Other Ambulatory Visit: Payer: Self-pay | Admitting: Internal Medicine

## 2019-06-05 DIAGNOSIS — Z1231 Encounter for screening mammogram for malignant neoplasm of breast: Secondary | ICD-10-CM

## 2019-08-02 ENCOUNTER — Ambulatory Visit
Admission: RE | Admit: 2019-08-02 | Discharge: 2019-08-02 | Disposition: A | Discharge status: Home / Self Care | Payer: Medicare Other | Source: Ambulatory Visit | Attending: Internal Medicine | Admitting: Internal Medicine

## 2019-08-02 ENCOUNTER — Other Ambulatory Visit: Payer: Self-pay

## 2019-08-02 DIAGNOSIS — Z1231 Encounter for screening mammogram for malignant neoplasm of breast: Secondary | ICD-10-CM

## 2019-08-27 ENCOUNTER — Telehealth: Payer: Self-pay

## 2019-08-27 NOTE — Telephone Encounter (Signed)
   Lab and CTs in 6 months, appt with GBS 2-3 days after CTs in 6 months   Spoke with pt to update on upcoming appts ct scan labs and follow up per Np note 03/01/19 pt reminded of ct scan instructions and to come get contrast to drink prior to

## 2019-08-28 ENCOUNTER — Telehealth: Payer: Self-pay | Admitting: Nurse Practitioner

## 2019-08-28 NOTE — Telephone Encounter (Signed)
Rescheduled appointments per 7/27 scheduling message. Patient is aware of updated appointments date and time.  

## 2019-08-29 ENCOUNTER — Inpatient Hospital Stay: Payer: Medicare Other

## 2019-09-02 ENCOUNTER — Inpatient Hospital Stay: Payer: Medicare Other | Admitting: Oncology

## 2019-09-03 ENCOUNTER — Other Ambulatory Visit: Payer: Medicare Other

## 2019-09-03 ENCOUNTER — Encounter (HOSPITAL_COMMUNITY): Payer: Self-pay

## 2019-09-03 ENCOUNTER — Other Ambulatory Visit: Payer: Self-pay

## 2019-09-03 ENCOUNTER — Inpatient Hospital Stay: Payer: Medicare Other | Attending: Oncology

## 2019-09-03 ENCOUNTER — Ambulatory Visit (HOSPITAL_COMMUNITY)
Admission: RE | Admit: 2019-09-03 | Discharge: 2019-09-03 | Disposition: A | Discharge status: Home / Self Care | Payer: Medicare Other | Source: Ambulatory Visit | Attending: Nurse Practitioner | Admitting: Nurse Practitioner

## 2019-09-03 DIAGNOSIS — Z9221 Personal history of antineoplastic chemotherapy: Secondary | ICD-10-CM | POA: Diagnosis not present

## 2019-09-03 DIAGNOSIS — Z85038 Personal history of other malignant neoplasm of large intestine: Secondary | ICD-10-CM | POA: Diagnosis not present

## 2019-09-03 DIAGNOSIS — C189 Malignant neoplasm of colon, unspecified: Secondary | ICD-10-CM

## 2019-09-03 DIAGNOSIS — I10 Essential (primary) hypertension: Secondary | ICD-10-CM | POA: Diagnosis not present

## 2019-09-03 LAB — BASIC METABOLIC PANEL - CANCER CENTER ONLY
Anion gap: 10 (ref 5–15)
BUN: 17 mg/dL (ref 8–23)
CO2: 26 mmol/L (ref 22–32)
Calcium: 9.9 mg/dL (ref 8.9–10.3)
Chloride: 105 mmol/L (ref 98–111)
Creatinine: 1.35 mg/dL — ABNORMAL HIGH (ref 0.44–1.00)
GFR, Est AFR Am: 46 mL/min — ABNORMAL LOW (ref >60)
GFR, Estimated: 40 mL/min — ABNORMAL LOW (ref >60)
Glucose, Bld: 114 mg/dL — ABNORMAL HIGH (ref 70–99)
Potassium: 3.7 mmol/L (ref 3.5–5.1)
Sodium: 141 mmol/L (ref 135–145)

## 2019-09-03 LAB — CEA (IN HOUSE-CHCC): CEA (CHCC-In House): 2.78 ng/mL (ref 0.00–5.00)

## 2019-09-04 ENCOUNTER — Telehealth: Payer: Self-pay | Admitting: Nurse Practitioner

## 2019-09-04 NOTE — Telephone Encounter (Signed)
Scheduled appt per 8/3 sch msg - pt is aware of appt date and time

## 2019-09-05 ENCOUNTER — Inpatient Hospital Stay: Payer: Medicare Other | Admitting: Nurse Practitioner

## 2019-09-23 ENCOUNTER — Other Ambulatory Visit: Payer: Self-pay | Admitting: *Deleted

## 2019-09-23 DIAGNOSIS — C189 Malignant neoplasm of colon, unspecified: Secondary | ICD-10-CM

## 2019-09-24 ENCOUNTER — Encounter: Payer: Self-pay | Admitting: Nurse Practitioner

## 2019-09-24 ENCOUNTER — Telehealth: Payer: Self-pay | Admitting: Oncology

## 2019-09-24 ENCOUNTER — Ambulatory Visit (HOSPITAL_COMMUNITY)
Admission: RE | Admit: 2019-09-24 | Discharge: 2019-09-24 | Disposition: A | Discharge status: Home / Self Care | Payer: Medicare Other | Source: Ambulatory Visit | Attending: Nurse Practitioner | Admitting: Nurse Practitioner

## 2019-09-24 ENCOUNTER — Other Ambulatory Visit: Payer: Self-pay

## 2019-09-24 ENCOUNTER — Encounter (HOSPITAL_COMMUNITY): Payer: Self-pay

## 2019-09-24 ENCOUNTER — Inpatient Hospital Stay: Payer: Medicare Other

## 2019-09-24 ENCOUNTER — Inpatient Hospital Stay (HOSPITAL_BASED_OUTPATIENT_CLINIC_OR_DEPARTMENT_OTHER): Payer: Medicare Other | Admitting: Nurse Practitioner

## 2019-09-24 VITALS — BP 164/84 | HR 63 | Temp 97.9°F | Resp 20 | Ht 63.0 in | Wt 180.3 lb

## 2019-09-24 DIAGNOSIS — C189 Malignant neoplasm of colon, unspecified: Secondary | ICD-10-CM | POA: Diagnosis present

## 2019-09-24 DIAGNOSIS — Z85038 Personal history of other malignant neoplasm of large intestine: Secondary | ICD-10-CM | POA: Diagnosis not present

## 2019-09-24 HISTORY — DX: Disorder of kidney and ureter, unspecified: N28.9

## 2019-09-24 HISTORY — DX: Type 2 diabetes mellitus without complications: E11.9

## 2019-09-24 LAB — BASIC METABOLIC PANEL - CANCER CENTER ONLY
Anion gap: 8 (ref 5–15)
BUN: 14 mg/dL (ref 8–23)
CO2: 28 mmol/L (ref 22–32)
Calcium: 10.1 mg/dL (ref 8.9–10.3)
Chloride: 106 mmol/L (ref 98–111)
Creatinine: 1.51 mg/dL — ABNORMAL HIGH (ref 0.44–1.00)
GFR, Est AFR Am: 40 mL/min — ABNORMAL LOW (ref >60)
GFR, Estimated: 35 mL/min — ABNORMAL LOW (ref >60)
Glucose, Bld: 100 mg/dL — ABNORMAL HIGH (ref 70–99)
Potassium: 3.8 mmol/L (ref 3.5–5.1)
Sodium: 142 mmol/L (ref 135–145)

## 2019-09-24 MED ORDER — IOHEXOL 300 MG/ML  SOLN
75.0000 mL | Freq: Once | INTRAMUSCULAR | Status: AC | PRN
  Administered 2019-09-24: 75 mL via INTRAVENOUS

## 2019-09-24 MED ORDER — SODIUM CHLORIDE (PF) 0.9 % IJ SOLN
INTRAMUSCULAR | Status: AC
  Filled 2019-09-24: qty 50

## 2019-09-24 NOTE — Progress Notes (Signed)
[facility] OFFICE PROGRESS NOTE   Diagnosis: Colon cancer  INTERVAL HISTORY:   Gail Carlson returns for follow-up.  Overall she feels well.  No change in bowel habits.  No bleeding with bowel movements.  She has occasional nausea/vomiting.  Good appetite.  She continues to have intermittent pain left shoulder.  She reports prior evaluation by orthopedics.  On 05/09/2019 she underwent drainage and debridement of chronic abdominal wall seroma.  Pathology on seroma cavity, abdominal wall excision showed benign fibrous tissue with adherent fibrinous debris.  Objective:  Vital signs in last 24 hours:  Blood pressure (!) 164/84, pulse 63, temperature 97.9 F (36.6 C), temperature source Tympanic, resp. rate 20, height 5' 3" (1.6 m), weight 180 lb 4.8 oz (81.8 kg), SpO2 99 %.    HEENT: Neck without mass. Lymphatics: No palpable cervical, supraclavicular, axillary or inguinal lymph nodes. Resp: Lungs clear bilaterally. Cardio: Regular rate and rhythm. GI: Abdomen soft and nontender.  No hepatomegaly. Vascular: No leg edema.  Lab Results:  Lab Results  Component Value Date   WBC 6.1 04/30/2019   HGB 12.5 04/30/2019   HCT 41.5 04/30/2019   MCV 89.6 04/30/2019   PLT 269 04/30/2019   NEUTROABS 3.3 11/15/2017    Imaging:  No results found.  Medications: I have reviewed the patient's current medications.  Assessment/Plan: 1. 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 ? CTs of the chest, abdomen, and pelvis on 01/10/2014 with a cecal mass, nonspecific small liver lesions, and nonspecific lung nodules felt to most likely represent mucoid impaction ? Cycle 1 FOLFOX 02/20/2014 ? Cycle 2 FOLFOX 03/06/2014 ? Cycle 3 FOLFOX 03/20/2014 ? Cycle 4 FOLFOX 04/03/2014 ? Cycle 5 FOLFOX held on 04/17/2014 due to  neutropenia ? Cycle 5 FOLFOX 04/24/2014 with Neulasta support ? Cycle 6 FOLFOX 05/08/2014 ? Cycle 7 FOLFOX 05/22/2014 with Neulasta support ? Cycle 8 FOLFOX 06/05/2014 (oxaliplatin held secondary to thrombocytopenia) ? Cycle 9 FOLFOX 06/19/2014 with Neulasta support ? Cycle 10 FOLFOX 07/03/2014 (oxaliplatin held secondary to thrombocytopenia) ? Cycle 11 FOLFOX 07/17/2014 ? Cycle 12 FOLFOX 07/31/2014 (oxaliplatin dose reduced due to thrombocytopenia ? CTs chest, abdomen, and pelvis on 01/05/2015-no evidence of recurrent disease ? Colonoscopy 09/15/2015-tubular adenoma removed from the transverse colon ? CTs chest, abdomen, pelvis on 01/14/2016-no evidence of recurrent disease ? CTs 01/12/2017-no evidence of recurrent cancer. ? Colonoscopy 10/25/2017-1 cm deep firm ulceration on the colonic side distal aspect of the anastomosis. Pathology-adenocarcinoma ? CEA 11/15/2017-2.2 ? CT abdomen/pelvis 11/16/2017-no evidence of recurrent or metastatic carcinoma. Stable small hiatal hernia and periumbilical ventral hernia. Stable benign bilateral adrenal lesions. ? Resection of anastomosis 01/11/2018-adenocarcinoma, recurrent, moderately differentiated(2 cm) involving the prior anastomotic site; tumor invades muscularis propria; no carcinoma identified in 7 lymph nodes. Margins negative. ? CTs7/14/2020-masslike collection of fluid in the subcutaneous tissue of the umbilicus ? Ultrasound-guided aspiration of anterior abdominal wall complex fluid collection 09/07/2018, negative cytology ? Colonoscopy 10/30/2018-superficial ulceration at the ileocolonic anastomosis, biopsy negative for malignancy; repeat colonoscopy in 2 years for surveillance  2. History of Iron deficiency anemia 3. Family history of breast cancer 4. Port-A-Cath placement 02/18/2014 5. Delayed nausea following cycle 1 FOLFOX. Aloxi and Emend added beginning with cycle 2. Prophylactic Decadron added beginning with cycle  3. 6. Neutropenia following cycle 4 FOLFOX. Neulasta was added with cycle 5. 7. Hypokalemia 04/17/2014. Question secondary to diarrhea. Potassium supplement  initiated. 8. Left upper arm pain. Negative venous Doppler 03/07/2014. She was referred to orthopedics. 9. Thrombocytopenia secondary to chemotherapy 10. Mild oxaliplatin neuropathy 11. Hypertension 12. 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 Right facial cellulitis 07/15/2015-treated with doxycycline 14. 05/09/2019 drainage and debridement of chronic abdominal wall seroma.  Pathology on seroma cavity, abdominal wall excision-benign fibrous tissue with adherent fibrinous debris.    Disposition: Gail Carlson remains in clinical remission from colon cancer.  CEA from earlier this month was stable in the normal range.  She is scheduled for surveillance CT scans later today.  If negative we will see her back in 6 months with a CEA.  She will contact the office prior to her next visit with any problems.  Plan reviewed with Dr. Benay Spice.    Ned Card ANP/GNP-BC   09/24/2019  9:39 AM

## 2019-09-24 NOTE — Telephone Encounter (Signed)
Scheduled per 8/24 los. Mailing pt appt calendar per pt request

## 2019-09-25 ENCOUNTER — Other Ambulatory Visit: Payer: Self-pay | Admitting: Nurse Practitioner

## 2019-09-25 ENCOUNTER — Telehealth: Payer: Self-pay | Admitting: Nurse Practitioner

## 2019-09-25 DIAGNOSIS — C189 Malignant neoplasm of colon, unspecified: Secondary | ICD-10-CM

## 2019-09-25 NOTE — Telephone Encounter (Signed)
I spoke with [name] regarding labs from yesterday-creatinine higher than baseline.  Instructed her to hold lisinopril/hydrochlorothiazide, push fluids and return for repeat labs on 09/27/2019.  She agrees with this plan.

## 2019-10-01 ENCOUNTER — Other Ambulatory Visit: Payer: Self-pay

## 2019-10-01 ENCOUNTER — Inpatient Hospital Stay: Payer: Medicare Other

## 2019-10-01 DIAGNOSIS — Z85038 Personal history of other malignant neoplasm of large intestine: Secondary | ICD-10-CM | POA: Diagnosis not present

## 2019-10-01 DIAGNOSIS — C189 Malignant neoplasm of colon, unspecified: Secondary | ICD-10-CM

## 2019-10-01 LAB — BASIC METABOLIC PANEL - CANCER CENTER ONLY
Anion gap: 6 (ref 5–15)
BUN: 16 mg/dL (ref 8–23)
CO2: 28 mmol/L (ref 22–32)
Calcium: 10 mg/dL (ref 8.9–10.3)
Chloride: 108 mmol/L (ref 98–111)
Creatinine: 1.42 mg/dL — ABNORMAL HIGH (ref 0.44–1.00)
GFR, Est AFR Am: 43 mL/min — ABNORMAL LOW (ref >60)
GFR, Estimated: 37 mL/min — ABNORMAL LOW (ref >60)
Glucose, Bld: 94 mg/dL (ref 70–99)
Potassium: 3.7 mmol/L (ref 3.5–5.1)
Sodium: 142 mmol/L (ref 135–145)

## 2019-10-02 ENCOUNTER — Telehealth: Payer: Self-pay | Admitting: *Deleted

## 2019-10-02 NOTE — Telephone Encounter (Signed)
Per Dr. Benay Spice: creatinine improved at 1.42. OK to resume lisinopril/hydrochlorothiazie. Patient notified.

## 2019-10-04 ENCOUNTER — Telehealth: Payer: Self-pay | Admitting: *Deleted

## 2019-10-04 NOTE — Telephone Encounter (Signed)
Made aware that CT shows no cancer, but there is fluid building at incision site where it was drained prior. F/U with Dr. Johney Maine on this. She agrees to call his office next week. Faxed copy of CT to him.

## 2019-10-06 IMAGING — US IR ULTRASOUND GUIDED ASPIRATION/DRAINAGE
1 series · 13 of 25 positions shown · non-contrast
Comparison: none

INDICATION: 67-year-old with an anterior abdominal wall fluid collection.
History of colon surgery and incisional hernia repair.

[Series 1: ir ultrasound guided aspiration/drainage · 13 of 29 slices shown]
[im 1/29]
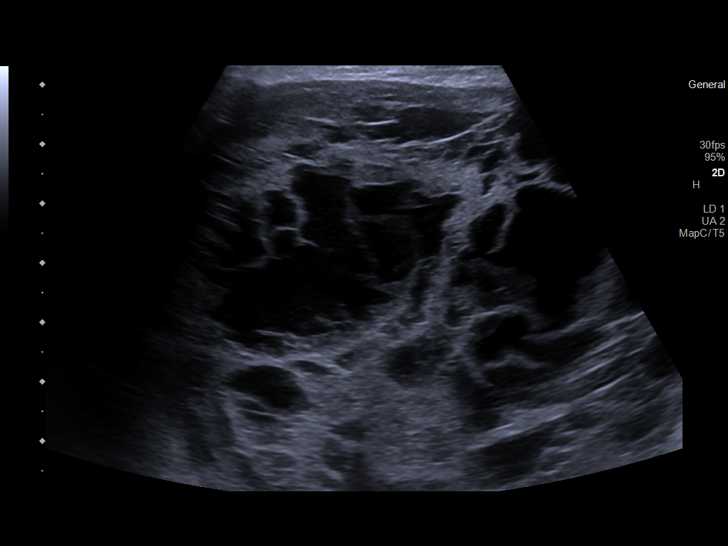
[im 3/29]
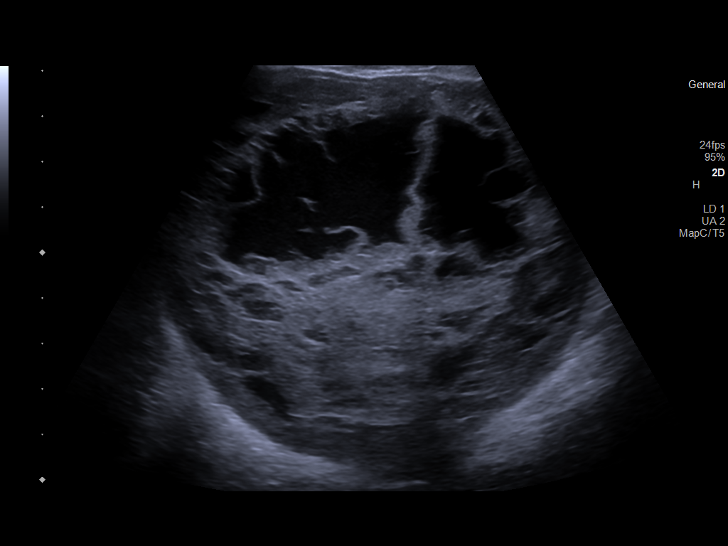
[im 5/29]
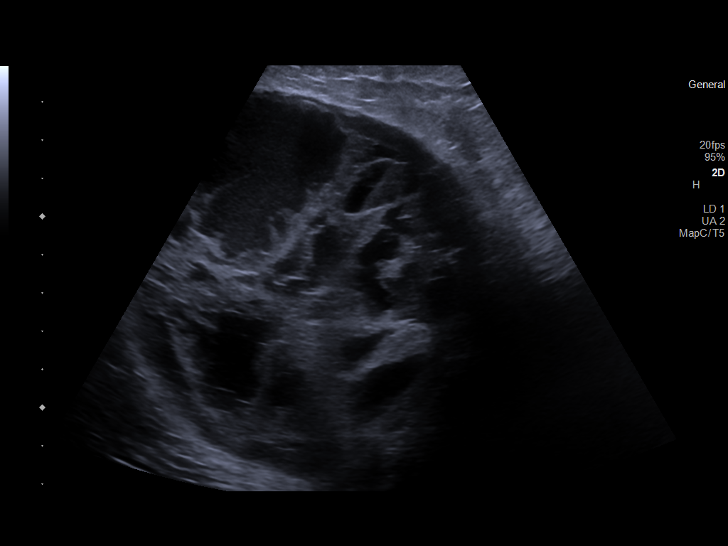
[im 8/29]
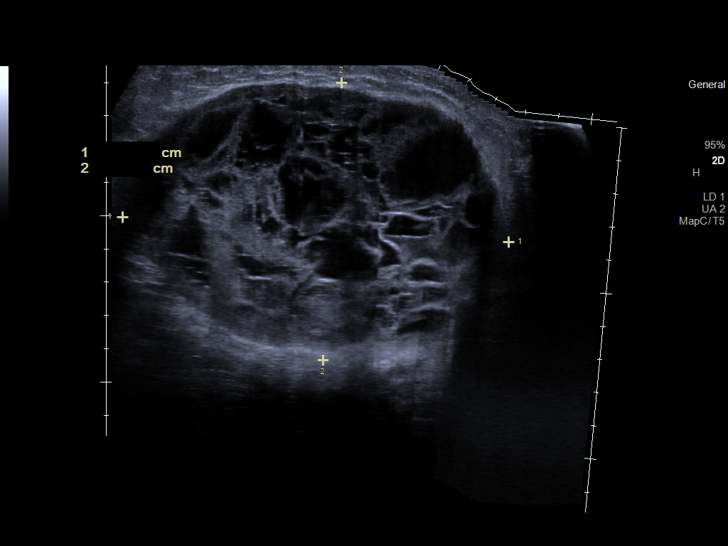
[im 10/29]
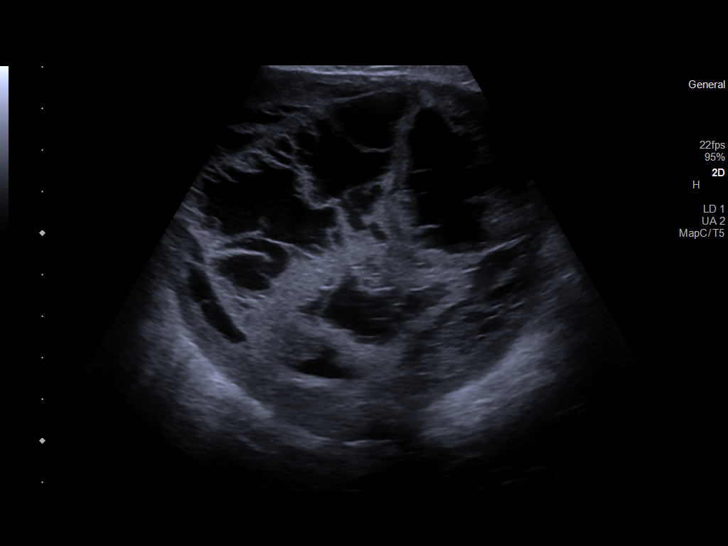
[im 12/29]
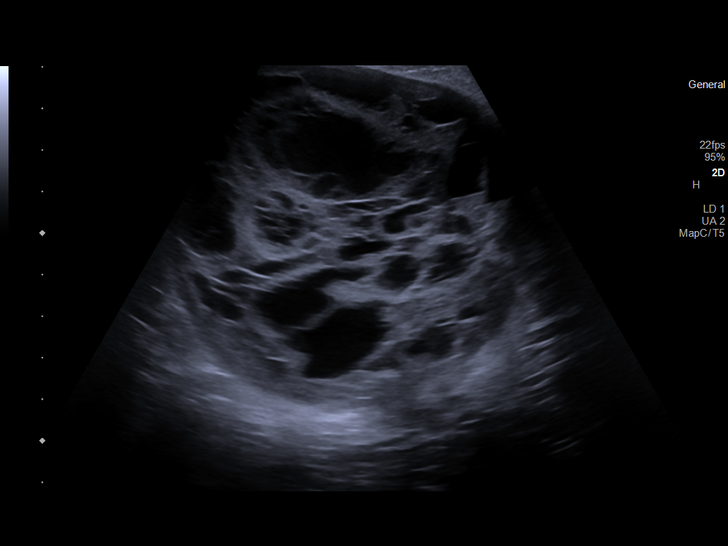
[im 15/29]
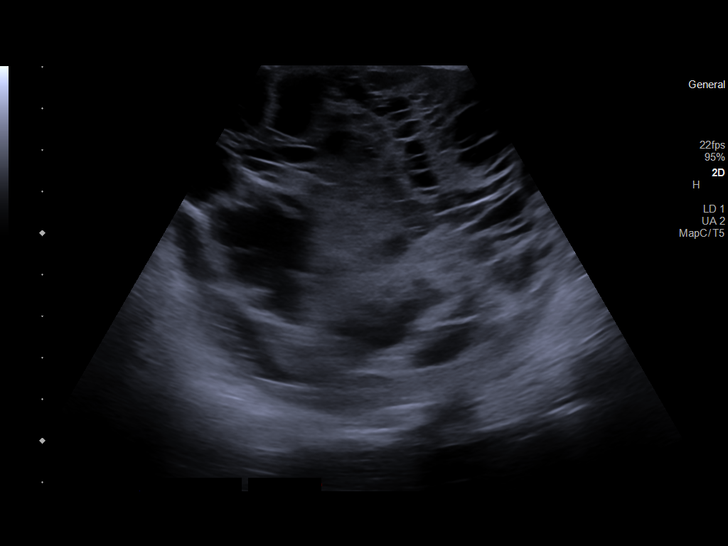
[im 17/29]
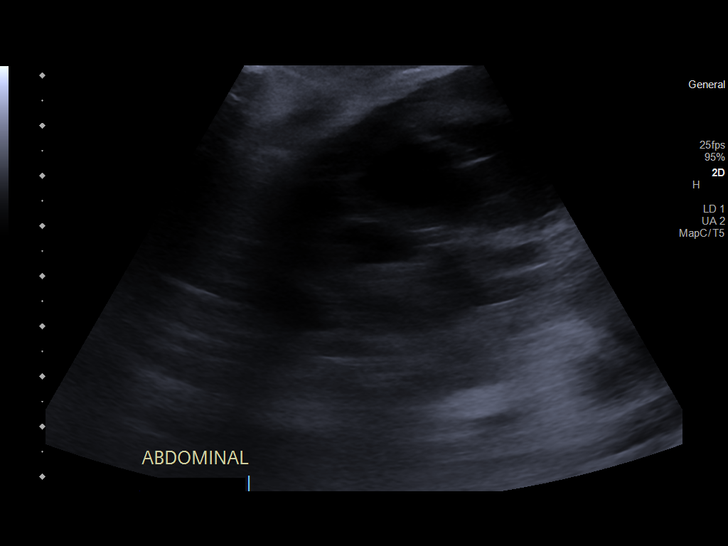
[im 19/29]
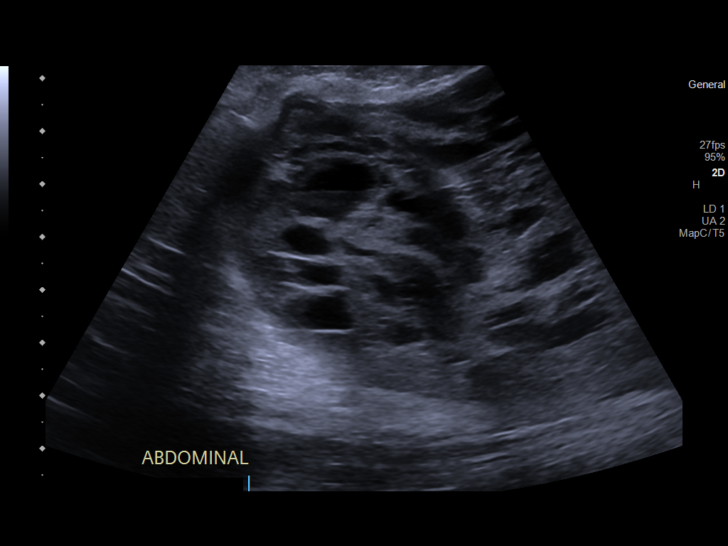
[im 22/29]
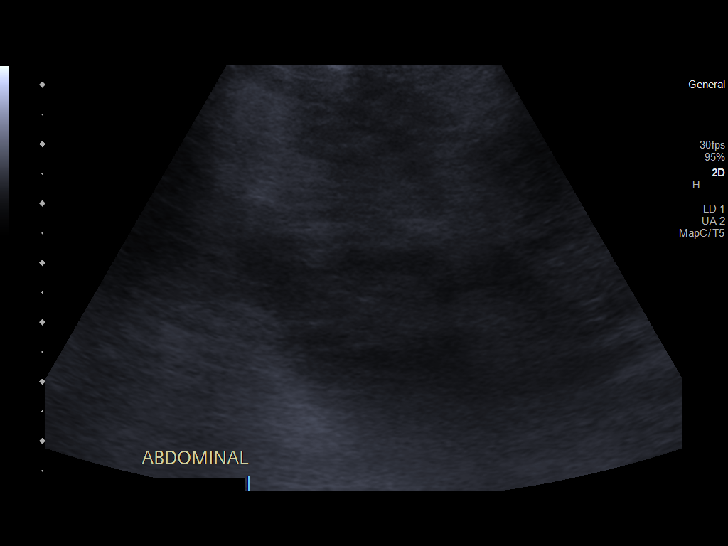
[im 24/29]
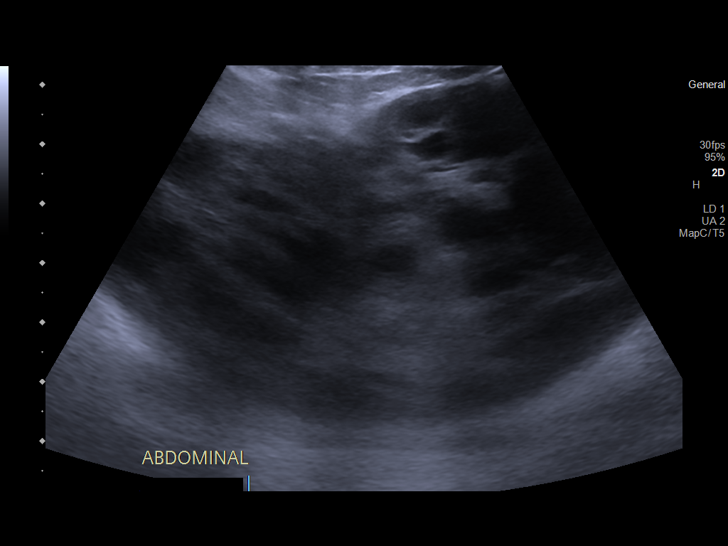
[im 26/29]
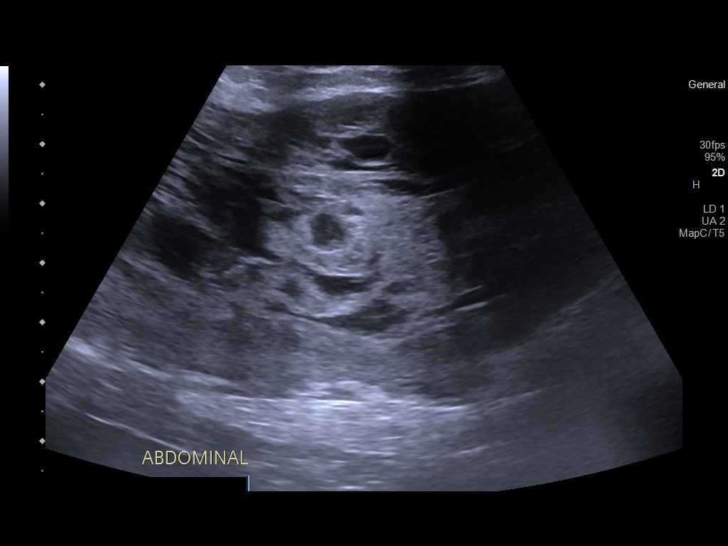
[im 29/29]
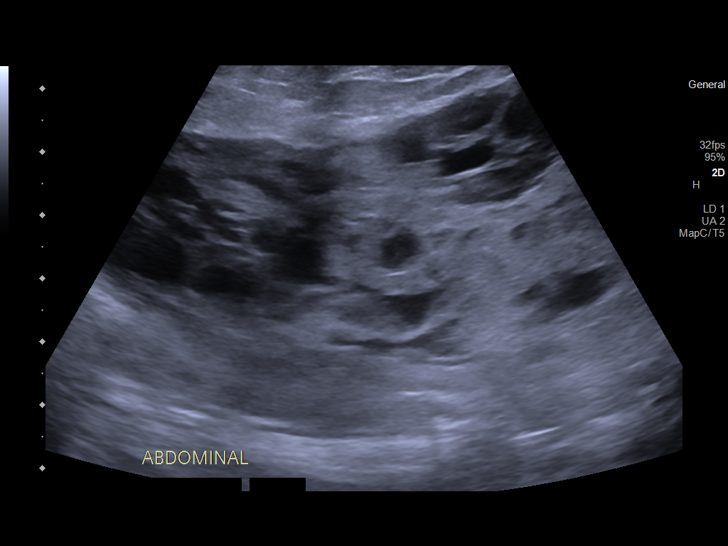

[13 of 25 positions shown; findings below may reference images not displayed]

EXAM:
ULTRASOUND-GUIDED ASPIRATION OF ABDOMINAL WALL FLUID COLLECTION

MEDICATIONS:
None

ANESTHESIA/SEDATION:
Fentanyl 75 mcg IV; Versed 1.5 mg IV

Moderate Sedation Time:  23 minutes

The patient was continuously monitored during the procedure by the
interventional radiology nurse under my direct supervision.

COMPLICATIONS:
None immediate.

PROCEDURE:
Informed written consent was obtained from the patient after a
thorough discussion of the procedural risks, benefits and
alternatives. All questions were addressed. A timeout was performed
prior to the initiation of the procedure.

The abdominal wall collection was identified with ultrasound. The
skin was prepped with chlorhexidine and sterile field was created.
Skin and soft tissues were anesthetized with 1% lidocaine. Using
ultrasound guidance, a Yueh catheter was directed into the complex
fluid collection and a large amount of dark red fluid was removed.
Findings were compatible with a hematoma. Needle was directed into
multiple pockets of fluid. Total of 175 mL of bloody fluid was
removed. Bandage placed over the puncture site.
FINDINGS: Large multi loculated fluid collection in the anterior abdominal
wall. Findings are most compatible with a chronic hematoma. Hematoma
measured 11.2 x 8.3 x 10.0 cm. Yueh catheter was directed into
multiple pockets of fluid. 175 mL of bloody fluid was removed.
IMPRESSION: Complex fluid collection in the anterior abdominal wall. 175 mL of
dark bloody fluid was removed. Findings are compatible with a
chronic hematoma. Fluid was sent for culture and cytology.

Following aspiration, the complex hematoma is smaller but there is
still a large amount of residual fluid/material.

## 2019-11-13 ENCOUNTER — Telehealth: Payer: Self-pay | Admitting: Internal Medicine

## 2020-02-18 ENCOUNTER — Inpatient Hospital Stay: Payer: Medicare Other | Attending: Oncology

## 2020-02-18 DIAGNOSIS — Z23 Encounter for immunization: Secondary | ICD-10-CM | POA: Insufficient documentation

## 2020-02-26 ENCOUNTER — Other Ambulatory Visit: Payer: Self-pay

## 2020-02-26 ENCOUNTER — Inpatient Hospital Stay: Payer: Medicare Other

## 2020-02-26 DIAGNOSIS — Z23 Encounter for immunization: Secondary | ICD-10-CM | POA: Diagnosis not present

## 2020-03-19 DIAGNOSIS — M169 Osteoarthritis of hip, unspecified: Secondary | ICD-10-CM | POA: Diagnosis not present

## 2020-03-19 DIAGNOSIS — M25552 Pain in left hip: Secondary | ICD-10-CM | POA: Diagnosis not present

## 2020-03-19 DIAGNOSIS — R7303 Prediabetes: Secondary | ICD-10-CM | POA: Diagnosis not present

## 2020-03-19 DIAGNOSIS — I10 Essential (primary) hypertension: Secondary | ICD-10-CM | POA: Diagnosis not present

## 2020-03-26 ENCOUNTER — Inpatient Hospital Stay: Payer: Medicare Other | Attending: Oncology | Admitting: Oncology

## 2020-03-26 ENCOUNTER — Other Ambulatory Visit: Payer: Self-pay

## 2020-03-26 ENCOUNTER — Inpatient Hospital Stay: Payer: Medicare Other

## 2020-03-26 VITALS — BP 168/89 | HR 72 | Temp 97.8°F | Resp 18 | Ht 63.0 in | Wt 178.6 lb

## 2020-03-26 DIAGNOSIS — K649 Unspecified hemorrhoids: Secondary | ICD-10-CM | POA: Diagnosis not present

## 2020-03-26 DIAGNOSIS — Z85038 Personal history of other malignant neoplasm of large intestine: Secondary | ICD-10-CM | POA: Insufficient documentation

## 2020-03-26 DIAGNOSIS — C189 Malignant neoplasm of colon, unspecified: Secondary | ICD-10-CM

## 2020-03-26 LAB — CEA (IN HOUSE-CHCC): CEA (CHCC-In House): 2.68 ng/mL (ref 0.00–5.00)

## 2020-03-26 NOTE — Progress Notes (Signed)
[place] OFFICE PROGRESS NOTE   Diagnosis: Colon cancer  INTERVAL HISTORY:   Gail Carlson returns for scheduled visit.  She feels well.  Good appetite.  She reports discomfort at the "hip ".  She was diagnosed with arthritis by Dr. Jeanie Carlson.  She has a "pulling "sensation at the left lateral abdominal wall near the costal margin.  This is intermittent.  Her chief complaint is hemorrhoid discomfort.  She has had hemorrhoids for years.  She has intermittent hemorrhoid related bleeding.  Objective:  Vital signs in last 24 hours:  Blood pressure (!) 168/89, pulse 72, temperature 97.8 F (36.6 C), temperature source Tympanic, resp. rate 18, height _0  (1.6 m), weight 178 lb 9.6 oz (81 kg), SpO2 99 %.    Lymphatics: No cervical, supraclavicular, axillary, or inguinal nodes Resp: Lungs clear bilaterally Cardio: Regular rate and rhythm GI: No hepatosplenomegaly, no mass, nontender, large soft external hemorrhoids Vascular: No leg edema Musculoskeletal: Tender at the left costal margin    Lab Results:  Lab Results  Component Value Date   WBC 6.1 04/30/2019   HGB 12.5 04/30/2019   HCT 41.5 04/30/2019   MCV 89.6 04/30/2019   PLT 269 04/30/2019   NEUTROABS 3.3 11/15/2017    CMP  Lab Results  Component Value Date   NA 142 10/01/2019   K 3.7 10/01/2019   CL 108 10/01/2019   CO2 28 10/01/2019   GLUCOSE 94 10/01/2019   BUN 16 10/01/2019   CREATININE 1.42 (H) 10/01/2019   CALCIUM 10.0 10/01/2019   PROT 7.1 11/15/2017   ALBUMIN 4.1 11/15/2017   AST 12 11/15/2017   ALT 8 11/15/2017   ALKPHOS 79 11/15/2017   BILITOT 0.5 11/15/2017   GFRNONAA 37 (L) 10/01/2019   GFRAA 43 (L) 10/01/2019    Lab Results  Component Value Date   CEA1 2.78 09/03/2019     Medications: I have reviewed the patient's current medications.   Assessment/Carlson: 1. Stage III (T3 N1), 1 positive lymph node and 2 satellite nodules, moderately differentiated adenocarcinoma of the cecum  with mucinous features, microsatellite stable, status post a right colectomy 01/11/2014. ? CTs of the chest, abdomen, and pelvis on 01/10/2014 with a cecal mass, nonspecific small liver lesions, and nonspecific lung nodules felt to most likely represent mucoid impaction ? Cycle 1 FOLFOX 02/20/2014 ? Cycle 2 FOLFOX 03/06/2014 ? Cycle 3 FOLFOX 03/20/2014 ? Cycle 4 FOLFOX 04/03/2014 ? Cycle 5 FOLFOX held on 04/17/2014 due to neutropenia ? Cycle 5 FOLFOX 04/24/2014 with Neulasta support ? Cycle 6 FOLFOX 05/08/2014 ? Cycle 7 FOLFOX 05/22/2014 with Neulasta support ? Cycle 8 FOLFOX 06/05/2014 (oxaliplatin held secondary to thrombocytopenia) ? Cycle 9 FOLFOX 06/19/2014 with Neulasta support ? Cycle 10 FOLFOX 07/03/2014 (oxaliplatin held secondary to thrombocytopenia) ? Cycle 11 FOLFOX 07/17/2014 ? Cycle 12 FOLFOX 07/31/2014 (oxaliplatin dose reduced due to thrombocytopenia ? CTs chest, abdomen, and pelvis on 01/05/2015-no evidence of recurrent disease ? Colonoscopy 09/15/2015-tubular adenoma removed from the transverse colon ? CTs chest, abdomen, pelvis on 01/14/2016-no evidence of recurrent disease ? CTs 01/12/2017-no evidence of recurrent cancer. ? Colonoscopy 10/25/2017-1 cm deep firm ulceration on the colonic side distal aspect of the anastomosis. Pathology-adenocarcinoma ? CEA 11/15/2017-2.2 ? CT abdomen/pelvis 11/16/2017-no evidence of recurrent or metastatic carcinoma. Stable small hiatal hernia and periumbilical ventral hernia. Stable benign bilateral adrenal lesions. ? Resection of anastomosis 01/11/2018-adenocarcinoma, recurrent, moderately differentiated(2 cm) involving the prior anastomotic site; tumor invades muscularis propria; no carcinoma identified in 7 lymph nodes. Margins negative. ?  CTs7/14/2020-masslike collection of fluid in the subcutaneous tissue of the umbilicus ? Ultrasound-guided aspiration of anterior abdominal wall complex fluid collection 09/07/2018, negative  cytology ? Colonoscopy 10/30/2018-superficial ulceration at the ileocolonic anastomosis, biopsy negative for malignancy; repeat colonoscopy in 2 years for surveillance ? CTs 09/24/2019-recurrent collection at the site of the previous subcutaneous fluid collection  2. History of Iron deficiency anemia 3. Family history of breast cancer 4. Port-A-Cath placement 02/18/2014 5. Delayed nausea following cycle 1 FOLFOX. Aloxi and Emend added beginning with cycle 2. Prophylactic Decadron added beginning with cycle 3. 6. Neutropenia following cycle 4 FOLFOX. Neulasta was added with cycle 5. 7. Hypokalemia 04/17/2014. Question secondary to diarrhea. Potassium supplement initiated. 8. Left upper arm pain. Negative venous Doppler 03/07/2014. She was referred to orthopedics. 9. Thrombocytopenia secondary to chemotherapy 10. Mild oxaliplatin neuropathy 11. Hypertension 12. 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 Right facial cellulitis 07/15/2015-treated with doxycycline 14. 05/09/2019 drainage and debridement of chronic abdominal wall seroma.  Pathology on seroma cavity, abdominal wall excision-benign fibrous tissue with adherent fibrinous debris.      Disposition: Gail Carlson is in remission from colon cancer.  We will follow up on the CEA from today.  She will be scheduled for an office visit and restaging CTs in 6 months.  I will refer her to Gail Carlson to evaluate the hemorrhoids.  The pain at the left costal margin is likely benign musculoskeletal pain.  She will call for increased pain. She plans to schedule a colonoscopy with Gail Carlson for this year.  Gail Coder, MD  03/26/2020  10:16 AM

## 2020-03-27 ENCOUNTER — Encounter: Payer: Self-pay | Admitting: *Deleted

## 2020-03-27 ENCOUNTER — Telehealth: Payer: Self-pay | Admitting: *Deleted

## 2020-03-27 ENCOUNTER — Telehealth: Payer: Self-pay | Admitting: Oncology

## 2020-03-27 NOTE — Telephone Encounter (Signed)
Notified of normal CEA and to follow up as scheduled.

## 2020-03-27 NOTE — Telephone Encounter (Signed)
Scheduled appointments per 2/24 los. Spoke to patient who is aware of appointments date and times. Mailed calendar to patient per request. Wrote Dr. Gearldine Shown new address on letter for patient.

## 2020-03-27 NOTE — Telephone Encounter (Signed)
-----   Message from Ladell Pier, MD sent at 03/26/2020  2:25 PM EST ----- Please call patient, CEA is normal

## 2020-03-27 NOTE — Progress Notes (Signed)
Faxed referral order, demographics, last office note to [facility] [phone] for visit with Dr. [name] to evaluate hemorrhoids.

## 2020-04-23 ENCOUNTER — Encounter: Payer: Self-pay | Admitting: *Deleted

## 2020-04-23 NOTE — Progress Notes (Signed)
Called patient to f/u on her referral on 03/27/20 to Dr. [name] for hemorrhoids. She reports she was assigned to see Dr. Leighton Ruff there on 05/12/20.

## 2020-06-23 ENCOUNTER — Other Ambulatory Visit: Payer: Self-pay | Admitting: Internal Medicine

## 2020-06-23 DIAGNOSIS — Z1231 Encounter for screening mammogram for malignant neoplasm of breast: Secondary | ICD-10-CM

## 2020-07-28 DIAGNOSIS — I10 Essential (primary) hypertension: Secondary | ICD-10-CM | POA: Diagnosis not present

## 2020-07-28 DIAGNOSIS — F419 Anxiety disorder, unspecified: Secondary | ICD-10-CM | POA: Diagnosis not present

## 2020-07-28 DIAGNOSIS — R7303 Prediabetes: Secondary | ICD-10-CM | POA: Diagnosis not present

## 2020-07-28 DIAGNOSIS — F17210 Nicotine dependence, cigarettes, uncomplicated: Secondary | ICD-10-CM | POA: Diagnosis not present

## 2020-07-30 ENCOUNTER — Other Ambulatory Visit: Payer: Self-pay | Admitting: Internal Medicine

## 2020-07-30 DIAGNOSIS — E2839 Other primary ovarian failure: Secondary | ICD-10-CM

## 2020-08-20 ENCOUNTER — Ambulatory Visit: Payer: Medicare Other

## 2020-09-03 ENCOUNTER — Other Ambulatory Visit: Payer: Self-pay

## 2020-09-03 ENCOUNTER — Ambulatory Visit
Admission: RE | Admit: 2020-09-03 | Discharge: 2020-09-03 | Disposition: A | Discharge status: Home / Self Care | Payer: Medicare Other | Source: Ambulatory Visit | Attending: Internal Medicine | Admitting: Internal Medicine

## 2020-09-03 DIAGNOSIS — Z1231 Encounter for screening mammogram for malignant neoplasm of breast: Secondary | ICD-10-CM | POA: Diagnosis not present

## 2020-09-23 ENCOUNTER — Encounter (HOSPITAL_BASED_OUTPATIENT_CLINIC_OR_DEPARTMENT_OTHER): Payer: Self-pay

## 2020-09-23 ENCOUNTER — Inpatient Hospital Stay: Payer: Medicare Other | Attending: Oncology

## 2020-09-23 ENCOUNTER — Other Ambulatory Visit: Payer: Self-pay

## 2020-09-23 ENCOUNTER — Encounter: Payer: Self-pay | Admitting: Nurse Practitioner

## 2020-09-23 ENCOUNTER — Ambulatory Visit (HOSPITAL_BASED_OUTPATIENT_CLINIC_OR_DEPARTMENT_OTHER)
Admission: RE | Admit: 2020-09-23 | Discharge: 2020-09-23 | Disposition: A | Discharge status: Home / Self Care | Payer: Medicare Other | Source: Ambulatory Visit | Attending: Oncology | Admitting: Oncology

## 2020-09-23 ENCOUNTER — Inpatient Hospital Stay (HOSPITAL_BASED_OUTPATIENT_CLINIC_OR_DEPARTMENT_OTHER): Payer: Medicare Other | Admitting: Nurse Practitioner

## 2020-09-23 ENCOUNTER — Telehealth: Payer: Self-pay | Admitting: Oncology

## 2020-09-23 VITALS — BP 170/80 | HR 62 | Temp 98.1°F | Resp 20 | Ht 63.0 in | Wt 171.0 lb

## 2020-09-23 DIAGNOSIS — N2889 Other specified disorders of kidney and ureter: Secondary | ICD-10-CM | POA: Diagnosis not present

## 2020-09-23 DIAGNOSIS — Z9221 Personal history of antineoplastic chemotherapy: Secondary | ICD-10-CM | POA: Insufficient documentation

## 2020-09-23 DIAGNOSIS — I700 Atherosclerosis of aorta: Secondary | ICD-10-CM | POA: Diagnosis not present

## 2020-09-23 DIAGNOSIS — Z85038 Personal history of other malignant neoplasm of large intestine: Secondary | ICD-10-CM | POA: Diagnosis not present

## 2020-09-23 DIAGNOSIS — C189 Malignant neoplasm of colon, unspecified: Secondary | ICD-10-CM | POA: Insufficient documentation

## 2020-09-23 DIAGNOSIS — K449 Diaphragmatic hernia without obstruction or gangrene: Secondary | ICD-10-CM | POA: Diagnosis not present

## 2020-09-23 DIAGNOSIS — C20 Malignant neoplasm of rectum: Secondary | ICD-10-CM | POA: Diagnosis not present

## 2020-09-23 LAB — BASIC METABOLIC PANEL - CANCER CENTER ONLY
Anion gap: 7 (ref 5–15)
BUN: 12 mg/dL (ref 8–23)
CO2: 28 mmol/L (ref 22–32)
Calcium: 9.3 mg/dL (ref 8.9–10.3)
Chloride: 106 mmol/L (ref 98–111)
Creatinine: 1.3 mg/dL — ABNORMAL HIGH (ref 0.44–1.00)
GFR, Estimated: 44 mL/min — ABNORMAL LOW (ref >60)
Glucose, Bld: 96 mg/dL (ref 70–99)
Potassium: 4 mmol/L (ref 3.5–5.1)
Sodium: 141 mmol/L (ref 135–145)

## 2020-09-23 LAB — CEA (IN HOUSE-CHCC): CEA (CHCC-In House): 3.59 ng/mL (ref 0.00–5.00)

## 2020-09-23 LAB — CEA (ACCESS): CEA (CHCC): 3.67 ng/mL (ref 0.00–5.00)

## 2020-09-23 MED ORDER — IOHEXOL 350 MG/ML SOLN
60.0000 mL | Freq: Once | INTRAVENOUS | Status: AC | PRN
  Administered 2020-09-23: 60 mL via INTRAVENOUS

## 2020-09-23 NOTE — Progress Notes (Signed)
[place] OFFICE PROGRESS NOTE   Diagnosis: Colon cancer  INTERVAL HISTORY:   Gail Carlson returns as scheduled.  She in general feels well.  No change in bowel habits.  She continues to have intermittent hemorrhoid related bleeding.  She was referred to surgery following last office visit in February of this year but decided to not pursue this.  She has a good appetite.  She denies abdominal pain.  Objective:  Vital signs in last 24 hours:  Blood pressure (!) 170/80, pulse 62, temperature 98.1 F (36.7 C), temperature source Oral, resp. rate 20, height _0  (1.6 m), weight 171 lb (77.6 kg), SpO2 100 %.    HEENT: Neck without mass. Lymphatics: No palpable cervical, supraclavicular, axillary or inguinal lymph nodes. Resp: Lungs clear bilaterally. Cardio: Regular rate and rhythm. GI: Abdomen soft and nontender.  No hepatomegaly.  No mass. Vascular: No leg edema.   Lab Results:  Lab Results  Component Value Date   WBC 6.1 04/30/2019   HGB 12.5 04/30/2019   HCT 41.5 04/30/2019   MCV 89.6 04/30/2019   PLT 269 04/30/2019   NEUTROABS 3.3 11/15/2017    Imaging:  No results found.  Medications: I have reviewed the patient's current medications.  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 CTs of the chest, abdomen, and pelvis on 01/10/2014 with a cecal mass, nonspecific small liver lesions, and nonspecific lung nodules felt to most likely represent mucoid impaction Cycle 1 FOLFOX 02/20/2014 Cycle 2 FOLFOX 03/06/2014 Cycle 3 FOLFOX 03/20/2014 Cycle 4 FOLFOX 04/03/2014 Cycle 5 FOLFOX held on 04/17/2014 due to neutropenia Cycle 5 FOLFOX 04/24/2014 with Neulasta support Cycle 6 FOLFOX 05/08/2014 Cycle 7 FOLFOX 05/22/2014 with Neulasta support Cycle 8 FOLFOX 06/05/2014  (oxaliplatin held secondary to thrombocytopenia) Cycle 9 FOLFOX 06/19/2014 with Neulasta support Cycle 10 FOLFOX 07/03/2014 (oxaliplatin held secondary to thrombocytopenia) Cycle 11 FOLFOX 07/17/2014 Cycle 12 FOLFOX 07/31/2014 (oxaliplatin dose reduced due to thrombocytopenia CTs chest, abdomen, and pelvis on 01/05/2015-no evidence of recurrent disease Colonoscopy 09/15/2015-tubular adenoma removed from the transverse colon CTs chest, abdomen, pelvis on 01/14/2016-no evidence of recurrent disease CTs 01/12/2017-no evidence of recurrent cancer. Colonoscopy 10/25/2017-1 cm deep firm ulceration on the colonic side distal aspect of the anastomosis.  Pathology-adenocarcinoma CEA 11/15/2017-2.2 CT abdomen/pelvis 11/16/2017- no evidence of recurrent or metastatic carcinoma.  Stable small hiatal hernia and periumbilical ventral hernia.  Stable benign bilateral adrenal lesions. Resection of anastomosis 01/11/2018-adenocarcinoma, recurrent, moderately differentiated (2 cm) involving the prior anastomotic site; tumor invades muscularis propria; no carcinoma identified in 7 lymph nodes.  Margins negative. CTs 08/14/2018-masslike collection of fluid in the subcutaneous tissue of the umbilicus Ultrasound-guided aspiration of anterior abdominal wall complex fluid collection 09/07/2018, negative cytology Colonoscopy 10/30/2018-superficial ulceration at the ileocolonic anastomosis, biopsy negative for malignancy; repeat colonoscopy in 2 years for surveillance CTs 09/24/2019-recurrent collection at the site of the previous subcutaneous fluid collection CTs 09/23/2020-result pending    History of Iron deficiency anemia Family history of breast cancer Port-A-Cath placement 02/18/2014 Delayed nausea following cycle 1 FOLFOX. Aloxi and Emend added beginning with cycle 2. Prophylactic Decadron added beginning with cycle 3. Neutropenia following cycle 4 FOLFOX. Neulasta was added with cycle 5. Hypokalemia 04/17/2014.  Question secondary to diarrhea. Potassium supplement initiated. Left upper arm pain. Negative venous Doppler 03/07/2014. She was referred to orthopedics. 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  Pathology on seroma cavity, abdominal wall excision-benign fibrous tissue with adherent fibrinous debris.        Disposition: Gail Carlson remains in clinical remission from colon cancer.  We will follow-up on surveillance CT scans done earlier today.  She is due for a colonoscopy in September.  We made a referral to Dr. Fuller Plan.  She will also discuss management of hemorrhoids.  She will return for CEA and follow-up visit in 6 months.  We are available to see her sooner if needed.    Ned Card ANP/GNP-BC   09/23/2020  11:52 AM

## 2020-09-23 NOTE — Telephone Encounter (Signed)
Scheduled appts per 8/24 los. Pt aware.

## 2020-10-13 ENCOUNTER — Encounter: Payer: Self-pay | Admitting: Gastroenterology

## 2020-10-13 ENCOUNTER — Other Ambulatory Visit: Payer: Self-pay | Admitting: *Deleted

## 2020-10-13 DIAGNOSIS — C189 Malignant neoplasm of colon, unspecified: Secondary | ICD-10-CM

## 2020-11-19 ENCOUNTER — Encounter: Payer: Self-pay | Admitting: Gastroenterology

## 2020-11-19 ENCOUNTER — Ambulatory Visit (INDEPENDENT_AMBULATORY_CARE_PROVIDER_SITE_OTHER): Payer: Medicare Other | Admitting: Gastroenterology

## 2020-11-19 VITALS — BP 180/102 | HR 84 | Ht 63.0 in | Wt 172.0 lb

## 2020-11-19 DIAGNOSIS — Z85038 Personal history of other malignant neoplasm of large intestine: Secondary | ICD-10-CM | POA: Diagnosis not present

## 2020-11-19 DIAGNOSIS — K921 Melena: Secondary | ICD-10-CM | POA: Diagnosis not present

## 2020-11-19 DIAGNOSIS — K219 Gastro-esophageal reflux disease without esophagitis: Secondary | ICD-10-CM

## 2020-11-19 DIAGNOSIS — K648 Other hemorrhoids: Secondary | ICD-10-CM | POA: Diagnosis not present

## 2020-11-19 MED ORDER — FAMOTIDINE 40 MG PO TABS: 40.0000 mg | ORAL_TABLET | Freq: Every day | ORAL | 11 refills | Status: DC

## 2020-11-19 MED ORDER — NA SULFATE-K SULFATE-MG SULF 17.5-3.13-1.6 GM/177ML PO SOLN: 1.0000 | Freq: Once | ORAL | 0 refills | Status: AC

## 2020-11-19 NOTE — Progress Notes (Signed)
    History of Present Illness: This is a 70 year old female with a history of stage III colon adenocarcinoma.  She underwent right hemicolectomy in 2015 and she completed a course of chemotherapy.  Her most recent oncology follow-up was in August 2022 and she is felt to be in clinical remission.  She relates frequent problems with bright red blood per rectum when wiping after bowel movement for a few months.  She notes frequent straining.  Since making an effort to reduce straining she has noticed less bleeding with bowel movements.  She is to use Preparation H wipes as well.  In addition she relates frequent dyspeptic and reflux symptoms occurring a few times per week but also notable most mornings.  Colonoscopy 10/2018 - Patent side-to-side ileo-colonic anastomosis, characterized by a superficial ulceration. Biopsied. - Internal hemorrhoids.  Current Medications, Allergies, Past Medical History, Past Surgical History, Family History and Social History were reviewed in [employer] record.   Physical Exam: General: Well developed, well nourished, no acute distress Head: Normocephalic and atraumatic Eyes: Sclerae anicteric, EOMI Ears: Normal auditory acuity Mouth: Not examined, mask on during Covid-19 pandemic Lungs: Clear throughout to auscultation Heart: Regular rate and rhythm; no murmurs, rubs or bruits Abdomen: Soft, non tender and non distended. No masses, hepatosplenomegaly or hernias noted. Normal Bowel sounds Rectal: Deferred to colonoscopy Musculoskeletal: Symmetrical with no gross deformities  Pulses:  Normal pulses noted Extremities: No clubbing, cyanosis, edema or deformities noted Neurological: Alert oriented x 4, grossly nonfocal Psychological:  Alert and cooperative. Normal mood and affect   Assessment and Recommendations:  Personal history colon cancer stage III in remission. Schedule colonoscopy. The risks (including bleeding, perforation,  infection, missed lesions, medication reactions and possible hospitalization or surgery if complications occur), benefits, and alternatives to colonoscopy with possible biopsy and possible polypectomy were discussed with the patient and they consent to proceed.   Bleeding internal hemorrhoids. Rectal care instruction. Avoid straining. Benefiber daily.  Adequate water intake daily. Prep H supp qd prn.  Preparation H wipes daily as needed. Suspected GERD. Start famotdine 40 mg qd. Follow antireflux measures.

## 2020-11-19 NOTE — Addendum Note (Signed)
Addended by: Dorisann Frames L on: 11/19/2020 03:26 PM   Modules accepted: Orders

## 2020-11-19 NOTE — Patient Instructions (Signed)
You have been scheduled for a colonoscopy. Please follow written instructions given to you at your visit today.  Please pick up your prep supplies at the pharmacy within the next 1-3 days. If you use inhalers (even only as needed), please bring them with you on the day of your procedure.  Start over the counter Citracel clear or Benefiber daily.   Also, purchase over the counter preparation H suppositories to place rectally daily.   RECTAL CARE INSTRUCTIONS:  1. Sitz Baths twice a day for 10 minutes each. 2. Thoroughly clean and dry the rectum. 3. Put Tucks pad against the rectum at night. 4. Clean the rectum with Balenol lotion after each bowel movement.  Due to recent changes in healthcare laws, you may see the results of your imaging and laboratory studies on MyChart before your provider has had a chance to review them.  We understand that in some cases there may be results that are confusing or concerning to you. Not all laboratory results come back in the same time frame and the provider may be waiting for multiple results in order to interpret others.  Please give [name] 48 hours in order for your provider to thoroughly review all the results before contacting the office for clarification of your results.   The  GI providers would like to encourage you to use [facility] to communicate with providers for non-urgent requests or questions.  Due to long hold times on the telephone, sending your provider a message by [facility] may be a faster and more efficient way to get a response.  Please allow 48 business hours for a response.  Please remember that this is for non-urgent requests.   Thank you for choosing me and [facility].  Pricilla Riffle. Dagoberto Ligas., MD., [facility]

## 2020-11-25 ENCOUNTER — Telehealth: Payer: Self-pay | Admitting: Gastroenterology

## 2020-11-25 NOTE — Telephone Encounter (Signed)
Inbound call from pt requesting a call back stating that her prep is too expensive and wants to know if it's an alternative.Please advise. Thank you.

## 2020-11-25 NOTE — Telephone Encounter (Signed)
Patient states she cannot afford the prep. Patient states she went through a similar time when she tried to pick up her prep at her last colonoscopy. Patient states we gave her a sample of the prep instead. Informed patient we do not get samples of the Suprep but we do not have any sample at this time of any other preps. Patient asked if we would call her when we do get some in. Informed patient I will print new instructions for Plenvu and call her when we get a prep in. Patient verbalized understanding.

## 2020-12-01 DIAGNOSIS — Z0000 Encounter for general adult medical examination without abnormal findings: Secondary | ICD-10-CM | POA: Diagnosis not present

## 2020-12-01 DIAGNOSIS — I10 Essential (primary) hypertension: Secondary | ICD-10-CM | POA: Diagnosis not present

## 2020-12-01 DIAGNOSIS — R7303 Prediabetes: Secondary | ICD-10-CM | POA: Diagnosis not present

## 2020-12-01 DIAGNOSIS — C189 Malignant neoplasm of colon, unspecified: Secondary | ICD-10-CM | POA: Diagnosis not present

## 2020-12-01 NOTE — Telephone Encounter (Signed)
Informed patient new instructions and Plenvu sample left up front for patient to pick up. Patient verbalized understanding.

## 2020-12-28 ENCOUNTER — Ambulatory Visit (AMBULATORY_SURGERY_CENTER): Payer: Medicare Other | Admitting: Gastroenterology

## 2020-12-28 ENCOUNTER — Encounter: Payer: Self-pay | Admitting: Gastroenterology

## 2020-12-28 VITALS — BP 167/87 | HR 84 | Temp 97.8°F | Resp 18 | Ht 63.0 in | Wt 172.0 lb

## 2020-12-28 DIAGNOSIS — K921 Melena: Secondary | ICD-10-CM

## 2020-12-28 DIAGNOSIS — D123 Benign neoplasm of transverse colon: Secondary | ICD-10-CM

## 2020-12-28 DIAGNOSIS — K640 First degree hemorrhoids: Secondary | ICD-10-CM | POA: Diagnosis not present

## 2020-12-28 DIAGNOSIS — Z85038 Personal history of other malignant neoplasm of large intestine: Secondary | ICD-10-CM | POA: Diagnosis not present

## 2020-12-28 MED ORDER — SODIUM CHLORIDE 0.9 % IV SOLN: 500.0000 mL | Freq: Once | INTRAVENOUS | Status: DC

## 2020-12-28 NOTE — Progress Notes (Signed)
To pacu, VSS. Report to [email or web address] 

## 2020-12-28 NOTE — Op Note (Signed)
[facility] Patient Name: Gail Carlson Procedure Date: 12/28/2020 1:53 PM MRN: [id number] Endoscopist: Ladene Artist , MD Age: 70 Referring MD:  Date of Birth: 09-10-1950 Gender: Female Account #: [id number] Procedure:                Colonoscopy Indications:              Hematochezia, Personal history of colon cancer. Medicines:                Monitored Anesthesia Care Procedure:                Pre-Anesthesia Assessment:                           - Prior to the procedure, a History and Physical                            was performed, and patient medications and                            allergies were reviewed. The patient's tolerance of                            previous anesthesia was also reviewed. The risks                            and benefits of the procedure and the sedation                            options and risks were discussed with the patient.                            All questions were answered, and informed consent                            was obtained. Prior Anticoagulants: The patient has                            taken no previous anticoagulant or antiplatelet                            agents. ASA Grade Assessment: II - A patient with                            mild systemic disease. After reviewing the risks                            and benefits, the patient was deemed in                            satisfactory condition to undergo the procedure.                           After obtaining informed consent, the colonoscope  was passed under direct vision. Throughout the                            procedure, the patient's blood pressure, pulse, and                            oxygen saturations were monitored continuously. The                            Olympus PCF-H190DL (#[id number]) Colonoscope was                            introduced through the anus and advanced to the the                            ileocolonic  anastomosis. The rectum was                            photographed. The quality of the bowel preparation                            was good. The colonoscopy was performed without                            difficulty. The patient tolerated the procedure                            well. Scope In: 2:10:49 PM Scope Out: 2:21:25 PM Scope Withdrawal Time: 0 hours 8 minutes 11 seconds  Total Procedure Duration: 0 hours 10 minutes 36 seconds  Findings:                 The perianal and digital rectal examinations were                            normal.                           A 7 mm polyp was found in the transverse colon. The                            polyp was sessile. The polyp was removed with a                            cold snare. Resection and retrieval were complete.                           There was evidence of a prior end-to-side                            ileo-colonic anastomosis in the transverse colon.                            This was patent and was characterized by healthy  appearing mucosa. The anastomosis was not traversed.                           Internal hemorrhoids were found during                            retroflexion. The hemorrhoids were moderate and                            Grade I (internal hemorrhoids that do not prolapse).                           The exam was otherwise without abnormality on                            direct and retroflexion views. Complications:            No immediate complications. Estimated blood loss:                            None. Estimated Blood Loss:     Estimated blood loss: none. Impression:               - One 7 mm polyp in the transverse colon, removed                            with a cold snare. Resected and retrieved.                           - Patent end-to-side ileo-colonic anastomosis,                            characterized by healthy appearing mucosa.                           -  Internal hemorrhoids.                           - The examination was otherwise normal on direct                            and retroflexion views. Recommendation:           - Repeat colonoscopy, likely 3 years, after studies                            are complete for surveillance based on pathology                            results.                           - Patient has a contact number available for                            emergencies. The signs and symptoms of potential  delayed complications were discussed with the                            patient. Return to normal activities tomorrow.                            Written discharge instructions were provided to the                            patient.                           - Resume previous diet.                           - Continue present medications.                           - Await pathology results.                           - Schedule hemorrhoidal banding. Ladene Artist, MD 12/28/2020 2:25:20 PM This report has been signed electronically.

## 2020-12-28 NOTE — Patient Instructions (Signed)
Please read handouts provided. Continue present medications. Await pathology results. Await scheduling of Hemorrhoidal banding.   YOU HAD AN ENDOSCOPIC PROCEDURE TODAY AT [facility]:   Refer to the procedure report that was given to you for any specific questions about what was found during the examination.  If the procedure report does not answer your questions, please call your gastroenterologist to clarify.  If you requested that your care partner not be given the details of your procedure findings, then the procedure report has been included in a sealed envelope for you to review at your convenience later.  YOU SHOULD EXPECT: Some feelings of bloating in the abdomen. Passage of more gas than usual.  Walking can help get rid of the air that was put into your GI tract during the procedure and reduce the bloating. If you had a lower endoscopy (such as a colonoscopy or flexible sigmoidoscopy) you may notice spotting of blood in your stool or on the toilet paper. If you underwent a bowel prep for your procedure, you may not have a normal bowel movement for a few days.  Please Note:  You might notice some irritation and congestion in your nose or some drainage.  This is from the oxygen used during your procedure.  There is no need for concern and it should clear up in a day or so.  SYMPTOMS TO REPORT IMMEDIATELY:  Following lower endoscopy (colonoscopy or flexible sigmoidoscopy):  Excessive amounts of blood in the stool  Significant tenderness or worsening of abdominal pains  Swelling of the abdomen that is new, acute  Fever of 100F or higher   For urgent or emergent issues, a gastroenterologist can be reached at any hour by calling [phone]. Do not use MyChart messaging for urgent concerns.    DIET:  We do recommend a small meal at first, but then you may proceed to your regular diet.  Drink plenty of fluids but you should avoid alcoholic beverages for 24  hours.  ACTIVITY:  You should plan to take it easy for the rest of today and you should NOT DRIVE or use heavy machinery until tomorrow (because of the sedation medicines used during the test).    FOLLOW UP: Our staff will call the number listed on your records 48-72 hours following your procedure to check on you and address any questions or concerns that you may have regarding the information given to you following your procedure. If we do not reach you, we will leave a message.  We will attempt to reach you two times.  During this call, we will ask if you have developed any symptoms of COVID 19. If you develop any symptoms (ie: fever, flu-like symptoms, shortness of breath, cough etc.) before then, please call [phone].  If you test positive for Covid 19 in the 2 weeks post procedure, please call and report this information to [name].    If any biopsies were taken you will be contacted by phone or by letter within the next 1-3 weeks.  Please call us at [phone] if you have not heard about the biopsies in 3 weeks.    SIGNATURES/CONFIDENTIALITY: You and/or your care partner have signed paperwork which will be entered into your electronic medical record.  These signatures attest to the fact that that the information above on your After Visit Summary has been reviewed and is understood.  Full responsibility of the confidentiality of this discharge information lies with you and/or your care-partner.

## 2020-12-28 NOTE — Progress Notes (Signed)
VS by CW  Pt's states no medical or surgical changes since previsit or office visit.  

## 2020-12-28 NOTE — Progress Notes (Signed)
Called to room to assist during endoscopic procedure.  Patient ID and intended procedure confirmed with present staff. Received instructions for my participation in the procedure from the performing physician.  

## 2020-12-28 NOTE — Progress Notes (Signed)
History & Physical  Primary Care Physician:  Nolene Ebbs, MD Primary Gastroenterologist: Lucio Edward, MD  CHIEF COMPLAINT: Personal history of colon cancer, hematochezia  HPI: Gail Carlson is a 70 y.o. female with a history of stage III colon cancer in remission and intermittent small-volume hematochezia here for colonoscopy.    Past Medical History:  Diagnosis Date   Allergy    Anemia    Anxiety    Arthritis    shoulders- oxycodone prn    Colon cancer ([place])    colon cancer   2013   Constipation    uses laxative prn    Depression    Diabetes mellitus without complication ([place])    Family history of breast cancer    GERD (gastroesophageal reflux disease)    no meds   History of chemotherapy 2015   History of radiation therapy 2015   Hypertension    Insomnia    Neuromuscular disorder ([place])    hernia   Neuropathy    under the heel part of foot    Renal insufficiency     Past Surgical History:  Procedure Laterality Date   APPENDECTOMY     COLON RESECTION N/A 01/11/2018   Procedure: OPEN COLON RESECTION;  Surgeon: Armandina Gemma, MD;  Location: [facility];  Service: General;  Laterality: N/A;   COLON SURGERY     COLONOSCOPY N/A 01/10/2014   Procedure: COLONOSCOPY;  Surgeon: Irene Shipper, MD;  Location: [facility];  Service: Endoscopy;  Laterality: N/A;   COLONOSCOPY     ESOPHAGOGASTRODUODENOSCOPY N/A 01/10/2014   Procedure: ESOPHAGOGASTRODUODENOSCOPY (EGD);  Surgeon: Irene Shipper, MD;  Location: [facility];  Service: Endoscopy;  Laterality: N/A;   HERNIA REPAIR     INCISION AND DRAINAGE ABSCESS N/A 05/09/2019   Procedure: EXCISION AND DRAINAGE OF CHRONIC SEROMA;  Surgeon: Armandina Gemma, MD;  Location: [facility];  Service: General;  Laterality: N/A;   INCISIONAL HERNIA REPAIR N/A 01/11/2018   Procedure: HERNIA REPAIR INCISIONAL;  Surgeon: Armandina Gemma, MD;  Location: [facility];  Service: General;  Laterality: N/A;   LAPAROTOMY N/A 01/11/2014   Procedure: EXPLORATORY  LAPAROTOMY;  Surgeon: Armandina Gemma, MD;  Location: [facility];  Service: General;  Laterality: N/A;   PARTIAL COLECTOMY N/A 01/11/2014   Procedure: PARTIAL COLECTOMY;  Surgeon: Armandina Gemma, MD;  Location: [facility];  Service: General;  Laterality: N/A;   PORT-A-CATH REMOVAL Left 03/24/2015   Procedure: REMOVAL PORT-A-CATH;  Surgeon: Armandina Gemma, MD;  Location: [facility];  Service: General;  Laterality: Left;   PORTACATH PLACEMENT N/A 02/18/2014   Procedure: INSERTION PORT-A-CATH LEFT SUBCLAVIAN VIEN;  Surgeon: Armandina Gemma, MD;  Location: [facility];  Service: General;  Laterality: N/A;   SAVORY DILATION N/A 01/10/2014   Procedure: SAVORY DILATION;  Surgeon: Irene Shipper, MD;  [facility]: [facility];  Service: Endoscopy;  Laterality: N/A;  Please verify with Henrene Pastor what type of dilation he will use   UPPER GASTROINTESTINAL ENDOSCOPY      Prior to Admission medications   Medication Sig Start Date End Date Taking? Authorizing Provider  amLODipine (NORVASC) 10 MG tablet Take 1 tablet (10 mg total) by mouth daily. 04/17/14  Yes Owens Shark, NP  cholecalciferol (VITAMIN D3) 25 MCG (1000 UNIT) tablet Take 1,000 Units by mouth daily.   Yes [provider]  citalopram (CELEXA) 10 MG tablet Take 10 mg by mouth every evening.    Yes [provider]  famotidine (PEPCID) 40 MG tablet  Take 1 tablet (40 mg total) by mouth daily. 11/19/20  Yes Ladene Artist, MD  gabapentin (NEURONTIN) 100 MG capsule Take 100 mg by mouth at bedtime. 08/28/20  Yes [provider]  Ibuprofen-diphenhydrAMINE HCl (ADVIL PM) 200-25 MG CAPS Take 2 capsules by mouth at bedtime as needed (pain/sleep).    Yes [provider]  lisinopril-hydrochlorothiazide (PRINZIDE,ZESTORETIC) 20-12.5 MG tablet Take 1 tablet by mouth every evening.    Yes [provider]  Multiple Vitamins-Iron (MULTIVITAMINS WITH IRON) TABS tablet Take 1 tablet by mouth every other day.   Yes  [provider]  tiZANidine (ZANAFLEX) 4 MG tablet Take 4 mg by mouth daily. 03/05/20  Yes [provider]  traZODone (DESYREL) 100 MG tablet Take 100 mg by mouth every other day. 03/19/20  Yes [provider]  vitamin B-12 (CYANOCOBALAMIN) 250 MCG tablet Take 250 mcg by mouth daily.   Yes [provider]  calcium carbonate (TUMS - DOSED IN MG ELEMENTAL CALCIUM) 500 MG chewable tablet Chew 1 tablet by mouth daily.    [provider]    Current Outpatient Medications  Medication Sig Dispense Refill   amLODipine (NORVASC) 10 MG tablet Take 1 tablet (10 mg total) by mouth daily. 30 tablet 1   cholecalciferol (VITAMIN D3) 25 MCG (1000 UNIT) tablet Take 1,000 Units by mouth daily.     citalopram (CELEXA) 10 MG tablet Take 10 mg by mouth every evening.      famotidine (PEPCID) 40 MG tablet Take 1 tablet (40 mg total) by mouth daily. 30 tablet 11   gabapentin (NEURONTIN) 100 MG capsule Take 100 mg by mouth at bedtime.     Ibuprofen-diphenhydrAMINE HCl (ADVIL PM) 200-25 MG CAPS Take 2 capsules by mouth at bedtime as needed (pain/sleep).      lisinopril-hydrochlorothiazide (PRINZIDE,ZESTORETIC) 20-12.5 MG tablet Take 1 tablet by mouth every evening.   0   Multiple Vitamins-Iron (MULTIVITAMINS WITH IRON) TABS tablet Take 1 tablet by mouth every other day.     tiZANidine (ZANAFLEX) 4 MG tablet Take 4 mg by mouth daily.     traZODone (DESYREL) 100 MG tablet Take 100 mg by mouth every other day.     vitamin B-12 (CYANOCOBALAMIN) 250 MCG tablet Take 250 mcg by mouth daily.     calcium carbonate (TUMS - DOSED IN MG ELEMENTAL CALCIUM) 500 MG chewable tablet Chew 1 tablet by mouth daily.     Current Facility-Administered Medications  Medication Dose Route Frequency Provider Last Rate Last Admin   0.9 %  sodium chloride infusion  500 mL Intravenous Once Ladene Artist, MD       Facility-Administered Medications Ordered in Other Visits  Medication Dose Route  Frequency Provider Last Rate Last Admin   sodium chloride 0.9 % injection 10 mL  10 mL Intravenous PRN Ladell Pier, MD        Allergies as of 12/28/2020   (No Known Allergies)    Family History  Problem Relation Age of Onset   Breast cancer Mother        Dx 71s; deceased 72   Diabetes Mother    Diabetes Brother    Uterine cancer Maternal Aunt        Dx 76s; deceased 20s   Breast cancer Cousin        daughter of mat aunt w/ uterine ca   Breast cancer Cousin        daughter of mat aunt w/ uterine ca   Breast cancer  Cousin        daughter of mat aunt w/ uterine ca   Colon cancer Neg Hx    Colon polyps Neg Hx    Rectal cancer Neg Hx    Stomach cancer Neg Hx    Esophageal cancer Neg Hx     Social History   Socioeconomic History   Marital status: Widowed    Spouse name: Not on file   Number of children: 2   Years of education: Not on file   Highest education level: Not on file  Occupational History   Occupation: Retired  Tobacco Use   Smoking status: Some Days    Packs/day: 0.25    Types: Cigarettes   Smokeless tobacco: Never   Tobacco comments:    1 pack will last > 1 week   Vaping Use   Vaping Use: Never used  Substance and Sexual Activity   Alcohol use: Yes    Alcohol/week: 5.0 standard drinks    Types: 2 Cans of beer, 3 Shots of liquor per week    Comment: occasional once a month   Drug use: Yes    Types: Marijuana    Comment: occasional   Sexual activity: Not Currently  Other Topics Concern   Not on file  Social History Narrative   Not on file   Social Determinants of Health   Financial Resource Strain: Not on file  Food Insecurity: Not on file  Transportation Needs: Not on file  Physical Activity: Not on file  Stress: Not on file  Social Connections: Not on file  Intimate Partner Violence: Not on file    Review of Systems:  All systems reviewed an negative except where noted in HPI.  Gen: Denies any fever, chills, sweats, anorexia,  fatigue, weakness, malaise, weight loss, and sleep disorder CV: Denies chest pain, angina, palpitations, syncope, orthopnea, PND, peripheral edema, and claudication. Resp: Denies dyspnea at rest, dyspnea with exercise, cough, sputum, wheezing, coughing up blood, and pleurisy. GI: Denies vomiting blood, jaundice, and fecal incontinence.   Denies dysphagia or odynophagia. GU : Denies urinary burning, blood in urine, urinary frequency, urinary hesitancy, nocturnal urination, and urinary incontinence. MS: Denies joint pain, limitation of movement, and swelling, stiffness, low back pain, extremity pain. Denies muscle weakness, cramps, atrophy.  Derm: Denies rash, itching, dry skin, hives, moles, warts, or unhealing ulcers.  Psych: Denies depression, anxiety, memory loss, suicidal ideation, hallucinations, paranoia, and confusion. Heme: Denies bruising, bleeding, and enlarged lymph nodes. Neuro:  Denies any headaches, dizziness, paresthesias. Endo:  Denies any problems with DM, thyroid, adrenal function.   Physical Exam: General:  Alert, well-developed, in NAD Head:  Normocephalic and atraumatic. Eyes:  Sclera clear, no icterus.   Conjunctiva pink. Ears:  Normal auditory acuity. Mouth:  No deformity or lesions.  Neck:  Supple; no masses . Lungs:  Clear throughout to auscultation.   No wheezes, crackles, or rhonchi. No acute distress. Heart:  Regular rate and rhythm; no murmurs. Abdomen:  Soft, nondistended, nontender. No masses, hepatomegaly. No obvious masses.  Normal bowel .    Rectal:  Deferred   Msk:  Symmetrical without gross deformities.. Pulses:  Normal pulses noted. Extremities:  Without edema. Neurologic:  Alert and  oriented x4;  grossly normal neurologically. Skin:  Intact without significant lesions or rashes. Cervical Nodes:  No significant cervical adenopathy. Psych:  Alert and cooperative. Normal mood and affect.   Impression / Plan:   Personal history of stage III colon  cancer in remission and  intermittent rectal bleeding for colonoscopy.    Pricilla Riffle. Fuller Plan  12/28/2020, 2:01 PM See Shea Evans, [facility], to contact our on call provider

## 2020-12-30 ENCOUNTER — Telehealth: Payer: Self-pay

## 2020-12-30 NOTE — Telephone Encounter (Signed)
  Follow up Call-  Call back number 12/28/2020 10/30/2018  Post procedure Call Back phone  # [phone]  Permission to leave phone message Yes Yes  Some recent data might be hidden     Patient questions:  Do you have a fever, pain , or abdominal swelling? No. Pain Score  0 *  Have you tolerated food without any problems? Yes.    Have you been able to return to your normal activities? Yes.    Do you have any questions about your discharge instructions: Diet   No. Medications  No. Follow up visit  No.  Do you have questions or concerns about your Care? No.  Actions: * If pain score is 4 or above: No action needed, pain <4.  Have you developed a fever since your procedure? no  2.   Have you had an respiratory symptoms (SOB or cough) since your procedure? no  3.   Have you tested positive for COVID 19 since your procedure no  4.   Have you had any family members/close contacts diagnosed with the COVID 19 since your procedure?  no   If yes to any of these questions please route to Joylene John, RN and Joella Prince, RN

## 2021-01-04 ENCOUNTER — Encounter: Payer: Self-pay | Admitting: Gastroenterology

## 2021-02-15 ENCOUNTER — Ambulatory Visit (INDEPENDENT_AMBULATORY_CARE_PROVIDER_SITE_OTHER): Payer: Medicare Other | Admitting: Gastroenterology

## 2021-02-15 ENCOUNTER — Encounter: Payer: Self-pay | Admitting: Gastroenterology

## 2021-02-15 VITALS — BP 160/80 | HR 80 | Ht 63.0 in | Wt 170.0 lb

## 2021-02-15 DIAGNOSIS — K640 First degree hemorrhoids: Secondary | ICD-10-CM

## 2021-02-15 DIAGNOSIS — K648 Other hemorrhoids: Secondary | ICD-10-CM | POA: Diagnosis not present

## 2021-02-15 NOTE — Patient Instructions (Signed)
HEMORRHOID BANDING PROCEDURE    FOLLOW-UP CARE   The procedure you have had should have been relatively painless since the banding of the area involved does not have nerve endings and there is no pain sensation.  The rubber band cuts off the blood supply to the hemorrhoid and the band may fall off as soon as 48 hours after the banding (the band may occasionally be seen in the toilet bowl following a bowel movement). You may notice a temporary feeling of fullness in the rectum which should respond adequately to plain Tylenol or Motrin.  Following the banding, avoid strenuous exercise that evening and resume full activity the next day.  A sitz bath (soaking in a warm tub) or bidet is soothing, and can be useful for cleansing the area after bowel movements.     To avoid constipation, take two tablespoons of natural wheat bran, natural oat bran, flax, Benefiber or any over the counter fiber supplement and increase your water intake to 7-8 glasses daily.    Unless you have been prescribed anorectal medication, do not put anything inside your rectum for two weeks: No suppositories, enemas, fingers, etc.  Occasionally, you may have more bleeding than usual after the banding procedure.  This is often from the untreated hemorrhoids rather than the treated one.  Don't be concerned if there is a tablespoon or so of blood.  If there is more blood than this, lie flat with your bottom higher than your head and apply an ice pack to the area. If the bleeding does not stop within a half an hour or if you feel faint, call our office at [phone] or go to the emergency room.  Problems are not common; however, if there is a substantial amount of bleeding, severe pain, chills, fever or difficulty passing urine (very rare) or other problems, you should call us at [phone] or report to the nearest emergency room.  Do not stay seated continuously for more than 2-3 hours for a day or two after the procedure.   Tighten your buttock muscles 10-15 times every two hours and take 10-15 deep breaths every 1-2 hours.  Do not spend more than a few minutes on the toilet if you cannot empty your bowel; instead re-visit the toilet at a later time.   Thank you for choosing me and [facility].  Malcolm T. Stark, Jr., MD., FACG  

## 2021-02-15 NOTE — Progress Notes (Signed)
PROCEDURE NOTE: The patient presents with symptomatic bleeding grade I hemorrhoids, requesting rubber band ligation of their hemorrhoidal disease.  All risks, benefits and alternative forms of therapy were described and informed consent was obtained.  The anorectum was pre-medicated during digital exam with 0.125% NTG and 5% lidocaine. In the Left Lateral Decubitus position anoscopic examination revealed grade I hemorrhoids in the RA > RP > LL positions.   The decision was made to band the RA internal hemorrhoid, and the [place] was used to perform band ligation without complication.  Digital anorectal examination was then performed to assure proper positioning of the band and to adjust the banded tissue as required. No adjustment was needed.  No complications were encountered and the patient tolerated the procedure well.  Dietary and behavioral recommendations were given and along with follow-up instructions.   Return for possible additional hemorrhoid banding in 2 - 3 weeks as required.  High fiber diet, Benefiber daily and at least 8 glasses of water daily. The patient was discharged home without pain or other post procedure problems.

## 2021-03-12 ENCOUNTER — Ambulatory Visit (INDEPENDENT_AMBULATORY_CARE_PROVIDER_SITE_OTHER): Payer: Medicare Other | Admitting: Gastroenterology

## 2021-03-12 ENCOUNTER — Encounter: Payer: Self-pay | Admitting: Gastroenterology

## 2021-03-12 VITALS — BP 144/48 | HR 56 | Ht 63.0 in | Wt 172.4 lb

## 2021-03-12 DIAGNOSIS — K640 First degree hemorrhoids: Secondary | ICD-10-CM

## 2021-03-12 DIAGNOSIS — K648 Other hemorrhoids: Secondary | ICD-10-CM | POA: Diagnosis not present

## 2021-03-12 NOTE — Patient Instructions (Signed)

## 2021-03-12 NOTE — Progress Notes (Signed)
PROCEDURE NOTE: The patient presents with symptomatic bleeding grade I hemorrhoids, requesting rubber band ligation of their hemorrhoidal disease.  All risks, benefits and alternative forms of therapy were described and informed consent was obtained.  RA hemorrhoid banded on 02/15/2021. Only minimal bleeding since RA banded.   The anorectum was pre-medicated during digital exam with 0.125% NTG and 5% lidocaine. Small external skin tags noted.   The decision was made to band the LL internal hemorrhoid, and the [place] was used to perform band ligation without complication. Long anal canal and recessed post rectal wall made the RP challenging to reach.  Digital anorectal examination was then performed to assure proper positioning of the band and to adjust the banded tissue as required. No adjustment was needed.  No complications were encountered and the patient tolerated the procedure well.  Dietary and behavioral recommendations were given and along with follow-up instructions.   Return for possible additional hemorrhoid banding in 2 - 3 weeks as required.  High fiber diet, Benefiber daily and at least 8 glasses of water daily. The patient was discharged home without pain or other post procedure problems.

## 2021-03-29 ENCOUNTER — Other Ambulatory Visit: Payer: Self-pay

## 2021-03-29 ENCOUNTER — Inpatient Hospital Stay: Payer: Medicare Other | Attending: Oncology

## 2021-03-29 ENCOUNTER — Inpatient Hospital Stay (HOSPITAL_BASED_OUTPATIENT_CLINIC_OR_DEPARTMENT_OTHER): Payer: Medicare Other | Admitting: Oncology

## 2021-03-29 VITALS — BP 160/90 | HR 72 | Temp 97.8°F | Resp 20 | Ht 63.0 in | Wt 170.0 lb

## 2021-03-29 DIAGNOSIS — Z803 Family history of malignant neoplasm of breast: Secondary | ICD-10-CM | POA: Insufficient documentation

## 2021-03-29 DIAGNOSIS — Z85038 Personal history of other malignant neoplasm of large intestine: Secondary | ICD-10-CM | POA: Diagnosis not present

## 2021-03-29 DIAGNOSIS — I10 Essential (primary) hypertension: Secondary | ICD-10-CM | POA: Insufficient documentation

## 2021-03-29 DIAGNOSIS — C189 Malignant neoplasm of colon, unspecified: Secondary | ICD-10-CM

## 2021-03-29 LAB — CEA (ACCESS): CEA (CHCC): 3.5 ng/mL (ref 0.00–5.00)

## 2021-03-29 NOTE — Progress Notes (Signed)
[facility] OFFICE PROGRESS NOTE   Diagnosis: Colon cancer  INTERVAL HISTORY:   [name] returns as scheduled.  She feels well.  She has noted discomfort at the left upper arm for the past week.  The discomfort is relieved with a cream.  She has undergone hemorrhoid banding by Dr. Fuller Plan for treatment of bleeding hemorrhoids.  Objective:  Vital signs in last 24 hours:  Blood pressure (!) 160/90, pulse 72, temperature 97.8 F (36.6 C), temperature source Oral, resp. rate 20, height 5' 3" (1.6 m), weight 170 lb (77.1 kg), SpO2 100 %.     Lymphatics: No cervical, supraclavicular, axillary, or inguinal nodes Resp: Lungs clear bilaterally Cardio: Regular rate and rhythm GI: No hepatosplenomegaly, no mass, nontender Vascular: No leg edema Musculoskeletal: No mass at the left upper arm.  Mild tenderness in the muscle/soft tissue, no pain with motion of the left shoulder   Lab Results:  Lab Results  Component Value Date   WBC 6.1 04/30/2019   HGB 12.5 04/30/2019   HCT 41.5 04/30/2019   MCV 89.6 04/30/2019   PLT 269 04/30/2019   NEUTROABS 3.3 11/15/2017    CMP  Lab Results  Component Value Date   NA 141 09/23/2020   K 4.0 09/23/2020   CL 106 09/23/2020   CO2 28 09/23/2020   GLUCOSE 96 09/23/2020   BUN 12 09/23/2020   CREATININE 1.30 (H) 09/23/2020   CALCIUM 9.3 09/23/2020   PROT 7.1 11/15/2017   ALBUMIN 4.1 11/15/2017   AST 12 11/15/2017   ALT 8 11/15/2017   ALKPHOS 79 11/15/2017   BILITOT 0.5 11/15/2017   GFRNONAA 44 (L) 09/23/2020   GFRAA 43 (L) 10/01/2019    Lab Results  Component Value Date   CEA1 3.59 09/23/2020   CEA 3.50 03/29/2021      Medications: I have reviewed the patient's current medications.   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 CTs of  the chest, abdomen, and pelvis on 01/10/2014 with a cecal mass, nonspecific small liver lesions, and nonspecific lung nodules felt to most likely represent mucoid impaction Cycle 1 FOLFOX 02/20/2014 Cycle 2 FOLFOX 03/06/2014 Cycle 3 FOLFOX 03/20/2014 Cycle 4 FOLFOX 04/03/2014 Cycle 5 FOLFOX held on 04/17/2014 due to neutropenia Cycle 5 FOLFOX 04/24/2014 with Neulasta support Cycle 6 FOLFOX 05/08/2014 Cycle 7 FOLFOX 05/22/2014 with Neulasta support Cycle 8 FOLFOX 06/05/2014 (oxaliplatin held secondary to thrombocytopenia) Cycle 9 FOLFOX 06/19/2014 with Neulasta support Cycle 10 FOLFOX 07/03/2014 (oxaliplatin held secondary to thrombocytopenia) Cycle 11 FOLFOX 07/17/2014 Cycle 12 FOLFOX 07/31/2014 (oxaliplatin dose reduced due to thrombocytopenia CTs chest, abdomen, and pelvis on 01/05/2015-no evidence of recurrent disease Colonoscopy 09/15/2015-tubular adenoma removed from the transverse colon CTs chest, abdomen, pelvis on 01/14/2016-no evidence of recurrent disease CTs 01/12/2017-no evidence of recurrent cancer. Colonoscopy 10/25/2017-1 cm deep firm ulceration on the colonic side distal aspect of the anastomosis.  Pathology-adenocarcinoma CEA 11/15/2017-2.2 CT abdomen/pelvis 11/16/2017- no evidence of recurrent or metastatic carcinoma.  Stable small hiatal hernia and periumbilical ventral hernia.  Stable benign bilateral adrenal lesions. Resection of anastomosis 01/11/2018-adenocarcinoma, recurrent, moderately differentiated (2 cm) involving the prior anastomotic site; tumor invades muscularis propria; no carcinoma identified in 7 lymph nodes.  Margins negative. CTs 08/14/2018-masslike collection of fluid in the subcutaneous tissue of the umbilicus Ultrasound-guided aspiration of anterior abdominal wall complex fluid collection 09/07/2018, negative cytology Colonoscopy 10/30/2018-superficial  ulceration at the ileocolonic anastomosis, biopsy negative for malignancy; repeat colonoscopy in 2 years  for surveillance CTs 09/24/2019-recurrent collection at the site of the previous subcutaneous fluid collection CTs 09/23/2020-resolution of seroma at the anterior abdominal wall, no evidence of recurrent disease    History of Iron deficiency anemia Family history of breast cancer Port-A-Cath placement 02/18/2014 Delayed nausea following cycle 1 FOLFOX. Aloxi and Emend added beginning with cycle 2. Prophylactic Decadron added beginning with cycle 3. Neutropenia following cycle 4 FOLFOX. Neulasta was added with cycle 5. Hypokalemia 04/17/2014. Question secondary to diarrhea. Potassium supplement initiated. Left upper arm pain. Negative venous Doppler 03/07/2014. She was referred to orthopedics. 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  Pathology on seroma cavity, abdominal wall excision-benign fibrous tissue with adherent fibrinous debris.         Disposition: Gail Carlson remains in clinical remission from colon cancer.  The CEA is normal.  She will return for an office visit, CEA, and restaging CTs in 6 months.  She will continue treatment of the hemorrhoids with Dr. Fuller Plan.  She will follow-up with Dr. Jeanie Cooks if the left arm pain does not resolve.  [name], MD  03/29/2021  9:50 AM

## 2021-03-30 ENCOUNTER — Encounter: Payer: Medicare Other | Admitting: Gastroenterology

## 2021-04-14 ENCOUNTER — Encounter: Payer: Self-pay | Admitting: Gastroenterology

## 2021-04-14 ENCOUNTER — Ambulatory Visit: Payer: Medicare Other | Admitting: Gastroenterology

## 2021-04-14 VITALS — BP 154/88 | HR 76 | Ht 63.0 in | Wt 168.0 lb

## 2021-04-14 DIAGNOSIS — K648 Other hemorrhoids: Secondary | ICD-10-CM

## 2021-04-14 NOTE — Progress Notes (Signed)
She returns to discuss and consider banding #3.  She states she has infrequent minimal trace amounts of blood on the tissue paper after wiping.  This is a major improvement from her prebanding symptoms.  She feels that first 2 bandings were very effective.  Attempts to band the RP hemorrhoid were not successful at her last banding.  We discussed the options of another try to band the RP hemorrhoid versus discontinuing banding as her symptoms have improved substantially.  I recommended the latter option and she agreed.  She will contact us if her hemorrhoid symptoms worsen. ?

## 2021-04-14 NOTE — Patient Instructions (Signed)
Call our office as needed.  ? ?Thank you for choosing me and [facility]. ? ?Malcolm T. Dagoberto Ligas., MD., [facility] ? ?

## 2021-07-26 ENCOUNTER — Other Ambulatory Visit: Payer: Self-pay | Admitting: Internal Medicine

## 2021-07-26 DIAGNOSIS — Z1231 Encounter for screening mammogram for malignant neoplasm of breast: Secondary | ICD-10-CM

## 2021-09-08 ENCOUNTER — Ambulatory Visit: Payer: Medicare Other

## 2021-09-23 ENCOUNTER — Ambulatory Visit
Admission: RE | Admit: 2021-09-23 | Discharge: 2021-09-23 | Disposition: A | Discharge status: Home / Self Care | Payer: Medicare Other | Source: Ambulatory Visit | Attending: Internal Medicine | Admitting: Internal Medicine

## 2021-09-23 DIAGNOSIS — Z1231 Encounter for screening mammogram for malignant neoplasm of breast: Secondary | ICD-10-CM

## 2021-09-24 ENCOUNTER — Telehealth: Payer: Self-pay

## 2021-09-24 NOTE — Telephone Encounter (Signed)
Patient called in for instruction on how to take her contrast. I advised the patient Npo 4 hours prior to her appointment, 1st bottle at 8 and the next bottle at 9. Patient gave verbal understanding.

## 2021-09-27 ENCOUNTER — Inpatient Hospital Stay: Payer: Medicare Other | Attending: Oncology

## 2021-09-27 ENCOUNTER — Encounter (HOSPITAL_BASED_OUTPATIENT_CLINIC_OR_DEPARTMENT_OTHER): Payer: Self-pay

## 2021-09-27 ENCOUNTER — Ambulatory Visit (HOSPITAL_BASED_OUTPATIENT_CLINIC_OR_DEPARTMENT_OTHER)
Admission: RE | Admit: 2021-09-27 | Discharge: 2021-09-27 | Disposition: A | Discharge status: Home / Self Care | Payer: Medicare Other | Source: Ambulatory Visit | Attending: Oncology | Admitting: Oncology

## 2021-09-27 DIAGNOSIS — Z85038 Personal history of other malignant neoplasm of large intestine: Secondary | ICD-10-CM | POA: Insufficient documentation

## 2021-09-27 DIAGNOSIS — C189 Malignant neoplasm of colon, unspecified: Secondary | ICD-10-CM | POA: Insufficient documentation

## 2021-09-27 LAB — BASIC METABOLIC PANEL - CANCER CENTER ONLY
Anion gap: 8 (ref 5–15)
BUN: 24 mg/dL — ABNORMAL HIGH (ref 8–23)
CO2: 26 mmol/L (ref 22–32)
Calcium: 9.5 mg/dL (ref 8.9–10.3)
Chloride: 105 mmol/L (ref 98–111)
Creatinine: 1.49 mg/dL — ABNORMAL HIGH (ref 0.44–1.00)
GFR, Estimated: 37 mL/min — ABNORMAL LOW (ref >60)
Glucose, Bld: 98 mg/dL (ref 70–99)
Potassium: 3.9 mmol/L (ref 3.5–5.1)
Sodium: 139 mmol/L (ref 135–145)

## 2021-09-27 LAB — CEA (ACCESS): CEA (CHCC): 3.33 ng/mL (ref 0.00–5.00)

## 2021-09-27 MED ORDER — IOHEXOL 300 MG/ML  SOLN
100.0000 mL | Freq: Once | INTRAMUSCULAR | Status: AC | PRN
  Administered 2021-09-27: 70 mL via INTRAVENOUS

## 2021-09-29 ENCOUNTER — Encounter: Payer: Self-pay | Admitting: Nurse Practitioner

## 2021-09-29 ENCOUNTER — Inpatient Hospital Stay (HOSPITAL_BASED_OUTPATIENT_CLINIC_OR_DEPARTMENT_OTHER): Payer: Medicare Other | Admitting: Nurse Practitioner

## 2021-09-29 ENCOUNTER — Telehealth: Payer: Self-pay | Admitting: Nurse Practitioner

## 2021-09-29 VITALS — BP 165/81 | HR 60 | Temp 98.1°F | Resp 20 | Ht 63.0 in | Wt 167.8 lb

## 2021-09-29 DIAGNOSIS — C189 Malignant neoplasm of colon, unspecified: Secondary | ICD-10-CM

## 2021-09-29 DIAGNOSIS — Z85038 Personal history of other malignant neoplasm of large intestine: Secondary | ICD-10-CM | POA: Diagnosis not present

## 2021-09-29 NOTE — Telephone Encounter (Signed)
Contacted patient to scheduled appointments. Patient is aware of appointments that are scheduled.   

## 2021-09-29 NOTE — Progress Notes (Signed)
[place] OFFICE PROGRESS NOTE   Diagnosis: Colon cancer  INTERVAL HISTORY:   [name] returns as scheduled.  She feels well.  No change in bowel habits.  She occasionally notes blood on the toilet tissue after a bowel movement.  She thinks this may be related to hemorrhoids.  She has a good appetite.  Her weight is stable.  She reports receiving additional IV fluids at time of recent CT scan due to kidney function.  Objective:  Vital signs in last 24 hours:  Blood pressure (!) 165/81, pulse 60, temperature 98.1 F (36.7 C), temperature source Oral, resp. rate 20, height '5\' 3"'  (1.6 m), weight 167 lb 12.8 oz (76.1 kg), SpO2 100 %.    Lymphatics: No palpable cervical, supraclavicular, axillary or inguinal lymph nodes. Resp: Lungs clear bilaterally. Cardio: Regular rate and rhythm. GI: Abdomen soft and nontender.  No hepatomegaly.  No mass. Vascular: No leg edema.   Lab Results:  Lab Results  Component Value Date   WBC 6.1 04/30/2019   HGB 12.5 04/30/2019   HCT 41.5 04/30/2019   MCV 89.6 04/30/2019   PLT 269 04/30/2019   NEUTROABS 3.3 11/15/2017    Imaging:  CT CHEST ABDOMEN PELVIS W CONTRAST  Result Date: 09/27/2021 CLINICAL DATA:  Follow-up colon carcinoma.  Surveillance. * Tracking Code: BO * EXAM: CT CHEST, ABDOMEN, AND PELVIS WITH CONTRAST TECHNIQUE: Multidetector CT imaging of the chest, abdomen and pelvis was performed following the standard protocol during bolus administration of intravenous contrast. RADIATION DOSE REDUCTION: This exam was performed according to the departmental dose-optimization program which includes automated exposure control, adjustment of the mA and/or kV according to patient size and/or use of iterative reconstruction technique. CONTRAST:  73m OMNIPAQUE IOHEXOL 300 MG/ML  SOLN COMPARISON:  09/23/2020 FINDINGS: CT CHEST FINDINGS Cardiovascular: No acute findings. Aortic and coronary atherosclerotic calcification incidentally  noted. Mediastinum/Lymph Nodes: No masses or pathologically enlarged lymph nodes identified. Lungs/Pleura: No suspicious pulmonary nodules or masses identified. No evidence of infiltrate or pleural effusion. Musculoskeletal:  No suspicious bone lesions identified. CT ABDOMEN AND PELVIS FINDINGS Hepatobiliary: No masses identified. Stable tiny cyst in posterior right hepatic lobe. Gallbladder is unremarkable. No evidence of biliary ductal dilatation. Pancreas:  No mass or inflammatory changes. Spleen:  Within normal limits in size and appearance. Adrenals/Urinary tract: Stable diffuse nodular enlargement of both adrenal glands, consistent with adrenal hyperplasia. Stable mild bilateral renal parenchymal scarring. No evidence of renal masses or hydronephrosis. Stomach/Bowel: Stable small hiatal hernia. Prior right colectomy again noted. No masses identified. No evidence of obstruction, inflammatory process, or abnormal fluid collections. Vascular/Lymphatic: No pathologically enlarged lymph nodes identified. No acute vascular findings. Aortic atherosclerotic calcification incidentally noted. Reproductive:  No mass or other significant abnormality identified. Other:  None. Musculoskeletal:  No suspicious bone lesions identified. IMPRESSION: Stable exam. No evidence of recurrent or metastatic carcinoma within the chest, abdomen, or pelvis. Stable bilateral adrenal hyperplasia. Stable small hiatal hernia. Aortic Atherosclerosis (ICD10-I70.0). Electronically Signed   By: JMarlaine HindM.D.   On: 09/27/2021 13:44    Medications: I have reviewed the patient's current medications.  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 CTs of the chest, abdomen, and pelvis on 01/10/2014 with a cecal mass, nonspecific small liver lesions, and nonspecific  lung nodules felt to most likely represent mucoid impaction Cycle 1 FOLFOX 02/20/2014 Cycle 2 FOLFOX  03/06/2014 Cycle 3 FOLFOX 03/20/2014 Cycle 4 FOLFOX 04/03/2014 Cycle 5 FOLFOX held on 04/17/2014 due to neutropenia Cycle 5 FOLFOX 04/24/2014 with Neulasta support Cycle 6 FOLFOX 05/08/2014 Cycle 7 FOLFOX 05/22/2014 with Neulasta support Cycle 8 FOLFOX 06/05/2014 (oxaliplatin held secondary to thrombocytopenia) Cycle 9 FOLFOX 06/19/2014 with Neulasta support Cycle 10 FOLFOX 07/03/2014 (oxaliplatin held secondary to thrombocytopenia) Cycle 11 FOLFOX 07/17/2014 Cycle 12 FOLFOX 07/31/2014 (oxaliplatin dose reduced due to thrombocytopenia CTs chest, abdomen, and pelvis on 01/05/2015-no evidence of recurrent disease Colonoscopy 09/15/2015-tubular adenoma removed from the transverse colon CTs chest, abdomen, pelvis on 01/14/2016-no evidence of recurrent disease CTs 01/12/2017-no evidence of recurrent cancer. Colonoscopy 10/25/2017-1 cm deep firm ulceration on the colonic side distal aspect of the anastomosis.  Pathology-adenocarcinoma CEA 11/15/2017-2.2 CT abdomen/pelvis 11/16/2017- no evidence of recurrent or metastatic carcinoma.  Stable small hiatal hernia and periumbilical ventral hernia.  Stable benign bilateral adrenal lesions. Resection of anastomosis 01/11/2018-adenocarcinoma, recurrent, moderately differentiated (2 cm) involving the prior anastomotic site; tumor invades muscularis propria; no carcinoma identified in 7 lymph nodes.  Margins negative. CTs 08/14/2018-masslike collection of fluid in the subcutaneous tissue of the umbilicus Ultrasound-guided aspiration of anterior abdominal wall complex fluid collection 09/07/2018, negative cytology Colonoscopy 10/30/2018-superficial ulceration at the ileocolonic anastomosis, biopsy negative for malignancy; repeat colonoscopy in 2 years for surveillance CTs 09/24/2019-recurrent collection at the site of the previous subcutaneous fluid collection CTs  09/23/2020-resolution of seroma at the anterior abdominal wall, no evidence of recurrent disease CTs 09/27/2021-no evidence of recurrent or metastatic disease    History of Iron deficiency anemia Family history of breast cancer Port-A-Cath placement 02/18/2014 Delayed nausea following cycle 1 FOLFOX. Aloxi and Emend added beginning with cycle 2. Prophylactic Decadron added beginning with cycle 3. Neutropenia following cycle 4 FOLFOX. Neulasta was added with cycle 5. Hypokalemia 04/17/2014. Question secondary to diarrhea. Potassium supplement initiated. Left upper arm pain. Negative venous Doppler 03/07/2014. She was referred to orthopedics. 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  Pathology on seroma cavity, abdominal wall excision-benign fibrous tissue with adherent fibrinous debris.    Disposition: [name] appears stable.  She remains in remission from colon cancer.  Recent CT scans with no evidence of recurrent/metastatic disease.  CEA is stable in normal range.  She will continue colonoscopy surveillance with Dr. Fuller Plan.  Creatinine elevated at 1.49 (stable compared to other values over past several years) on day of CT scan 09/27/2021.  She will return for a follow-up basic metabolic panel next week.  At today's visit she reports her mother was diagnosed with breast cancer in her 21s and 2 first cousins on her mother side of the family had breast cancer in their 32s.  We made a referral to the genetics counselor.  She will return for CEA and follow-up visit in 6 months.  We are available to see her sooner if needed.  Ned Card ANP/GNP-BC   09/29/2021  10:11 AM

## 2021-10-07 ENCOUNTER — Other Ambulatory Visit: Payer: Medicare Other

## 2021-10-11 ENCOUNTER — Inpatient Hospital Stay: Payer: Medicare Other | Attending: Oncology

## 2021-10-11 DIAGNOSIS — Z862 Personal history of diseases of the blood and blood-forming organs and certain disorders involving the immune mechanism: Secondary | ICD-10-CM | POA: Diagnosis not present

## 2021-10-11 DIAGNOSIS — Z85038 Personal history of other malignant neoplasm of large intestine: Secondary | ICD-10-CM | POA: Diagnosis not present

## 2021-10-11 DIAGNOSIS — C189 Malignant neoplasm of colon, unspecified: Secondary | ICD-10-CM

## 2021-10-11 LAB — BASIC METABOLIC PANEL - CANCER CENTER ONLY
Anion gap: 6 (ref 5–15)
BUN: 22 mg/dL (ref 8–23)
CO2: 29 mmol/L (ref 22–32)
Calcium: 9.4 mg/dL (ref 8.9–10.3)
Chloride: 106 mmol/L (ref 98–111)
Creatinine: 1.54 mg/dL — ABNORMAL HIGH (ref 0.44–1.00)
GFR, Estimated: 36 mL/min — ABNORMAL LOW (ref >60)
Glucose, Bld: 78 mg/dL (ref 70–99)
Potassium: 3.9 mmol/L (ref 3.5–5.1)
Sodium: 141 mmol/L (ref 135–145)

## 2021-10-12 ENCOUNTER — Telehealth: Payer: Self-pay

## 2021-10-12 NOTE — Telephone Encounter (Signed)
Patient gave verbal understanding and had no further questions or concerns  

## 2021-10-12 NOTE — Telephone Encounter (Signed)
-----   Message from Owens Shark, NP sent at 10/12/2021  2:48 PM EDT ----- Please let her know creatinine is stable.  She should follow-up with PCP regarding abnormal kidney function.

## 2021-10-22 IMAGING — CT CT CHEST-ABD-PELV W/ CM
2 of 5 series · 14 of 46 positions shown, 16 images · IV contrast (APPLIED)
Comparison: [DATE] [DATE], [DATE], [DATE] [DATE], [DATE].

CLINICAL DATA: Colorectal cancer surveillance in a 69-year-old
female.

EXAM:
CT CHEST, ABDOMEN, AND PELVIS WITH CONTRAST
TECHNIQUE: Multidetector CT imaging of the chest, abdomen and pelvis was
performed following the standard protocol during bolus
administration of intravenous contrast.
CONTRAST:  60mL OMNIPAQUE IOHEXOL 350 MG/ML SOLN

[Series 2: cap with · axial · 0.78mm/px · z∈[+979,+1474]mm · 11 of 118 slices shown, 13 images]
[im 10/118  soft-tissue]
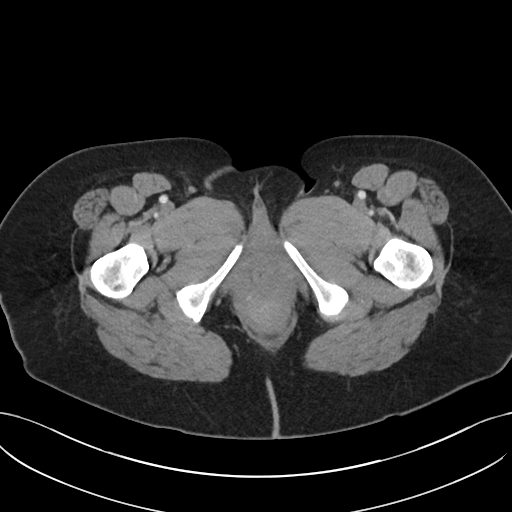
[im 10/118  bone]
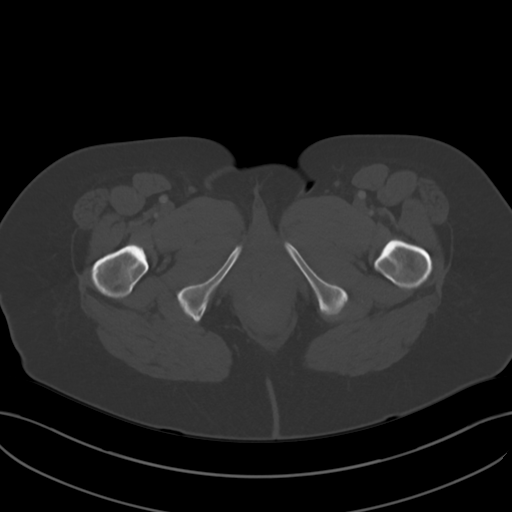
[im 19/118  soft-tissue]
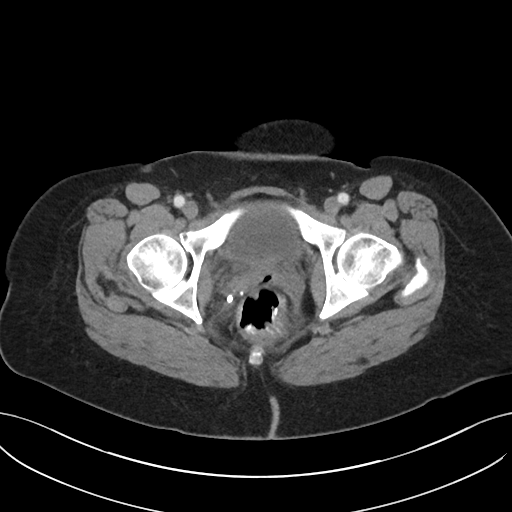
[im 28/118  soft-tissue]
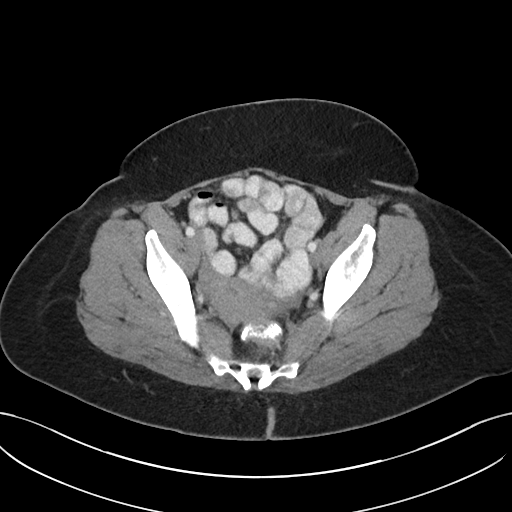
[im 37/118  soft-tissue]
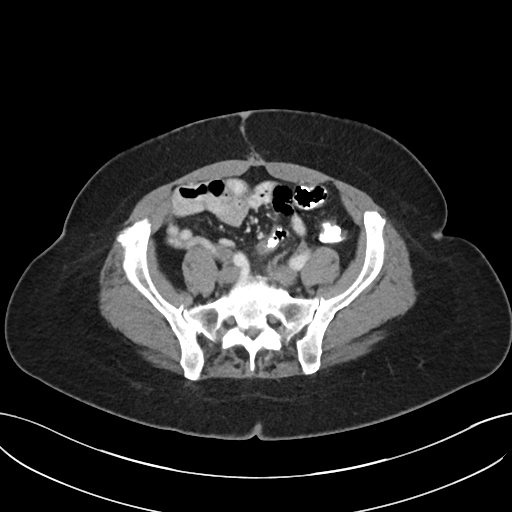
[im 46/118  soft-tissue]
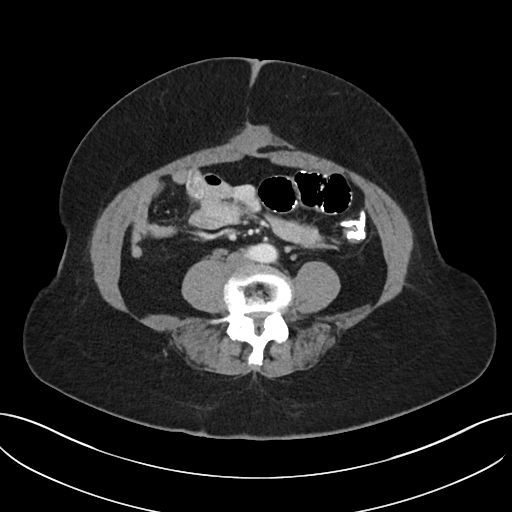
[im 64/118  soft-tissue]
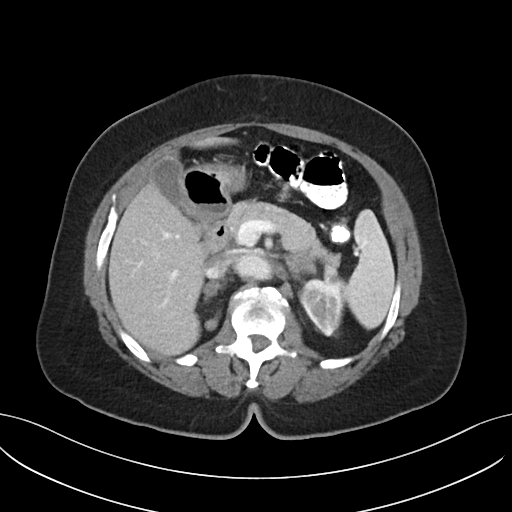
[im 73/118  soft-tissue]
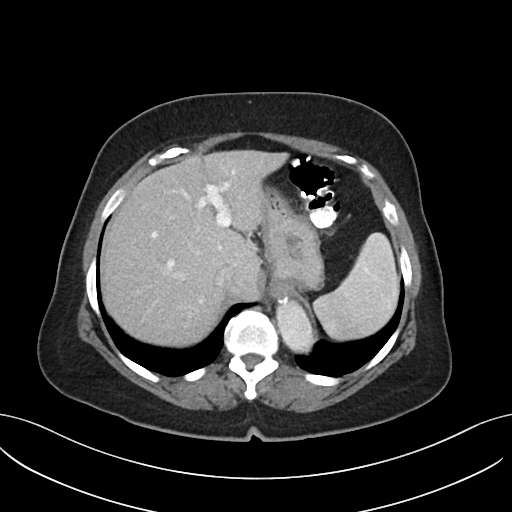
[im 82/118  soft-tissue]
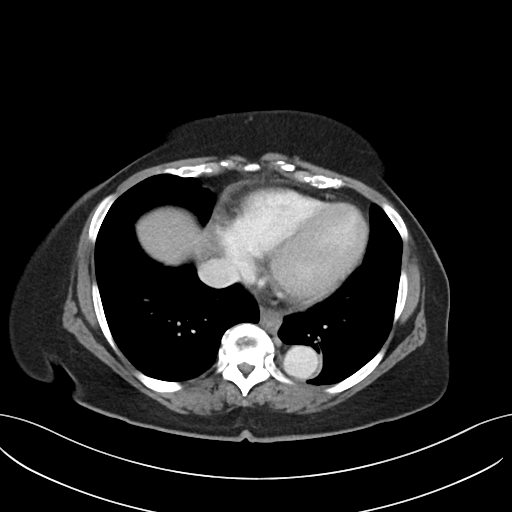
[im 91/118  soft-tissue]
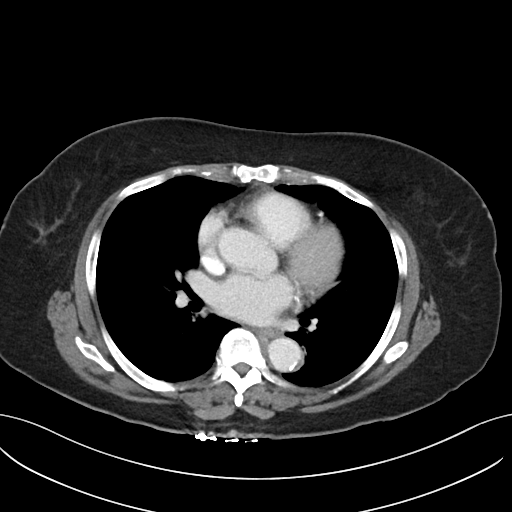
[im 91/118  bone]
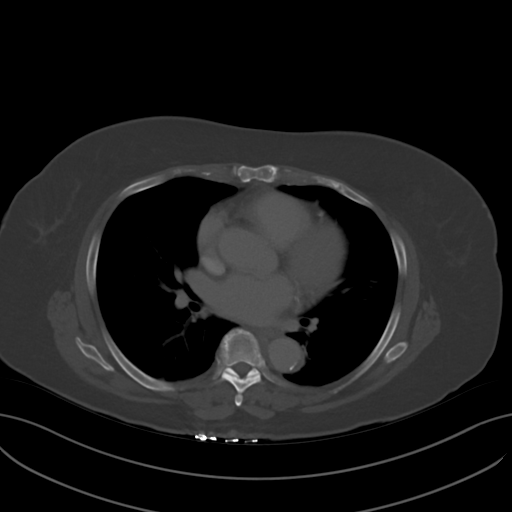
[im 100/118  soft-tissue]
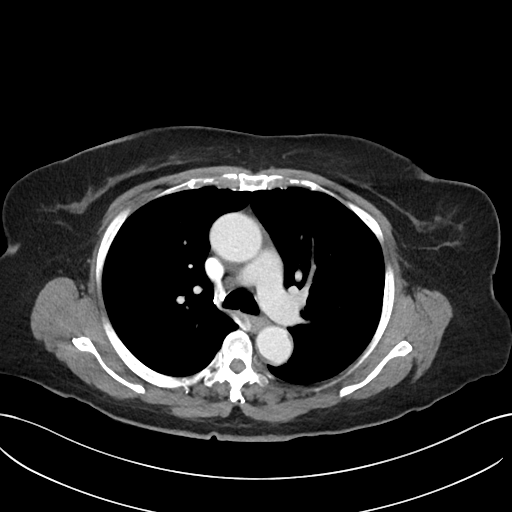
[im 109/118  soft-tissue]
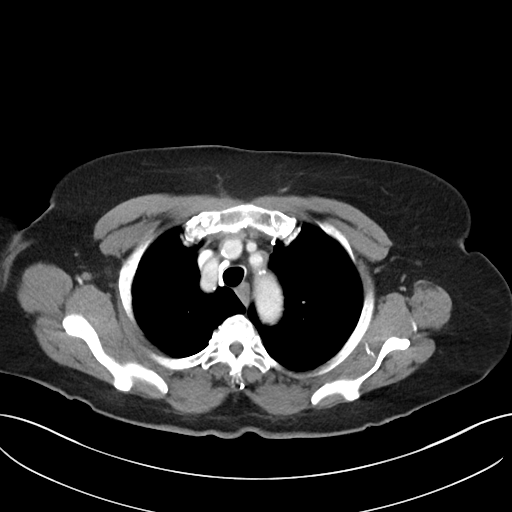

[Series 5: coronal · coronal · 0.79mm/px · 3 of 103 slices shown]
[im 35/103  soft-tissue]
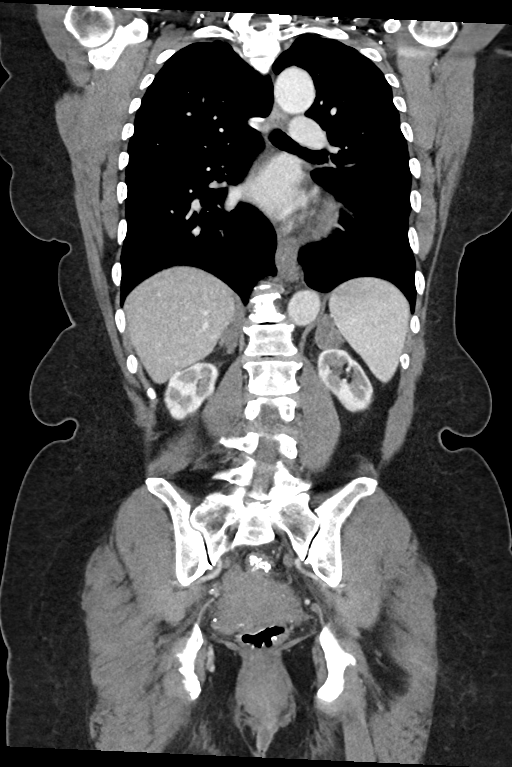
[im 46/103  soft-tissue]
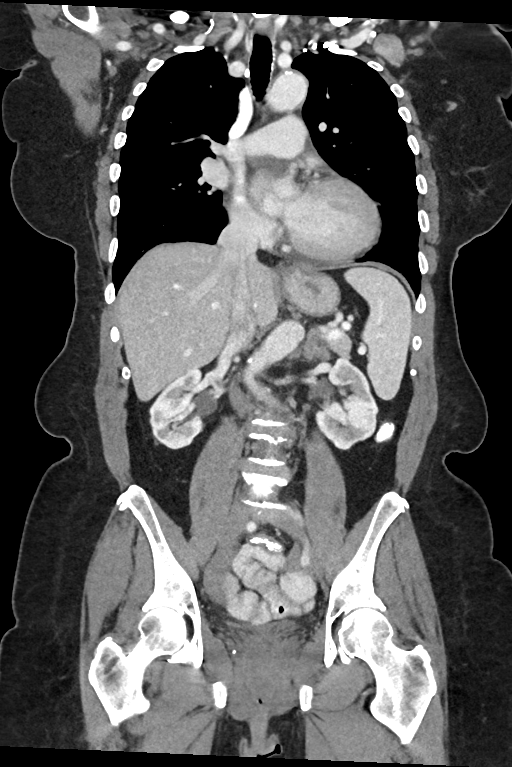
[im 57/103  soft-tissue]
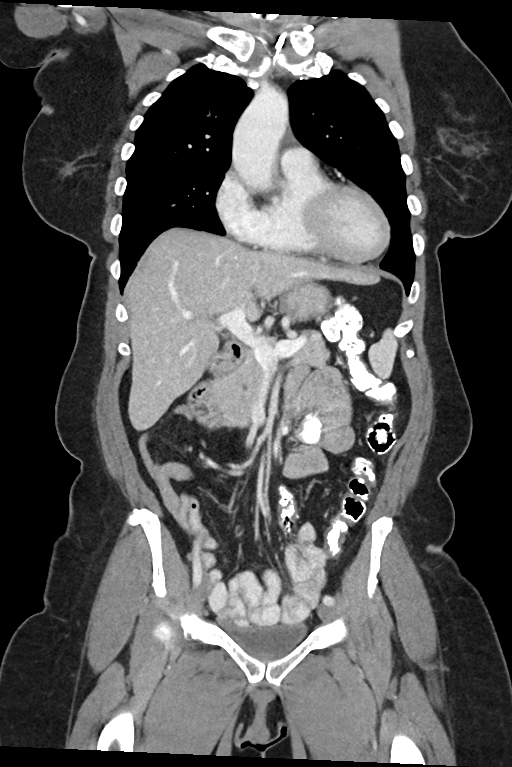

[14 of 46 positions shown; findings below may reference images not displayed]

FINDINGS: CT CHEST FINDINGS

Cardiovascular: Aortic atherosclerosis. No aneurysmal dilation of
the thoracic aorta. Mild cardiac enlargement similar to the prior
study without pericardial effusion. Central pulmonary vasculature is
unremarkable.

Mediastinum/Nodes: Esophagus mildly thickened in the setting of
small hiatal hernia, nonspecific. No axillary, thoracic inlet,
mediastinal or hilar lymphadenopathy.

Lungs/Pleura: Lungs are clear.  Airways are patent.  No effusion.

Musculoskeletal: See below for full musculoskeletal details.

CT ABDOMEN PELVIS FINDINGS

Hepatobiliary: Small hypodensities in the liver likely cysts,
unchanged since Wednesday August, 2018. No new or suspicious lesion. Mildly
lobular hepatic contour without change. Portal vein is patent. No
biliary duct dilation. No pericholecystic stranding. Since 3636.

Pancreas: Normal, without mass, inflammation or ductal dilatation.

Spleen: Spleen normal in size and contour with small low-density
foci in the cephalad and caudal spleen which are unchanged.
Presumably benign.

Adrenals/Urinary Tract: Bilateral adreniform thickening of the
adrenal glands without change.

Marked cortical scarring of the bilateral kidneys. No
hydronephrosis. No perinephric stranding.

Stomach/Bowel: Small hiatal hernia as described. Post RIGHT
hemicolectomy without signs of bowel obstruction or acute bowel
process.

Vascular/Lymphatic: Calcified atheromatous plaque in the abdominal
aorta and noncalcified plaque without aneurysmal dilation. There is
no gastrohepatic or hepatoduodenal ligament lymphadenopathy. No
retroperitoneal or mesenteric lymphadenopathy.

No pelvic sidewall lymphadenopathy.

Reproductive: Unremarkable by CT.

Other: Resolution of fluid in the anterior abdominal wall. No sign
of abdominal wall hernia. Soft tissue density along the anterior
abdominal wall presumably postoperative scarring measuring 4.3 x
cm at the site of the previous fluid collection.

Musculoskeletal: No acute bone finding. No destructive bone process.
Spinal degenerative changes. Mild-to-moderate narrowing and areas of
the lumbar spine with moderate central canal narrowing at the L3-4
level in particular due to short pedicles and facet hypertrophy.
IMPRESSION: Post RIGHT hemicolectomy without signs of new or progressive
findings to indicate disease recurrence.

Postoperative scarring following resolution of a large seroma along
the anterior abdominal wall.

No evidence of metastatic disease in the chest, abdomen or pelvis.

Small hiatal hernia.

Marked cortical scarring of the bilateral kidneys.

Stable adrenal thickening and nodularity likely nodular hyperplasia.

Aortic atherosclerosis.

Aortic Atherosclerosis ([id number]).

## 2021-11-22 ENCOUNTER — Telehealth: Payer: Self-pay

## 2021-11-22 ENCOUNTER — Other Ambulatory Visit: Payer: Self-pay

## 2021-11-22 DIAGNOSIS — C189 Malignant neoplasm of colon, unspecified: Secondary | ICD-10-CM

## 2021-11-22 NOTE — Telephone Encounter (Signed)
A referral to SW was made.

## 2021-11-22 NOTE — Telephone Encounter (Signed)
CSW attempted to contact patient per the request of NP to assess.  After CSW introduced self, patient stated she received what was sent in the mail and that they called her last week.  She said "bye" and disconnected the phone.  CSW has not communicated with patient previously.  CSW to attempt to contact patient again.

## 2021-11-24 ENCOUNTER — Other Ambulatory Visit: Payer: Medicare Other

## 2021-11-24 ENCOUNTER — Encounter: Payer: Medicare Other | Admitting: Genetic Counselor

## 2021-11-30 ENCOUNTER — Other Ambulatory Visit: Payer: Self-pay | Admitting: Gastroenterology

## 2021-12-28 ENCOUNTER — Other Ambulatory Visit: Payer: Self-pay | Admitting: Gastroenterology

## 2022-01-13 ENCOUNTER — Other Ambulatory Visit: Payer: Self-pay | Admitting: Internal Medicine

## 2022-01-14 LAB — CBC
HCT: 45.4 % — ABNORMAL HIGH (ref 35.0–45.0)
Hemoglobin: 15 g/dL (ref 11.7–15.5)
MCH: 29.8 pg (ref 27.0–33.0)
MCHC: 33 g/dL (ref 32.0–36.0)
MCV: 90.3 fL (ref 80.0–100.0)
MPV: 10 fL (ref 7.5–12.5)
Platelets: 287 10*3/uL (ref 140–400)
RBC: 5.03 10*6/uL (ref 3.80–5.10)
RDW: 12.9 % (ref 11.0–15.0)
WBC: 5.2 10*3/uL (ref 3.8–10.8)

## 2022-01-14 LAB — COMPLETE METABOLIC PANEL WITH GFR
AG Ratio: 1.5 (calc) (ref 1.0–2.5)
ALT: 8 U/L (ref 6–29)
AST: 13 U/L (ref 10–35)
Albumin: 4.3 g/dL (ref 3.6–5.1)
Alkaline phosphatase (APISO): 81 U/L (ref 37–153)
BUN/Creatinine Ratio: 13 (calc) (ref 6–22)
BUN: 22 mg/dL (ref 7–25)
CO2: 26 mmol/L (ref 20–32)
Calcium: 9.4 mg/dL (ref 8.6–10.4)
Chloride: 108 mmol/L (ref 98–110)
Creat: 1.69 mg/dL — ABNORMAL HIGH (ref 0.60–1.00)
Globulin: 2.8 g/dL (calc) (ref 1.9–3.7)
Glucose, Bld: 84 mg/dL (ref 65–99)
Potassium: 4.3 mmol/L (ref 3.5–5.3)
Sodium: 144 mmol/L (ref 135–146)
Total Bilirubin: 0.4 mg/dL (ref 0.2–1.2)
Total Protein: 7.1 g/dL (ref 6.1–8.1)
eGFR: 32 mL/min/{1.73_m2} — ABNORMAL LOW (ref >=60)

## 2022-01-14 LAB — LIPID PANEL
Cholesterol: 160 mg/dL (ref <200)
HDL: 55 mg/dL (ref >=50)
LDL Cholesterol (Calc): 83 mg/dL (calc)
Non-HDL Cholesterol (Calc): 105 mg/dL (calc) (ref <130)
Total CHOL/HDL Ratio: 2.9 (calc) (ref <5.0)
Triglycerides: 128 mg/dL (ref <150)

## 2022-01-14 LAB — TSH: TSH: 0.75 mIU/L (ref 0.40–4.50)

## 2022-01-22 ENCOUNTER — Other Ambulatory Visit: Payer: Self-pay | Admitting: Gastroenterology

## 2022-02-03 ENCOUNTER — Telehealth: Payer: Self-pay | Admitting: Gastroenterology

## 2022-02-03 ENCOUNTER — Telehealth: Payer: Self-pay | Admitting: Pharmacy Technician

## 2022-02-03 ENCOUNTER — Other Ambulatory Visit (HOSPITAL_COMMUNITY): Payer: Self-pay

## 2022-02-03 NOTE — Telephone Encounter (Signed)
Patient called, stated [facility] called stating that her PEPCID medication needs a prior authorization before it is refilled. Thank you.

## 2022-02-03 NOTE — Telephone Encounter (Signed)
Patient Advocate Encounter  Received notification from [employer] that prior authorization for FAMOTIDINE '40MG'$  is required.   PA submitted on 1.4.24 Key [id number]  PA is not needed. Test billing results with [facility] returned a copay of $5

## 2022-02-03 NOTE — Telephone Encounter (Signed)
Telephone encounter created  

## 2022-02-04 ENCOUNTER — Other Ambulatory Visit: Payer: Self-pay | Admitting: Gastroenterology

## 2022-03-08 ENCOUNTER — Other Ambulatory Visit: Payer: Self-pay | Admitting: Gastroenterology

## 2022-03-09 ENCOUNTER — Telehealth: Payer: Self-pay | Admitting: Gastroenterology

## 2022-03-09 MED ORDER — FAMOTIDINE 40 MG PO TABS: 40.0000 mg | ORAL_TABLET | Freq: Every day | ORAL | 1 refills | Status: DC

## 2022-03-09 NOTE — Telephone Encounter (Signed)
Informed patient she is due for follow up visit with Dr. Fuller Plan and we can send her enough refills to last until her appt. Patient scheduled appt for 04/05/22. Prescription sent to patient's pharmacy.

## 2022-03-09 NOTE — Telephone Encounter (Signed)
Patient is calling checking up on Famotidine 40 MG refill. Please advise

## 2022-03-09 NOTE — Telephone Encounter (Signed)
Called patient and had a bad connection. Call was dropped or patient hung up. Will await call back or try again later.

## 2022-03-24 ENCOUNTER — Inpatient Hospital Stay: Payer: Medicare Other | Admitting: Oncology

## 2022-03-24 ENCOUNTER — Inpatient Hospital Stay: Payer: Medicare Other

## 2022-04-05 ENCOUNTER — Encounter: Payer: Self-pay | Admitting: Gastroenterology

## 2022-04-05 ENCOUNTER — Ambulatory Visit: Payer: Medicare Other | Admitting: Gastroenterology

## 2022-04-05 VITALS — BP 140/90 | HR 73 | Ht 63.0 in | Wt 170.8 lb

## 2022-04-05 DIAGNOSIS — K219 Gastro-esophageal reflux disease without esophagitis: Secondary | ICD-10-CM

## 2022-04-05 DIAGNOSIS — Z8719 Personal history of other diseases of the digestive system: Secondary | ICD-10-CM | POA: Diagnosis not present

## 2022-04-05 DIAGNOSIS — Z85038 Personal history of other malignant neoplasm of large intestine: Secondary | ICD-10-CM | POA: Diagnosis not present

## 2022-04-05 MED ORDER — FAMOTIDINE 40 MG PO TABS: 40.0000 mg | ORAL_TABLET | Freq: Two times a day (BID) | ORAL | 11 refills | Status: DC

## 2022-04-05 MED ORDER — HYDROCORTISONE (PERIANAL) 2.5 % EX CREA: 1.0000 | TOPICAL_CREAM | Freq: Two times a day (BID) | CUTANEOUS | 1 refills | Status: AC

## 2022-04-05 NOTE — Patient Instructions (Signed)
We have sent the following medications to your pharmacy for you to pick up at your convenience: hydrocortisone cream topically twice daily x 10 days then as needed.   RECTAL CARE INSTRUCTIONS:  1. Sitz Baths twice a day for 10 minutes each. 2. Thoroughly clean and dry the rectum. 3. Put Tucks pad against the rectum at night. 4. Clean the rectum with Balenol lotion after each bowel movement.  The [facility] providers would like to encourage you to use [facility] to communicate with providers for non-urgent requests or questions.  Due to long hold times on the telephone, sending your provider a message by [facility] may be a faster and more efficient way to get a response.  Please allow 48 business hours for a response.  Please remember that this is for non-urgent requests.   Thank you for choosing me and [facility].  Pricilla Riffle. Dagoberto Ligas., MD., [facility]

## 2022-04-05 NOTE — Addendum Note (Signed)
Addended by: Dorisann Frames L on: 04/05/2022 10:08 AM   Modules accepted: Orders

## 2022-04-05 NOTE — Progress Notes (Signed)
    Assessment     GERD with history of an esophageal stricture External tags and external hemorrhoids  Internal hemorrhoids S/P banding in 2023 Stage III colon cancer followed by oncology   Recommendations    Increase famotidine to 40 mg bid and follow antireflux measures Rectal care instructions and hydrocortisone 2.5 % cream topically bid for 10 days then as needed.  Surveillance colonoscopy recommended in November 2025   HPI    This is a 72 year old female with a history of GERD with an esophageal stricture, stage III colon cancer and hemorrhoids.  Her reflux is currently fairly well-controlled on famotidine 40 mg daily however she has frequent daytime breakthrough symptoms. Internal hemorrhoids were banded in early 2023.  She notes external hemorrhoids and skin tags that have been bothersome with swelling and slight discomfort over the past few months.  She has not used any hemorrhoid treatments. No constipation or straining with bowel movements. No bleeding noted.    Labs / Imaging       Latest Ref Rng & Units 01/13/2022   11:25 AM 11/15/2017   10:32 AM 01/05/2015    8:15 AM  Hepatic Function  Total Protein 6.1 - 8.1 g/dL 7.1  7.1  7.3   Albumin 3.5 - 5.2 g/dL  4.1  4.0   AST 10 - 35 U/L '13  12  16   '$ ALT 6 - 29 U/L '8  8  9   '$ Alk Phosphatase 39 - 117 U/L  79  108   Total Bilirubin 0.2 - 1.2 mg/dL 0.4  0.5  <0.30        Latest Ref Rng & Units 01/13/2022   11:25 AM 04/30/2019   10:25 AM 09/07/2018   11:45 AM  CBC  WBC 3.8 - 10.8 Thousand/uL 5.2  6.1  6.7   Hemoglobin 11.7 - 15.5 g/dL 15.0  12.5  12.0   Hematocrit 35.0 - 45.0 % 45.4  41.5  38.7   Platelets 140 - 400 Thousand/uL 287  269  308    Current Medications, Allergies, Past Medical History, Past Surgical History, Family History and Social History were reviewed in [employer] record.   Physical Exam: General: Well developed, well nourished, no acute distress Head: Normocephalic and  atraumatic Eyes: Sclerae anicteric, EOMI Ears: Normal auditory acuity Mouth: No deformities or lesions noted Lungs: Clear throughout to auscultation Heart: Regular rate and rhythm; No murmurs, rubs or bruits Abdomen: Soft, non tender and non distended. No masses, hepatosplenomegaly or hernias noted. Normal Bowel sounds Rectal: Moderate sized external tags and external hemorrhoids, no internal lesions, no tenderness, no stool for Hemoccult Musculoskeletal: Symmetrical with no gross deformities  Pulses:  Normal pulses noted Extremities: No edema or deformities noted Neurological: Alert oriented x 4, grossly nonfocal Psychological:  Alert and cooperative. Normal mood and affect   Rachelann Enloe T. Fuller Plan, MD 04/05/2022, 8:58 AM

## 2022-04-12 ENCOUNTER — Inpatient Hospital Stay: Payer: Medicare Other | Attending: Oncology

## 2022-04-12 ENCOUNTER — Inpatient Hospital Stay (HOSPITAL_BASED_OUTPATIENT_CLINIC_OR_DEPARTMENT_OTHER): Payer: Medicare Other | Admitting: Oncology

## 2022-04-12 ENCOUNTER — Encounter: Payer: Self-pay | Admitting: Oncology

## 2022-04-12 VITALS — BP 149/88 | HR 78 | Temp 98.1°F | Resp 18 | Ht 63.0 in | Wt 169.2 lb

## 2022-04-12 DIAGNOSIS — C189 Malignant neoplasm of colon, unspecified: Secondary | ICD-10-CM | POA: Diagnosis not present

## 2022-04-12 DIAGNOSIS — Z85038 Personal history of other malignant neoplasm of large intestine: Secondary | ICD-10-CM | POA: Diagnosis not present

## 2022-04-12 DIAGNOSIS — Z9221 Personal history of antineoplastic chemotherapy: Secondary | ICD-10-CM | POA: Diagnosis not present

## 2022-04-12 LAB — CEA (ACCESS): CEA (CHCC): 3.56 ng/mL (ref 0.00–5.00)

## 2022-04-12 NOTE — Progress Notes (Signed)
[place] OFFICE PROGRESS NOTE   Diagnosis: Colon cancer  INTERVAL HISTORY:   Gail Carlson returns as scheduled.  She underwent hemorrhoid banding by Dr. Fuller Plan in January and February.  She saw Dr. Fuller Plan last week.  She was found to have external hemorrhoids on rectal exam.  She feels well.  Rectal bleeding has improved.   Objective:  Vital signs in last 24 hours:  Blood pressure (!) 149/88, pulse 78, temperature 98.1 F (36.7 C), temperature source Oral, resp. rate 18, height '5\' 3"'$  (1.6 m), weight 169 lb 3.2 oz (76.7 kg), SpO2 100 %.     Lymphatics: No cervical, supraclavicular, axillary, or inguinal nodes Resp: Lungs clear bilaterally Cardio: Regular rate and rhythm GI: Nontender, no mass, no hepatosplenomegaly Vascular: No leg edema   Lab Results:  Lab Results  Component Value Date   WBC 5.2 01/13/2022   HGB 15.0 01/13/2022   HCT 45.4 (H) 01/13/2022   MCV 90.3 01/13/2022   PLT 287 01/13/2022   NEUTROABS 3.3 11/15/2017    CMP  Lab Results  Component Value Date   NA 144 01/13/2022   K 4.3 01/13/2022   CL 108 01/13/2022   CO2 26 01/13/2022   GLUCOSE 84 01/13/2022   BUN 22 01/13/2022   CREATININE 1.69 (H) 01/13/2022   CALCIUM 9.4 01/13/2022   PROT 7.1 01/13/2022   ALBUMIN 4.1 11/15/2017   AST 13 01/13/2022   ALT 8 01/13/2022   ALKPHOS 79 11/15/2017   BILITOT 0.4 01/13/2022   GFRNONAA 36 (L) 10/11/2021   GFRAA 43 (L) 10/01/2019    Lab Results  Component Value Date   CEA1 3.59 09/23/2020   CEA 3.33 09/27/2021     Medications: I have reviewed the patient's current medications.   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 CTs of the chest, abdomen, and pelvis on 01/10/2014 with a cecal mass, nonspecific small liver lesions, and nonspecific lung nodules  felt to most likely represent mucoid impaction Cycle 1 FOLFOX 02/20/2014 Cycle 2 FOLFOX 03/06/2014 Cycle 3 FOLFOX 03/20/2014 Cycle 4 FOLFOX 04/03/2014 Cycle 5 FOLFOX held on 04/17/2014 due to neutropenia Cycle 5 FOLFOX 04/24/2014 with Neulasta support Cycle 6 FOLFOX 05/08/2014 Cycle 7 FOLFOX 05/22/2014 with Neulasta support Cycle 8 FOLFOX 06/05/2014 (oxaliplatin held secondary to thrombocytopenia) Cycle 9 FOLFOX 06/19/2014 with Neulasta support Cycle 10 FOLFOX 07/03/2014 (oxaliplatin held secondary to thrombocytopenia) Cycle 11 FOLFOX 07/17/2014 Cycle 12 FOLFOX 07/31/2014 (oxaliplatin dose reduced due to thrombocytopenia CTs chest, abdomen, and pelvis on 01/05/2015-no evidence of recurrent disease Colonoscopy 09/15/2015-tubular adenoma removed from the transverse colon CTs chest, abdomen, pelvis on 01/14/2016-no evidence of recurrent disease CTs 01/12/2017-no evidence of recurrent cancer. Colonoscopy 10/25/2017-1 cm deep firm ulceration on the colonic side distal aspect of the anastomosis.  Pathology-adenocarcinoma CEA 11/15/2017-2.2 CT abdomen/pelvis 11/16/2017- no evidence of recurrent or metastatic carcinoma.  Stable small hiatal hernia and periumbilical ventral hernia.  Stable benign bilateral adrenal lesions. Resection of anastomosis 01/11/2018-adenocarcinoma, recurrent, moderately differentiated (2 cm) involving the prior anastomotic site; tumor invades muscularis propria; no carcinoma identified in 7 lymph nodes.  Margins negative. CTs 08/14/2018-masslike collection of fluid in the subcutaneous tissue of the umbilicus Ultrasound-guided aspiration of anterior abdominal wall complex fluid collection 09/07/2018, negative cytology Colonoscopy 10/30/2018-superficial ulceration at the ileocolonic anastomosis, biopsy negative for malignancy; repeat colonoscopy in 2 years for surveillance CTs 09/24/2019-recurrent collection at the site  of the previous subcutaneous fluid collection CTs  09/23/2020-resolution of seroma at the anterior abdominal wall, no evidence of recurrent disease Colonoscopy 12/28/2020-polyp in the transverse colon removed-tubular adenoma CTs 09/27/2021-no evidence of recurrent or metastatic disease    History of Iron deficiency anemia Family history of breast cancer Port-A-Cath placement 02/18/2014 Delayed nausea following cycle 1 FOLFOX. Aloxi and Emend added beginning with cycle 2. Prophylactic Decadron added beginning with cycle 3. Neutropenia following cycle 4 FOLFOX. Neulasta was added with cycle 5. Hypokalemia 04/17/2014. Question secondary to diarrhea. Potassium supplement initiated. Left upper arm pain. Negative venous Doppler 03/07/2014. She was referred to orthopedics. 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  Pathology on seroma cavity, abdominal wall excision-benign fibrous tissue with adherent fibrinous debris.      Disposition: Gail Carlson is in clinical remission from colon cancer.  We will follow-up on the CEA from today.  She will return for an office visit and surveillance CTs in August.  She will continue follow-up with Dr. Fuller Plan for management of internal hemorrhoids and colonoscopy surveillance.  [name], MD  04/12/2022  8:40 AM

## 2022-04-13 ENCOUNTER — Telehealth: Payer: Self-pay

## 2022-04-13 NOTE — Telephone Encounter (Signed)
Patient gave verbal understanding and had no further questions or concerns  

## 2022-04-13 NOTE — Telephone Encounter (Signed)
-----   Message from Owens Shark, NP sent at 04/12/2022  4:52 PM EDT ----- Please let her know CEA is stable in normal range, follow-up as scheduled.

## 2022-05-31 ENCOUNTER — Other Ambulatory Visit: Payer: Self-pay | Admitting: Gastroenterology

## 2022-08-17 ENCOUNTER — Other Ambulatory Visit: Payer: Self-pay | Admitting: Internal Medicine

## 2022-08-17 DIAGNOSIS — Z1231 Encounter for screening mammogram for malignant neoplasm of breast: Secondary | ICD-10-CM

## 2022-09-27 ENCOUNTER — Inpatient Hospital Stay: Payer: Medicare Other | Attending: Oncology

## 2022-09-27 ENCOUNTER — Other Ambulatory Visit: Payer: Self-pay | Admitting: Oncology

## 2022-09-27 ENCOUNTER — Ambulatory Visit (HOSPITAL_BASED_OUTPATIENT_CLINIC_OR_DEPARTMENT_OTHER)
Admission: RE | Admit: 2022-09-27 | Discharge: 2022-09-27 | Disposition: A | Discharge status: Home / Self Care | Payer: Medicare Other | Source: Ambulatory Visit | Attending: Oncology | Admitting: Oncology

## 2022-09-27 DIAGNOSIS — Z85038 Personal history of other malignant neoplasm of large intestine: Secondary | ICD-10-CM | POA: Insufficient documentation

## 2022-09-27 DIAGNOSIS — Z08 Encounter for follow-up examination after completed treatment for malignant neoplasm: Secondary | ICD-10-CM | POA: Insufficient documentation

## 2022-09-27 DIAGNOSIS — C189 Malignant neoplasm of colon, unspecified: Secondary | ICD-10-CM

## 2022-09-27 LAB — BASIC METABOLIC PANEL - CANCER CENTER ONLY
Anion gap: 7 (ref 5–15)
BUN: 19 mg/dL (ref 8–23)
CO2: 28 mmol/L (ref 22–32)
Calcium: 9.1 mg/dL (ref 8.9–10.3)
Chloride: 108 mmol/L (ref 98–111)
Creatinine: 1.59 mg/dL — ABNORMAL HIGH (ref 0.44–1.00)
GFR, Estimated: 34 mL/min — ABNORMAL LOW (ref >60)
Glucose, Bld: 110 mg/dL — ABNORMAL HIGH (ref 70–99)
Potassium: 4.1 mmol/L (ref 3.5–5.1)
Sodium: 143 mmol/L (ref 135–145)

## 2022-09-27 LAB — CEA (ACCESS): CEA (CHCC): 3.43 ng/mL (ref 0.00–5.00)

## 2022-09-28 ENCOUNTER — Ambulatory Visit: Payer: Medicare Other

## 2022-09-29 ENCOUNTER — Telehealth: Payer: Self-pay

## 2022-09-29 ENCOUNTER — Inpatient Hospital Stay: Payer: Medicare Other | Admitting: Nurse Practitioner

## 2022-09-29 ENCOUNTER — Encounter: Payer: Self-pay | Admitting: Nurse Practitioner

## 2022-09-29 VITALS — BP 150/89 | HR 77 | Temp 98.2°F | Resp 18 | Ht 63.0 in | Wt 163.7 lb

## 2022-09-29 DIAGNOSIS — Z85038 Personal history of other malignant neoplasm of large intestine: Secondary | ICD-10-CM | POA: Diagnosis not present

## 2022-09-29 DIAGNOSIS — Z08 Encounter for follow-up examination after completed treatment for malignant neoplasm: Secondary | ICD-10-CM | POA: Diagnosis present

## 2022-09-29 DIAGNOSIS — C189 Malignant neoplasm of colon, unspecified: Secondary | ICD-10-CM

## 2022-09-29 NOTE — Progress Notes (Signed)
[facility] OFFICE PROGRESS NOTE   Diagnosis: Colon cancer  INTERVAL HISTORY:   Gail Carlson returns as scheduled.  She feels well.  Bowels moving regularly.  No pain with bowel movements.  She occasionally notes a small amount of blood on the toilet tissue after a bowel movement.  She feels this is due to hemorrhoids.  She has a good appetite.  Objective:  Vital signs in last 24 hours:  Blood pressure (!) 150/89, pulse 77, temperature 98.2 F (36.8 C), temperature source Oral, resp. rate 18, height 5\' 3"  (1.6 m), weight 163 lb 11.2 oz (74.3 kg), SpO2 100%.    Lymphatics: No palpable cervical, supraclavicular, axillary or inguinal lymph nodes. Resp: Lungs clear bilaterally. Cardio: Regular rate and rhythm. GI: No hepatosplenomegaly. Vascular: No leg edema.   Lab Results:  Lab Results  Component Value Date   WBC 5.2 01/13/2022   HGB 15.0 01/13/2022   HCT 45.4 (H) 01/13/2022   MCV 90.3 01/13/2022   PLT 287 01/13/2022   NEUTROABS 3.3 11/15/2017    Imaging:  CT CHEST ABDOMEN PELVIS WO CONTRAST  Result Date: 09/27/2022 CLINICAL DATA:  Surveillance, history of colon cancer. Currently asymptomatic. * Tracking Code: BO * EXAM: CT CHEST, ABDOMEN AND PELVIS WITHOUT CONTRAST TECHNIQUE: Multidetector CT imaging of the chest, abdomen and pelvis was performed following the standard protocol without IV contrast. RADIATION DOSE REDUCTION: This exam was performed according to the departmental dose-optimization program which includes automated exposure control, adjustment of the mA and/or kV according to patient size and/or use of iterative reconstruction technique. COMPARISON:  Multiple priors including most recent CT September 27, 2021 FINDINGS: CT CHEST FINDINGS Cardiovascular: Aortic atherosclerosis. Coronary artery calcifications. Normal size heart. Trace pericardial effusion is similar prior Mediastinum/Nodes: No suspicious thyroid nodule. No pathologically enlarged  mediastinal, hilar or axillary lymph nodes. Small hiatal hernia. Lungs/Pleura: No suspicious pulmonary nodules or masses. No pleural effusion. No pneumothorax. Musculoskeletal: No aggressive lytic or blastic lesion of bone. CT ABDOMEN PELVIS FINDINGS Hepatobiliary: Hypodensity in the posterior right hepatic lobe is stable from prior and likely reflects a cyst. Gallbladder is unremarkable. No biliary ductal dilation. Pancreas: No pancreatic ductal dilation or evidence of acute inflammation Spleen: No splenomegaly Adrenals/Urinary Tract: Stable bilateral diffuse nodular enlargement of the adrenal glands compatible with adrenal hyperplasia and adenomas. No new suspicious adrenal nodules identified. No hydronephrosis. Urinary bladder is minimally distended limiting evaluation. Stomach/Bowel: Stomach is nondistended limiting evaluation. No pathologic dilation of small or large bowel. Prior right hemicolectomy. No suspicious nodularity along the suture line. Vascular/Lymphatic: Aortic atherosclerosis. Smooth IVC contours. No pathologically enlarged abdominal or pelvic lymph nodes Reproductive: Uterus and bilateral adnexa are unremarkable. Other: No significant abdominopelvic free fluid. Musculoskeletal: No aggressive lytic or blastic lesion of bone IMPRESSION: 1. Prior right hemicolectomy without noncontrast enhanced CT evidence of recurrent or metastatic disease in the chest, abdomen or pelvis. 2. Stable bilateral diffuse nodular enlargement of the adrenal glands compatible with adrenal hyperplasia and adenomas. 3.  Aortic Atherosclerosis (ICD10-I70.0). Electronically Signed   By: Maudry Mayhew M.D.   On: 09/27/2022 10:31    Medications: I have reviewed the patient's current medications.  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 CTs  of the chest, abdomen, and pelvis on 01/10/2014 with a cecal mass, nonspecific small liver lesions, and nonspecific lung nodules felt to most likely represent mucoid impaction Cycle  1 FOLFOX 02/20/2014 Cycle 2 FOLFOX 03/06/2014 Cycle 3 FOLFOX 03/20/2014 Cycle 4 FOLFOX 04/03/2014 Cycle 5 FOLFOX held on 04/17/2014 due to neutropenia Cycle 5 FOLFOX 04/24/2014 with Neulasta support Cycle 6 FOLFOX 05/08/2014 Cycle 7 FOLFOX 05/22/2014 with Neulasta support Cycle 8 FOLFOX 06/05/2014 (oxaliplatin held secondary to thrombocytopenia) Cycle 9 FOLFOX 06/19/2014 with Neulasta support Cycle 10 FOLFOX 07/03/2014 (oxaliplatin held secondary to thrombocytopenia) Cycle 11 FOLFOX 07/17/2014 Cycle 12 FOLFOX 07/31/2014 (oxaliplatin dose reduced due to thrombocytopenia CTs chest, abdomen, and pelvis on 01/05/2015-no evidence of recurrent disease Colonoscopy 09/15/2015-tubular adenoma removed from the transverse colon CTs chest, abdomen, pelvis on 01/14/2016-no evidence of recurrent disease CTs 01/12/2017-no evidence of recurrent cancer. Colonoscopy 10/25/2017-1 cm deep firm ulceration on the colonic side distal aspect of the anastomosis.  Pathology-adenocarcinoma CEA 11/15/2017-2.2 CT abdomen/pelvis 11/16/2017- no evidence of recurrent or metastatic carcinoma.  Stable small hiatal hernia and periumbilical ventral hernia.  Stable benign bilateral adrenal lesions. Resection of anastomosis 01/11/2018-adenocarcinoma, recurrent, moderately differentiated (2 cm) involving the prior anastomotic site; tumor invades muscularis propria; no carcinoma identified in 7 lymph nodes.  Margins negative. CTs 08/14/2018-masslike collection of fluid in the subcutaneous tissue of the umbilicus Ultrasound-guided aspiration of anterior abdominal wall complex fluid collection 09/07/2018, negative cytology Colonoscopy 10/30/2018-superficial ulceration at the ileocolonic anastomosis, biopsy negative for malignancy; repeat colonoscopy in 2 years  for surveillance CTs 09/24/2019-recurrent collection at the site of the previous subcutaneous fluid collection CTs 09/23/2020-resolution of seroma at the anterior abdominal wall, no evidence of recurrent disease Colonoscopy 12/28/2020-polyp in the transverse colon removed-tubular adenoma CTs 09/27/2021-no evidence of recurrent or metastatic disease CTs 09/27/2022-no evidence of recurrent or metastatic disease    History of Iron deficiency anemia Family history of breast cancer Port-A-Cath placement 02/18/2014 Delayed nausea following cycle 1 FOLFOX. Aloxi and Emend added beginning with cycle 2. Prophylactic Decadron added beginning with cycle 3. Neutropenia following cycle 4 FOLFOX. Neulasta was added with cycle 5. Hypokalemia 04/17/2014. Question secondary to diarrhea. Potassium supplement initiated. Left upper arm pain. Negative venous Doppler 03/07/2014. She was referred to orthopedics. 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  Pathology on seroma cavity, abdominal wall excision-benign fibrous tissue with adherent fibrinous debris.    Disposition: Gail Carlson remains in remission from colon cancer.  Recent CT showed no evidence of recurrent or metastatic disease.  CEA is stable in normal range.  Next colonoscopy is due November 2025.  We are forwarding a copy of the labs from earlier this week to Dr. Concepcion Elk for follow-up of abnormal kidney function.  She will return for a CEA and follow-up visit in 6 months.    Lonna Cobb ANP/GNP-BC   09/29/2022  8:43 AM

## 2022-09-29 NOTE — Telephone Encounter (Signed)
I have faxed the lab results to the patient's primary care physician at [phone].

## 2022-10-05 ENCOUNTER — Ambulatory Visit
Admission: RE | Admit: 2022-10-05 | Discharge: 2022-10-05 | Disposition: A | Discharge status: Home / Self Care | Payer: Medicare Other | Source: Ambulatory Visit | Attending: Internal Medicine | Admitting: Internal Medicine

## 2022-10-05 DIAGNOSIS — Z1231 Encounter for screening mammogram for malignant neoplasm of breast: Secondary | ICD-10-CM

## 2023-02-27 ENCOUNTER — Telehealth: Payer: Self-pay | Admitting: Gastroenterology

## 2023-02-27 NOTE — Telephone Encounter (Signed)
Patient called requesting a refill for hemorrhoids.

## 2023-02-27 NOTE — Telephone Encounter (Signed)
Patient states  she was prescribed hydrocortisone 2.5 %cream to use as needed for her hemorrhoids by Dr. Russella Dar. Patient has scheduled an appt to see Boone Master, PA on 04/05/23. [place], please advise if we can refill cream prior to appt with you.

## 2023-02-28 MED ORDER — HYDROCORTISONE (PERIANAL) 2.5 % EX CREA: 1.0000 | TOPICAL_CREAM | Freq: Two times a day (BID) | CUTANEOUS | 0 refills | Status: DC

## 2023-02-28 NOTE — Telephone Encounter (Signed)
Prescription sent to patient's pharmacy until scheduled appt.

## 2023-03-30 ENCOUNTER — Inpatient Hospital Stay: Payer: Medicare Other | Attending: Oncology | Admitting: Oncology

## 2023-03-30 ENCOUNTER — Inpatient Hospital Stay: Payer: Medicare Other

## 2023-03-30 ENCOUNTER — Other Ambulatory Visit: Payer: Medicare Other

## 2023-03-30 VITALS — BP 162/82 | HR 81 | Temp 98.2°F | Resp 18 | Ht 63.0 in | Wt 163.0 lb

## 2023-03-30 DIAGNOSIS — C189 Malignant neoplasm of colon, unspecified: Secondary | ICD-10-CM

## 2023-03-30 DIAGNOSIS — K625 Hemorrhage of anus and rectum: Secondary | ICD-10-CM | POA: Insufficient documentation

## 2023-03-30 DIAGNOSIS — Z9221 Personal history of antineoplastic chemotherapy: Secondary | ICD-10-CM | POA: Insufficient documentation

## 2023-03-30 DIAGNOSIS — Z85038 Personal history of other malignant neoplasm of large intestine: Secondary | ICD-10-CM | POA: Insufficient documentation

## 2023-03-30 LAB — BASIC METABOLIC PANEL - CANCER CENTER ONLY
Anion gap: 5 (ref 5–15)
BUN: 23 mg/dL (ref 8–23)
CO2: 29 mmol/L (ref 22–32)
Calcium: 9.3 mg/dL (ref 8.9–10.3)
Chloride: 108 mmol/L (ref 98–111)
Creatinine: 1.57 mg/dL — ABNORMAL HIGH (ref 0.44–1.00)
GFR, Estimated: 34 mL/min — ABNORMAL LOW (ref >60)
Glucose, Bld: 94 mg/dL (ref 70–99)
Potassium: 4.3 mmol/L (ref 3.5–5.1)
Sodium: 142 mmol/L (ref 135–145)

## 2023-03-30 LAB — CEA (ACCESS): CEA (CHCC): 3.12 ng/mL (ref 0.00–5.00)

## 2023-03-30 NOTE — Progress Notes (Signed)
 [facility] OFFICE PROGRESS NOTE   Diagnosis: Colon cancer  INTERVAL HISTORY:   Ms. Gieselman returns as scheduled.  She feels well.  Good appetite.  No difficulty with bowel function.  She has occasional rectal bleeding with wiping.  She is followed by gastroenterology for hemorrhoids.  She is due for a colonoscopy later this year. Numbness at the feet has resolved.  She has intermittent discomfort at the soles of the feet. Objective:  Vital signs in last 24 hours:  Blood pressure (!) 162/82, pulse 81, temperature 98.2 F (36.8 C), temperature source Temporal, resp. rate 18, height 5\' 3"  (1.6 m), weight 163 lb (73.9 kg), SpO2 100%.     Lymphatics: No cervical, supraclavicular, axillary, or inguinal nodes Resp: Lungs clear bilaterally Cardio: Regular rate and rhythm GI: No hepatosplenomegaly, no mass, nontender Vascular: No leg edema  Lab Results:  Lab Results  Component Value Date   WBC 5.2 01/13/2022   HGB 15.0 01/13/2022   HCT 45.4 (H) 01/13/2022   MCV 90.3 01/13/2022   PLT 287 01/13/2022   NEUTROABS 3.3 11/15/2017    CMP  Lab Results  Component Value Date   NA 142 03/30/2023   K 4.3 03/30/2023   CL 108 03/30/2023   CO2 29 03/30/2023   GLUCOSE 94 03/30/2023   BUN 23 03/30/2023   CREATININE 1.57 (H) 03/30/2023   CALCIUM 9.3 03/30/2023   PROT 7.1 01/13/2022   ALBUMIN 4.1 11/15/2017   AST 13 01/13/2022   ALT 8 01/13/2022   ALKPHOS 79 11/15/2017   BILITOT 0.4 01/13/2022   GFRNONAA 34 (L) 03/30/2023   GFRAA 43 (L) 10/01/2019    Lab Results  Component Value Date   CEA1 3.59 09/23/2020   CEA 3.12 03/30/2023     Medications: I have reviewed the patient's current medications.   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 CTs of the chest, abdomen, and pelvis on  01/10/2014 with a cecal mass, nonspecific small liver lesions, and nonspecific lung nodules felt to most likely represent mucoid impaction Cycle 1 FOLFOX 02/20/2014 Cycle 2 FOLFOX 03/06/2014 Cycle 3 FOLFOX 03/20/2014 Cycle 4 FOLFOX 04/03/2014 Cycle 5 FOLFOX held on 04/17/2014 due to neutropenia Cycle 5 FOLFOX 04/24/2014 with Neulasta support Cycle 6 FOLFOX 05/08/2014 Cycle 7 FOLFOX 05/22/2014 with Neulasta support Cycle 8 FOLFOX 06/05/2014 (oxaliplatin held secondary to thrombocytopenia) Cycle 9 FOLFOX 06/19/2014 with Neulasta support Cycle 10 FOLFOX 07/03/2014 (oxaliplatin held secondary to thrombocytopenia) Cycle 11 FOLFOX 07/17/2014 Cycle 12 FOLFOX 07/31/2014 (oxaliplatin dose reduced due to thrombocytopenia CTs chest, abdomen, and pelvis on 01/05/2015-no evidence of recurrent disease Colonoscopy 09/15/2015-tubular adenoma removed from the transverse colon CTs chest, abdomen, pelvis on 01/14/2016-no evidence of recurrent disease CTs 01/12/2017-no evidence of recurrent cancer. Colonoscopy 10/25/2017-1 cm deep firm ulceration on the colonic side distal aspect of the anastomosis.  Pathology-adenocarcinoma CEA 11/15/2017-2.2 CT abdomen/pelvis 11/16/2017- no evidence of recurrent or metastatic carcinoma.  Stable small hiatal hernia and periumbilical ventral hernia.  Stable benign bilateral adrenal lesions. Resection of anastomosis 01/11/2018-adenocarcinoma, recurrent, moderately differentiated (2 cm) involving the prior anastomotic site; tumor invades muscularis propria; no carcinoma identified in 7 lymph nodes.  Margins negative. CTs 08/14/2018-masslike collection of fluid in the subcutaneous tissue of the umbilicus Ultrasound-guided aspiration of anterior abdominal wall complex fluid collection 09/07/2018, negative cytology Colonoscopy 10/30/2018-superficial ulceration at the ileocolonic anastomosis, biopsy negative for malignancy; repeat colonoscopy  in 2 years for surveillance CTs  09/24/2019-recurrent collection at the site of the previous subcutaneous fluid collection CTs 09/23/2020-resolution of seroma at the anterior abdominal wall, no evidence of recurrent disease Colonoscopy 12/28/2020-polyp in the transverse colon removed-tubular adenoma CTs 09/27/2021-no evidence of recurrent or metastatic disease CTs 09/27/2022-no evidence of recurrent or metastatic disease    History of Iron deficiency anemia Family history of breast cancer Port-A-Cath placement 02/18/2014 Delayed nausea following cycle 1 FOLFOX. Aloxi and Emend added beginning with cycle 2. Prophylactic Decadron added beginning with cycle 3. Neutropenia following cycle 4 FOLFOX. Neulasta was added with cycle 5. Hypokalemia 04/17/2014. Question secondary to diarrhea. Potassium supplement initiated. Left upper arm pain. Negative venous Doppler 03/07/2014. She was referred to orthopedics. 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  Pathology on seroma cavity, abdominal wall excision-benign fibrous tissue with adherent fibrinous debris.     Disposition: Gail Carlson is in clinical remission from colon cancer.  She will return for an office visit and surveillance CTs in 6 months.  She reports she is scheduled for follow-up visit with gastroenterology next month.  She will discuss the rectal bleeding with gastroenterology.  She feels there is a "cyst "at the rectum.  She is due for colonoscopy this year.   Thornton Papas, MD  03/30/2023  10:17 AM

## 2023-03-31 ENCOUNTER — Other Ambulatory Visit: Payer: Medicare Other

## 2023-03-31 ENCOUNTER — Ambulatory Visit: Payer: Medicare Other | Admitting: Oncology

## 2023-04-05 ENCOUNTER — Ambulatory Visit: Payer: Medicare Other | Admitting: Gastroenterology

## 2023-05-01 ENCOUNTER — Ambulatory Visit: Payer: Medicare Other | Admitting: Internal Medicine

## 2023-06-05 NOTE — Progress Notes (Unsigned)
 Chief Complaint: Primary GI MD:  HPI: 73 year old female history of stage 3 colon cncaer s/p right colectomy 12/2013   Discussed the use of AI scribe software for clinical note transcription with the patient, who gave verbal consent to proceed.  History of Present Illness      PREVIOUS GI WORKUP   CTs of the chest, abdomen, and pelvis on 01/10/2014 with a cecal mass, nonspecific small liver lesions, and nonspecific lung nodules felt to most likely represent mucoid impaction  Colonoscopy 09/15/2015-tubular adenoma removed from the transverse colon CTs chest, abdomen, pelvis on 01/14/2016-no evidence of recurrent disease CTs 01/12/2017-no evidence of recurrent cancer. Colonoscopy 10/25/2017-1 cm deep firm ulceration on the colonic side distal aspect of the anastomosis.  Pathology-adenocarcinoma CEA 11/15/2017-2.2 CT abdomen/pelvis 11/16/2017- no evidence of recurrent or metastatic carcinoma.  Stable small hiatal hernia and periumbilical ventral hernia.  Stable benign bilateral adrenal lesions. Resection of anastomosis 01/11/2018-adenocarcinoma, recurrent, moderately differentiated (2 cm) involving the prior anastomotic site; tumor invades muscularis propria; no carcinoma identified in 7 lymph nodes.  Margins negative. CTs 08/14/2018-masslike collection of fluid in the subcutaneous tissue of the umbilicus Ultrasound-guided aspiration of anterior abdominal wall complex fluid collection 09/07/2018, negative cytology Colonoscopy 10/30/2018-superficial ulceration at the ileocolonic anastomosis, biopsy negative for malignancy; repeat colonoscopy in 2 years for surveillance CTs 09/24/2019-recurrent collection at the site of the previous subcutaneous fluid collection CTs 09/23/2020-resolution of seroma at the anterior abdominal wall, no evidence of recurrent disease Colonoscopy 12/28/2020-polyp in the transverse colon removed-tubular adenoma CTs 09/27/2021-no evidence of recurrent or metastatic  disease CTs 09/27/2022-no evidence of recurrent or metastatic disease  Past Medical History:  Diagnosis Date   Allergy    Anemia    Anxiety    Arthritis    shoulders- oxycodone  prn    Colon cancer (HCC)    colon cancer   2013   Constipation    uses laxative prn    Depression    Diabetes mellitus without complication (HCC)    Family history of breast cancer    GERD (gastroesophageal reflux disease)    no meds   History of chemotherapy 2015   History of radiation therapy 2015   Hypertension    Insomnia    Nausea with vomiting 03/13/2014   Neuromuscular disorder (HCC)    hernia   Neuropathy    under the heel part of foot    Renal insufficiency     Past Surgical History:  Procedure Laterality Date   APPENDECTOMY     COLON RESECTION N/A 01/11/2018   Procedure: OPEN COLON RESECTION;  Surgeon: Oralee Billow, MD;  Location: [facility];  Service: General;  Laterality: N/A;   COLON SURGERY     COLONOSCOPY N/A 01/10/2014   Procedure: COLONOSCOPY;  Surgeon: Tobin Forts, MD;  Location: [facility];  Service: Endoscopy;  Laterality: N/A;   COLONOSCOPY     ESOPHAGOGASTRODUODENOSCOPY N/A 01/10/2014   Procedure: ESOPHAGOGASTRODUODENOSCOPY (EGD);  Surgeon: Tobin Forts, MD;  Location: [facility];  Service: Endoscopy;  Laterality: N/A;   HERNIA REPAIR     INCISION AND DRAINAGE ABSCESS N/A 05/09/2019   Procedure: EXCISION AND DRAINAGE OF CHRONIC SEROMA;  Surgeon: Oralee Billow, MD;  Location: [facility];  Service: General;  Laterality: N/A;   INCISIONAL HERNIA REPAIR N/A 01/11/2018   Procedure: HERNIA REPAIR INCISIONAL;  Surgeon: Oralee Billow, MD;  Location: [facility];  Service: General;  Laterality: N/A;   LAPAROTOMY N/A 01/11/2014   Procedure: EXPLORATORY LAPAROTOMY;  Surgeon: Oralee Billow, MD;  Location: [facility];  Service: General;  Laterality: N/A;   PARTIAL COLECTOMY N/A 01/11/2014   Procedure: PARTIAL COLECTOMY;  Surgeon: Oralee Billow, MD;  Location: [facility];  Service: General;  Laterality: N/A;    PORT-A-CATH REMOVAL Left 03/24/2015   Procedure: REMOVAL PORT-A-CATH;  Surgeon: Oralee Billow, MD;  Location: [facility];  Service: General;  Laterality: Left;   PORTACATH PLACEMENT N/A 02/18/2014   Procedure: INSERTION PORT-A-CATH LEFT SUBCLAVIAN VIEN;  Surgeon: Oralee Billow, MD;  Location: [facility];  Service: General;  Laterality: N/A;   SAVORY DILATION N/A 01/10/2014   Procedure: SAVORY DILATION;  Surgeon: Tobin Forts, MD;  Location: [facility];  Service: Endoscopy;  Laterality: N/A;  Please verify with Elvin Hammer what type of dilation he will use   UPPER GASTROINTESTINAL ENDOSCOPY      Current Outpatient Medications  Medication Sig Dispense Refill   amLODipine  (NORVASC ) 10 MG tablet Take 1 tablet (10 mg total) by mouth daily. 30 tablet 1   calcium  carbonate (TUMS - DOSED IN MG ELEMENTAL CALCIUM ) 500 MG chewable tablet Chew 1 tablet by mouth daily.     cholecalciferol (VITAMIN D3) 25 MCG (1000 UNIT) tablet Take 1,000 Units by mouth daily.     citalopram  (CELEXA ) 10 MG tablet Take 10 mg by mouth every evening.      famotidine  (PEPCID ) 40 MG tablet Take 1 tablet (40 mg total) by mouth 2 (two) times daily. 60 tablet 11   gabapentin (NEURONTIN) 100 MG capsule Take 100 mg by mouth at bedtime.     hydrocortisone  (ANUSOL -HC) 2.5 % rectal cream Place 1 Application rectally 2 (two) times daily. 30 g 0   Ibuprofen-diphenhydrAMINE  HCl (ADVIL PM) 200-25 MG CAPS Take 2 capsules by mouth at bedtime as needed (pain/sleep).      lisinopril -hydrochlorothiazide  (PRINZIDE ,ZESTORETIC ) 20-12.5 MG tablet Take 1 tablet by mouth every evening.   0   Multiple Vitamins-Iron (MULTIVITAMINS WITH IRON) TABS tablet Take 1 tablet by mouth every other day.     tiZANidine (ZANAFLEX) 4 MG tablet Take 4 mg by mouth daily.     traZODone (DESYREL) 100 MG tablet Take 100 mg by mouth at bedtime.     No current facility-administered medications for this visit.   Facility-Administered Medications  Ordered in Other Visits  Medication Dose Route Frequency Provider Last Rate Last Admin   sodium chloride  0.9 % injection 10 mL  10 mL Intravenous PRN Sumner Ends, MD        Allergies as of 06/06/2023   (No Known Allergies)    Family History  Problem Relation Age of Onset   Breast cancer Mother        Dx 27s; deceased 28   Diabetes Mother    Diabetes Brother    Uterine cancer Maternal Aunt        Dx 29s; deceased 92s   Breast cancer Cousin        daughter of mat aunt w/ uterine ca   Breast cancer Cousin        daughter of mat aunt w/ uterine ca   Breast cancer Cousin        daughter of mat aunt w/ uterine ca   Colon cancer Neg Hx    Colon polyps Neg Hx    Rectal cancer Neg Hx    Stomach cancer Neg Hx    Esophageal cancer Neg Hx     Social History   Socioeconomic History   Marital status: Widowed  Spouse name: Not on file   Number of children: 2   Years of education: Not on file   Highest education level: Not on file  Occupational History   Occupation: Retired  Tobacco Use   Smoking status: Some Days    Current packs/day: 0.25    Types: Cigarettes   Smokeless tobacco: Never   Tobacco comments:    1 pack will last > 1 week   Vaping Use   Vaping status: Never Used  Substance and Sexual Activity   Alcohol use: Yes    Alcohol/week: 5.0 standard drinks of alcohol    Types: 2 Cans of beer, 3 Shots of liquor per week    Comment: occasional once a month   Drug use: Yes    Types: Marijuana    Comment: occasional   Sexual activity: Not Currently  Other Topics Concern   Not on file  Social History Narrative   Not on file   Social Drivers of Health   Financial Resource Strain: Not on file  Food Insecurity: Not on file  Transportation Needs: Not on file  Physical Activity: Not on file  Stress: Not on file  Social Connections: Not on file  Intimate Partner Violence: Not on file    Review of Systems:    Constitutional: No weight loss, fever, chills,  weakness or fatigue HEENT: Eyes: No change in vision               Ears, Nose, Throat:  No change in hearing or congestion Skin: No rash or itching Cardiovascular: No chest pain, chest pressure or palpitations   Respiratory: No SOB or cough Gastrointestinal: See HPI and otherwise negative Genitourinary: No dysuria or change in urinary frequency Neurological: No headache, dizziness or syncope Musculoskeletal: No new muscle or joint pain Hematologic: No bleeding or bruising Psychiatric: No history of depression or anxiety    Physical Exam:  Vital signs: There were no vitals taken for this visit.  Constitutional: NAD, alert and cooperative Head:  Normocephalic and atraumatic. Eyes:   PEERL, EOMI. No icterus. Conjunctiva pink. Respiratory: Respirations even and unlabored. Lungs clear to auscultation bilaterally.   No wheezes, crackles, or rhonchi.  Cardiovascular:  Regular rate and rhythm. No peripheral edema, cyanosis or pallor.  Gastrointestinal:  Soft, nondistended, nontender. No rebound or guarding. Normal bowel sounds. No appreciable masses or hepatomegaly. Rectal:  Declines Msk:  Symmetrical without gross deformities. Without edema, no deformity or joint abnormality.  Neurologic:  Alert and  oriented x4;  grossly normal neurologically.  Skin:   Dry and intact without significant lesions or rashes. Psychiatric: Oriented to person, place and time. Demonstrates good judgement and reason without abnormal affect or behaviors.  Physical Exam    RELEVANT LABS AND IMAGING: CBC    Component Value Date/Time   WBC 5.2 01/13/2022 1125   RBC 5.03 01/13/2022 1125   HGB 15.0 01/13/2022 1125   HGB 13.8 01/12/2017 0947   HCT 45.4 (H) 01/13/2022 1125   HCT 42.2 01/12/2017 0947   PLT 287 01/13/2022 1125   PLT 248 01/12/2017 0947   MCV 90.3 01/13/2022 1125   MCV 89.8 01/12/2017 0947   MCH 29.8 01/13/2022 1125   MCHC 33.0 01/13/2022 1125   RDW 12.9 01/13/2022 1125   RDW 14.3  01/12/2017 0947   LYMPHSABS 1.0 11/15/2017 1032   LYMPHSABS 1.2 01/12/2017 0947   MONOABS 0.3 11/15/2017 1032   MONOABS 0.3 01/12/2017 0947   EOSABS 0.2 11/15/2017 1032   EOSABS 0.1 01/12/2017  0947   BASOSABS 0.0 11/15/2017 1032   BASOSABS 0.1 01/12/2017 0947    CMP     Component Value Date/Time   NA 142 03/30/2023 0928   NA 140 01/12/2017 0947   K 4.3 03/30/2023 0928   K 4.0 01/12/2017 0947   CL 108 03/30/2023 0928   CO2 29 03/30/2023 0928   CO2 28 01/12/2017 0947   GLUCOSE 94 03/30/2023 0928   GLUCOSE 88 01/12/2017 0947   BUN 23 03/30/2023 0928   BUN 11.4 01/12/2017 0947   CREATININE 1.57 (H) 03/30/2023 0928   CREATININE 1.69 (H) 01/13/2022 1125   CREATININE 1.3 (H) 01/12/2017 0947   CALCIUM  9.3 03/30/2023 0928   CALCIUM  9.3 01/12/2017 0947   PROT 7.1 01/13/2022 1125   PROT 7.3 01/05/2015 0815   ALBUMIN 4.1 11/15/2017 1032   ALBUMIN 4.0 01/05/2015 0815   AST 13 01/13/2022 1125   AST 16 01/05/2015 0815   ALT 8 01/13/2022 1125   ALT 9 01/05/2015 0815   ALKPHOS 79 11/15/2017 1032   ALKPHOS 108 01/05/2015 0815   BILITOT 0.4 01/13/2022 1125   BILITOT <0.30 01/05/2015 0815   GFRNONAA 34 (L) 03/30/2023 0928   GFRAA 43 (L) 10/01/2019 0839     Assessment/Plan:   Assessment and Plan Assessment & Plan    Stage III Colon cancer (adenocarcinoma of cecum) s/p right colectomy 12/2013 Follows with Dr. Scherrie Curt. CT 09/2022 with no recurrent/metastatic disease. Colonoscopy 12/2020 with patent ileo-colonic anastomosis and 7 mm TA polyp in transverse colon with recall 3 years (12/2023)    Gail Erp, PA-C [facility] 06/05/2023, 11:54 AM  Cc: Charle Congo, MD

## 2023-06-06 ENCOUNTER — Encounter: Payer: Self-pay | Admitting: Gastroenterology

## 2023-06-06 ENCOUNTER — Ambulatory Visit: Admitting: Gastroenterology

## 2023-06-06 VITALS — BP 130/80 | HR 62 | Ht 63.0 in | Wt 166.0 lb

## 2023-06-06 DIAGNOSIS — K649 Unspecified hemorrhoids: Secondary | ICD-10-CM

## 2023-06-06 DIAGNOSIS — K625 Hemorrhage of anus and rectum: Secondary | ICD-10-CM | POA: Diagnosis not present

## 2023-06-06 DIAGNOSIS — C189 Malignant neoplasm of colon, unspecified: Secondary | ICD-10-CM

## 2023-06-06 MED ORDER — HYDROCORTISONE (PERIANAL) 2.5 % EX CREA: 1.0000 | TOPICAL_CREAM | Freq: Two times a day (BID) | CUTANEOUS | 0 refills | Status: DC

## 2023-06-06 NOTE — Patient Instructions (Addendum)
 We have sent the following medications to your pharmacy for you to pick up at your convenience: Hydrocortisone  cream use twice daily.   Follow up in August/September.  _______________________________________________________  If your blood pressure at your visit was 140/90 or greater, please contact your primary care physician to follow up on this.  _______________________________________________________  If you are age 75 or older, your body mass index should be between 23-30. Your Body mass index is 29.41 kg/m. If this is out of the aforementioned range listed, please consider follow up with your Primary Care Provider.  If you are age 52 or younger, your body mass index should be between 19-25. Your Body mass index is 29.41 kg/m. If this is out of the aformentioned range listed, please consider follow up with your Primary Care Provider.   ________________________________________________________  The [facility] providers would like to encourage you to use MYCHART to communicate with providers for non-urgent requests or questions.  Due to long hold times on the telephone, sending your provider a message by [facility] may be a faster and more efficient way to get a response.  Please allow 48 business hours for a response.  Please remember that this is for non-urgent requests.  _______________________________________________________

## 2023-06-12 NOTE — Progress Notes (Signed)
 Addendum: Reviewed and agree with assessment and management plan. Asha Grumbine, Carie Caddy, MD

## 2023-06-29 ENCOUNTER — Telehealth: Payer: Self-pay | Admitting: *Deleted

## 2023-06-29 MED ORDER — FAMOTIDINE 40 MG PO TABS: 40.0000 mg | ORAL_TABLET | Freq: Two times a day (BID) | ORAL | 11 refills | Status: AC

## 2023-06-29 NOTE — Telephone Encounter (Signed)
 Previous Gail Carlson patient wants refills of her Famotidine . Patient last seen Bayley McMicheal on 06/06/2023. Will send patients refills electronically.....   Sent refills under [facility] name

## 2023-08-10 ENCOUNTER — Telehealth: Payer: Self-pay | Admitting: *Deleted

## 2023-08-10 NOTE — Telephone Encounter (Signed)
 Gail Carlson with her 8/28 appointments for lab/scan and MD visit. She reports she has appointment on 8/1 for COVID booster. Instructed her to wait and have that done after the CT in case is causes some lymph node enlargement. She understands and agrees.

## 2023-08-29 ENCOUNTER — Other Ambulatory Visit: Payer: Self-pay | Admitting: Internal Medicine

## 2023-08-29 DIAGNOSIS — Z1231 Encounter for screening mammogram for malignant neoplasm of breast: Secondary | ICD-10-CM

## 2023-09-07 ENCOUNTER — Encounter: Payer: Self-pay | Admitting: *Deleted

## 2023-09-28 ENCOUNTER — Ambulatory Visit (HOSPITAL_BASED_OUTPATIENT_CLINIC_OR_DEPARTMENT_OTHER)
Admission: RE | Admit: 2023-09-28 | Discharge: 2023-09-28 | Disposition: A | Discharge status: Home / Self Care | Source: Ambulatory Visit | Attending: Oncology | Admitting: Oncology

## 2023-09-28 ENCOUNTER — Inpatient Hospital Stay

## 2023-09-28 ENCOUNTER — Inpatient Hospital Stay: Payer: Medicare Other | Attending: Oncology | Admitting: Oncology

## 2023-09-28 VITALS — BP 165/82 | HR 88 | Temp 97.8°F | Resp 18 | Ht 63.0 in | Wt 162.7 lb

## 2023-09-28 DIAGNOSIS — C189 Malignant neoplasm of colon, unspecified: Secondary | ICD-10-CM | POA: Insufficient documentation

## 2023-09-28 DIAGNOSIS — K649 Unspecified hemorrhoids: Secondary | ICD-10-CM | POA: Diagnosis not present

## 2023-09-28 DIAGNOSIS — Z803 Family history of malignant neoplasm of breast: Secondary | ICD-10-CM | POA: Diagnosis not present

## 2023-09-28 DIAGNOSIS — Z85038 Personal history of other malignant neoplasm of large intestine: Secondary | ICD-10-CM | POA: Diagnosis present

## 2023-09-28 DIAGNOSIS — I10 Essential (primary) hypertension: Secondary | ICD-10-CM | POA: Diagnosis not present

## 2023-09-28 DIAGNOSIS — D6959 Other secondary thrombocytopenia: Secondary | ICD-10-CM | POA: Insufficient documentation

## 2023-09-28 DIAGNOSIS — G620 Drug-induced polyneuropathy: Secondary | ICD-10-CM | POA: Insufficient documentation

## 2023-09-28 DIAGNOSIS — T451X5A Adverse effect of antineoplastic and immunosuppressive drugs, initial encounter: Secondary | ICD-10-CM | POA: Insufficient documentation

## 2023-09-28 LAB — CBC WITH DIFFERENTIAL (CANCER CENTER ONLY)
Abs Immature Granulocytes: 0.01 K/uL (ref 0.00–0.07)
Basophils Absolute: 0.1 K/uL (ref 0.0–0.1)
Basophils Relative: 1 %
Eosinophils Absolute: 0.1 K/uL (ref 0.0–0.5)
Eosinophils Relative: 3 %
HCT: 50.5 % — ABNORMAL HIGH (ref 36.0–46.0)
Hemoglobin: 16.3 g/dL — ABNORMAL HIGH (ref 12.0–15.0)
Immature Granulocytes: 0 %
Lymphocytes Relative: 28 %
Lymphs Abs: 1.4 K/uL (ref 0.7–4.0)
MCH: 30.4 pg (ref 26.0–34.0)
MCHC: 32.3 g/dL (ref 30.0–36.0)
MCV: 94.2 fL (ref 80.0–100.0)
Monocytes Absolute: 0.5 K/uL (ref 0.1–1.0)
Monocytes Relative: 9 %
Neutro Abs: 3.1 K/uL (ref 1.7–7.7)
Neutrophils Relative %: 59 %
Platelet Count: 229 K/uL (ref 150–400)
RBC: 5.36 MIL/uL — ABNORMAL HIGH (ref 3.87–5.11)
RDW: 13.2 % (ref 11.5–15.5)
WBC Count: 5.2 K/uL (ref 4.0–10.5)
nRBC: 0 % (ref 0.0–0.2)

## 2023-09-28 LAB — BASIC METABOLIC PANEL - CANCER CENTER ONLY
Anion gap: 11 (ref 5–15)
BUN: 21 mg/dL (ref 8–23)
CO2: 24 mmol/L (ref 22–32)
Calcium: 9.6 mg/dL (ref 8.9–10.3)
Chloride: 106 mmol/L (ref 98–111)
Creatinine: 1.84 mg/dL — ABNORMAL HIGH (ref 0.44–1.00)
GFR, Estimated: 28 mL/min — ABNORMAL LOW (ref >60)
Glucose, Bld: 82 mg/dL (ref 70–99)
Potassium: 3.7 mmol/L (ref 3.5–5.1)
Sodium: 142 mmol/L (ref 135–145)

## 2023-09-28 LAB — CEA (ACCESS): CEA (CHCC): 3.56 ng/mL (ref 0.00–5.00)

## 2023-09-28 NOTE — Progress Notes (Signed)
 [facility] OFFICE PROGRESS NOTE   Diagnosis: Colon cancer  INTERVAL HISTORY:   Ms. Moritz returns as scheduled.  She feels well.  Good appetite.  She has occasional rectal bleeding.  She is scheduled for a colonoscopy in November.  She smokes approximately 2 cigarettes/day.  Objective:  Vital signs in last 24 hours:  Blood pressure (!) 165/82, pulse 88, temperature 97.8 F (36.6 C), temperature source Temporal, resp. rate 18, height 5' 3 (1.6 m), weight 162 lb 11.2 oz (73.8 kg), SpO2 98%.   Lymphatics: No cervical, supraclavicular, axillary, or inguinal nodes Resp: Lungs clear bilaterally Cardio: Regular rate and rhythm GI: No mass, nontender, no hepatosplenomegaly Vascular: No leg edema   Lab Results:  Lab Results  Component Value Date   WBC 5.2 09/28/2023   HGB 16.3 (H) 09/28/2023   HCT 50.5 (H) 09/28/2023   MCV 94.2 09/28/2023   PLT 229 09/28/2023   NEUTROABS 3.1 09/28/2023    CMP  Lab Results  Component Value Date   NA 142 09/28/2023   K 3.7 09/28/2023   CL 106 09/28/2023   CO2 24 09/28/2023   GLUCOSE 82 09/28/2023   BUN 21 09/28/2023   CREATININE 1.84 (H) 09/28/2023   CALCIUM  9.6 09/28/2023   PROT 7.1 01/13/2022   ALBUMIN 4.1 11/15/2017   AST 13 01/13/2022   ALT 8 01/13/2022   ALKPHOS 79 11/15/2017   BILITOT 0.4 01/13/2022   GFRNONAA 28 (L) 09/28/2023   GFRAA 43 (L) 10/01/2019    Lab Results  Component Value Date   CEA1 3.59 09/23/2020   CEA 3.56 09/28/2023    Medications: I have reviewed the patient's current medications.   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 CTs of the chest, abdomen, and pelvis on 01/10/2014 with a cecal mass, nonspecific small liver lesions, and nonspecific lung nodules felt to most likely represent mucoid  impaction Cycle 1 FOLFOX 02/20/2014 Cycle 2 FOLFOX 03/06/2014 Cycle 3 FOLFOX 03/20/2014 Cycle 4 FOLFOX 04/03/2014 Cycle 5 FOLFOX held on 04/17/2014 due to neutropenia Cycle 5 FOLFOX 04/24/2014 with Neulasta  support Cycle 6 FOLFOX 05/08/2014 Cycle 7 FOLFOX 05/22/2014 with Neulasta  support Cycle 8 FOLFOX 06/05/2014 (oxaliplatin  held secondary to thrombocytopenia) Cycle 9 FOLFOX 06/19/2014 with Neulasta  support Cycle 10 FOLFOX 07/03/2014 (oxaliplatin  held secondary to thrombocytopenia) Cycle 11 FOLFOX 07/17/2014 Cycle 12 FOLFOX 07/31/2014 (oxaliplatin  dose reduced due to thrombocytopenia CTs chest, abdomen, and pelvis on 01/05/2015-no evidence of recurrent disease Colonoscopy 09/15/2015-tubular adenoma removed from the transverse colon CTs chest, abdomen, pelvis on 01/14/2016-no evidence of recurrent disease CTs 01/12/2017-no evidence of recurrent cancer. Colonoscopy 10/25/2017-1 cm deep firm ulceration on the colonic side distal aspect of the anastomosis.  Pathology-adenocarcinoma CEA 11/15/2017-2.2 CT abdomen/pelvis 11/16/2017- no evidence of recurrent or metastatic carcinoma.  Stable small hiatal hernia and periumbilical ventral hernia.  Stable benign bilateral adrenal lesions. Resection of anastomosis 01/11/2018-adenocarcinoma, recurrent, moderately differentiated (2 cm) involving the prior anastomotic site; tumor invades muscularis propria; no carcinoma identified in 7 lymph nodes.  Margins negative. CTs 08/14/2018-masslike collection of fluid in the subcutaneous tissue of the umbilicus Ultrasound-guided aspiration of anterior abdominal wall complex fluid collection 09/07/2018, negative cytology Colonoscopy 10/30/2018-superficial ulceration at the ileocolonic anastomosis, biopsy negative for malignancy; repeat colonoscopy in 2 years for surveillance CTs 09/24/2019-recurrent collection at the site of the previous subcutaneous fluid collection CTs 09/23/2020-resolution of seroma at the anterior  abdominal  wall, no evidence of recurrent disease Colonoscopy 12/28/2020-polyp in the transverse colon removed-tubular adenoma CTs 09/27/2021-no evidence of recurrent or metastatic disease CTs 09/27/2022-no evidence of recurrent or metastatic disease    History of Iron deficiency anemia Family history of breast cancer Port-A-Cath placement 02/18/2014 Delayed nausea following cycle 1 FOLFOX. Aloxi  and Emend added beginning with cycle 2. Prophylactic Decadron  added beginning with cycle 3. Neutropenia following cycle 4 FOLFOX. Neulasta  was added with cycle 5. Hypokalemia 04/17/2014. Question secondary to diarrhea. Potassium supplement initiated. Left upper arm pain. Negative venous Doppler 03/07/2014. She was referred to orthopedics. 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  Pathology on seroma cavity, abdominal wall excision-benign fibrous tissue with adherent fibrinous debris.      Disposition: Gail Carlson is in clinical remission from colon cancer.  She underwent surveillance CTs today.  The final report is not available.  There is no evidence of recurrent disease on my review of the images today.  We will follow-up on the final report.  She will return for an office visit and CEA in 6 months.  She has been evaluated by gastroenterology for rectal bleeding and was found to have hemorrhoids.  She is scheduling a colonoscopy for within the next few months.  Arley Hof, MD  09/28/2023  11:43 AM

## 2023-10-06 ENCOUNTER — Ambulatory Visit

## 2023-10-09 ENCOUNTER — Telehealth: Payer: Self-pay | Admitting: *Deleted

## 2023-10-09 ENCOUNTER — Other Ambulatory Visit (HOSPITAL_BASED_OUTPATIENT_CLINIC_OR_DEPARTMENT_OTHER): Payer: Self-pay

## 2023-10-09 ENCOUNTER — Ambulatory Visit: Payer: Self-pay | Admitting: Oncology

## 2023-10-09 MED ORDER — COVID-19 MRNA VAC-TRIS(PFIZER) 30 MCG/0.3ML IM SUSY
0.3000 mL | PREFILLED_SYRINGE | Freq: Once | INTRAMUSCULAR | 0 refills | Status: DC
  Filled 2023-10-09: qty 0.3, 1d supply, fill #0

## 2023-10-09 MED ORDER — COVID-19 MRNA VAC-TRIS(PFIZER) 30 MCG/0.3ML IM SUSY: 0.3000 mL | PREFILLED_SYRINGE | Freq: Once | INTRAMUSCULAR | 0 refills | Status: AC

## 2023-10-09 NOTE — Telephone Encounter (Signed)
 Patient called to report that [facility] requires a script be sent in for her to receive COVID booster. This was confirmed w/pharmacy. Script escribed to [facility]

## 2023-10-10 NOTE — Progress Notes (Signed)
 10/10/23 @ 2:51: no answer

## 2023-10-12 ENCOUNTER — Other Ambulatory Visit: Payer: Self-pay

## 2023-10-12 NOTE — Telephone Encounter (Signed)
-----   Message from Arley Hof sent at 10/09/2023  7:42 PM EDT ----- Please call patient , the CTs are negative for cancer, f/u as scheduled  ----- Message ----- From: Interface, Rad Results In Sent: 10/08/2023  11:11 PM EDT To: Arley KATHEE Hof, MD

## 2023-10-12 NOTE — Telephone Encounter (Signed)
 Patient gave verbal understanding and had no further questions or concerns

## 2023-10-20 ENCOUNTER — Ambulatory Visit
Admission: RE | Admit: 2023-10-20 | Discharge: 2023-10-20 | Disposition: A | Discharge status: Home / Self Care | Source: Ambulatory Visit | Attending: Internal Medicine | Admitting: Internal Medicine

## 2023-10-20 DIAGNOSIS — Z1231 Encounter for screening mammogram for malignant neoplasm of breast: Secondary | ICD-10-CM

## 2023-11-01 ENCOUNTER — Encounter: Payer: Self-pay | Admitting: Gastroenterology

## 2023-11-03 ENCOUNTER — Encounter: Payer: Self-pay | Admitting: Gastroenterology

## 2023-12-25 NOTE — Progress Notes (Unsigned)
 Chief Complaint: Follow-up Primary GI MD: Dr. Albertus  HPI: 73 year old female history of stage 3 colon cancer s/p right colectomy 12/2013 and others as listed below presents for follow-up  Last seen 06/06/2023 by myself reported intermittent rectal bleeding with history of significant straining  Gail Carlson is a 73 year old female who presents with hemorrhoids.  She has hemorrhoids. She has been using a prescribed cream which has helped reduce the hemorrhoid. Two hemorrhoids were previously removed via banding, but a third one could not be addressed. She continues to manage the hemorrhoid by applying cream and manually repositioning it when it protrudes.  She is due for a colonoscopy, which was previously set up. Her last colonoscopy in 2022 was reported as normal. She wants to proceed with the colonoscopy to further evaluate the cyst and hemorrhoids.  She tries not to strain during bowel movements and is not typically constipated. However, she sometimes experiences bleeding when passing gas before a bowel movement.  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 She expresses her efforts to guide them and instill a sense of responsibility.   PREVIOUS GI WORKUP   CTs of the chest, abdomen, and pelvis on 01/10/2014 with a cecal mass, nonspecific small liver lesions, and nonspecific lung nodules felt to most likely represent mucoid impaction  Colonoscopy 09/15/2015-tubular adenoma removed from the transverse colon CTs chest, abdomen, pelvis on 01/14/2016-no evidence of recurrent disease CTs 01/12/2017-no evidence of recurrent cancer. Colonoscopy 10/25/2017-1 cm deep firm ulceration on the colonic side distal aspect of the anastomosis.  Pathology-adenocarcinoma CEA 11/15/2017-2.2 CT abdomen/pelvis 11/16/2017- no evidence of recurrent or metastatic carcinoma.  Stable small hiatal hernia and  periumbilical ventral hernia.  Stable benign bilateral adrenal lesions. Resection of anastomosis 01/11/2018-adenocarcinoma, recurrent, moderately differentiated (2 cm) involving the prior anastomotic site; tumor invades muscularis propria; no carcinoma identified in 7 lymph nodes.  Margins negative. CTs 08/14/2018-masslike collection of fluid in the subcutaneous tissue of the umbilicus Ultrasound-guided aspiration of anterior abdominal wall complex fluid collection 09/07/2018, negative cytology Colonoscopy 10/30/2018-superficial ulceration at the ileocolonic anastomosis, biopsy negative for malignancy; repeat colonoscopy in 2 years for surveillance CTs 09/24/2019-recurrent collection at the site of the previous subcutaneous fluid collection CTs 09/23/2020-resolution of seroma at the anterior abdominal wall, no evidence of recurrent disease Colonoscopy 12/28/2020-polyp in the transverse colon removed-tubular adenoma CTs 09/27/2021-no evidence of recurrent or metastatic disease CTs 09/27/2022-no evidence of recurrent or metastatic disease  Past Medical History:  Diagnosis Date   Allergy    Anemia    Anxiety    Arthritis    shoulders- oxycodone  prn    Colon cancer (HCC)    colon cancer   2013   Constipation    uses laxative prn    Depression    Diabetes mellitus without complication (HCC)    Family history of breast cancer    GERD (gastroesophageal reflux disease)    no meds   History of chemotherapy 2015   History of radiation therapy 2015   Hypertension    Insomnia    Nausea with vomiting 03/13/2014   Neuromuscular disorder (HCC)    hernia   Neuropathy    under the heel part of foot    Renal insufficiency     Past Surgical History:  Procedure Laterality Date   APPENDECTOMY     COLON RESECTION N/A 01/11/2018   Procedure: OPEN COLON RESECTION;  Surgeon: Eletha,  Krystal, MD;  Location: [facility];  Service: General;  Laterality: N/A;   COLON SURGERY     COLONOSCOPY N/A 01/10/2014    Procedure: COLONOSCOPY;  Surgeon: Norleen LOISE Kiang, MD;  Location: [facility];  Service: Endoscopy;  Laterality: N/A;   COLONOSCOPY     ESOPHAGOGASTRODUODENOSCOPY N/A 01/10/2014   Procedure: ESOPHAGOGASTRODUODENOSCOPY (EGD);  Surgeon: Norleen LOISE Kiang, MD;  Location: [facility];  Service: Endoscopy;  Laterality: N/A;   HERNIA REPAIR     INCISION AND DRAINAGE ABSCESS N/A 05/09/2019   Procedure: EXCISION AND DRAINAGE OF CHRONIC SEROMA;  Surgeon: Eletha Krystal, MD;  Location: [facility];  Service: General;  Laterality: N/A;   INCISIONAL HERNIA REPAIR N/A 01/11/2018   Procedure: HERNIA REPAIR INCISIONAL;  Surgeon: Eletha Krystal, MD;  Location: [facility];  Service: General;  Laterality: N/A;   LAPAROTOMY N/A 01/11/2014   Procedure: EXPLORATORY LAPAROTOMY;  Surgeon: Krystal Eletha, MD;  Location: [facility];  Service: General;  Laterality: N/A;   PARTIAL COLECTOMY N/A 01/11/2014   Procedure: PARTIAL COLECTOMY;  Surgeon: Krystal Eletha, MD;  Location: [facility];  Service: General;  Laterality: N/A;   PORT-A-CATH REMOVAL Left 03/24/2015   Procedure: REMOVAL PORT-A-CATH;  Surgeon: Krystal Eletha, MD;  Location: [facility];  Service: General;  Laterality: Left;   PORTACATH PLACEMENT N/A 02/18/2014   Procedure: INSERTION PORT-A-CATH LEFT SUBCLAVIAN VIEN;  Surgeon: Krystal Eletha, MD;  Location: [facility];  Service: General;  Laterality: N/A;   SAVORY DILATION N/A 01/10/2014   Procedure: SAVORY DILATION;  Surgeon: Norleen LOISE Kiang, MD;  Location: [facility];  Service: Endoscopy;  Laterality: N/A;  Please verify with Kiang what type of dilation he will use   UPPER GASTROINTESTINAL ENDOSCOPY      Current Outpatient Medications  Medication Sig Dispense Refill   albuterol  (VENTOLIN  HFA) 108 (90 Base) MCG/ACT inhaler Inhale 1-2 puffs into the lungs every 4 (four) hours as needed.     amLODipine  (NORVASC ) 10 MG tablet Take 1 tablet (10 mg total) by mouth daily. 30 tablet 1   calcium  carbonate (TUMS - DOSED IN MG  ELEMENTAL CALCIUM ) 500 MG chewable tablet Chew 1 tablet by mouth daily.     cholecalciferol (VITAMIN D3) 25 MCG (1000 UNIT) tablet Take 1,000 Units by mouth daily.     citalopram  (CELEXA ) 10 MG tablet Take 10 mg by mouth every evening.      famotidine  (PEPCID ) 40 MG tablet Take 1 tablet (40 mg total) by mouth 2 (two) times daily. 60 tablet 11   ferrous sulfate  325 (65 FE) MG EC tablet Take 325 mg by mouth every other day.     gabapentin (NEURONTIN) 100 MG capsule Take 100 mg by mouth at bedtime.     hydrocortisone  (ANUSOL -HC) 2.5 % rectal cream Place 1 Application rectally 2 (two) times daily. 42 g 0   Ibuprofen-diphenhydrAMINE  HCl (ADVIL PM) 200-25 MG CAPS Take 2 capsules by mouth at bedtime as needed (pain/sleep).      lisinopril -hydrochlorothiazide  (PRINZIDE ,ZESTORETIC ) 20-12.5 MG tablet Take 1 tablet by mouth every evening.   0   Multiple Vitamins-Iron (MULTIVITAMINS WITH IRON) TABS tablet Take 1 tablet by mouth every other day.     Na Sulfate-K Sulfate-Mg Sulfate concentrate (SUPREP) 17.5-3.13-1.6 GM/177ML SOLN Take 1 kit (354 mLs total) by mouth once for 1 dose. 354 mL 0   tiZANidine (ZANAFLEX) 4 MG tablet Take 4 mg by mouth daily.     traZODone (DESYREL) 100 MG tablet Take 100 mg by mouth at bedtime.  No current facility-administered medications for this visit.   Facility-Administered Medications Ordered in Other Visits  Medication Dose Route Frequency Provider Last Rate Last Admin   sodium chloride  0.9 % injection 10 mL  10 mL Intravenous PRN Cloret[facility] Arley NOVAK, MD        Allergies as of 12/26/2023   (No Known Allergies)    Family History  Problem Relation Age of Onset   Breast cancer Mother        Dx 92s; deceased 65   Diabetes Mother    Diabetes Brother    Uterine cancer Maternal Aunt        Dx 61s; deceased 71s   Breast cancer Cousin        daughter of mat aunt w/ uterine ca   Breast cancer Cousin        daughter of mat aunt w/ uterine ca   Breast cancer Cousin         daughter of mat aunt w/ uterine ca   Colon cancer Neg Hx    Colon polyps Neg Hx    Rectal cancer Neg Hx    Stomach cancer Neg Hx    Esophageal cancer Neg Hx     Social History   Socioeconomic History   Marital status: Widowed    Spouse name: Not on file   Number of children: 2   Years of education: Not on file   Highest education level: Not on file  Occupational History   Occupation: Retired  Tobacco Use   Smoking status: Some Days    Current packs/day: 0.25    Types: Cigarettes   Smokeless tobacco: Never   Tobacco comments:    1 pack will last > 1 week   Vaping Use   Vaping status: Never Used  Substance and Sexual Activity   Alcohol use: Yes    Alcohol/week: 5.0 standard drinks of alcohol    Types: 2 Cans of beer, 3 Shots of liquor per week    Comment: occasional once a month   Drug use: Yes    Types: Marijuana    Comment: occasional   Sexual activity: Not Currently  Other Topics Concern   Not on file  Social History Narrative   Not on file   Social Drivers of Health   Financial Resource Strain: Not on file  Food Insecurity: Not on file  Transportation Needs: Not on file  Physical Activity: Not on file  Stress: Not on file  Social Connections: Not on file  Intimate Partner Violence: Not on file    Review of Systems:    Constitutional: No weight loss, fever, chills, weakness or fatigue HEENT: Eyes: No change in vision               Ears, Nose, Throat:  No change in hearing or congestion Skin: No rash or itching Cardiovascular: No chest pain, chest pressure or palpitations   Respiratory: No SOB or cough Gastrointestinal: See HPI and otherwise negative Genitourinary: No dysuria or change in urinary frequency Neurological: No headache, dizziness or syncope Musculoskeletal: No new muscle or joint pain Hematologic: No bleeding or bruising Psychiatric: No history of depression or anxiety    Physical Exam:  Vital signs: BP (!) 190/104 (BP Location: Left  Arm, Patient Position: Sitting, Cuff Size: Large)   Pulse 72   Ht 5' (1.524 m) Comment: heght measured without shoes  Wt 160 lb 8 oz (72.8 kg)   BMI 31.35 kg/m   Constitutional: NAD, alert and cooperative  Head:  Normocephalic and atraumatic. Eyes:   PEERL, EOMI. No icterus. Conjunctiva pink. Respiratory: Respirations even and unlabored. Lungs clear to auscultation bilaterally.   No wheezes, crackles, or rhonchi.  Cardiovascular:  Regular rate and rhythm. No peripheral edema, cyanosis or pallor.  Gastrointestinal:  Soft, nondistended, nontender. No rebound or guarding. Normal bowel sounds. No appreciable masses or hepatomegaly. Msk:  Symmetrical without gross deformities. Without edema, no deformity or joint abnormality.  Neurologic:  Alert and  oriented x4;  grossly normal neurologically.  Skin:   Dry and intact without significant lesions or rashes. Psychiatric: Oriented to person, place and time. Demonstrates good judgement and reason without abnormal affect or behaviors.   RELEVANT LABS AND IMAGING: CBC    Component Value Date/Time   WBC 5.2 09/28/2023 0818   WBC 5.2 01/13/2022 1125   RBC 5.36 (H) 09/28/2023 0818   HGB 16.3 (H) 09/28/2023 0818   HGB 13.8 01/12/2017 0947   HCT 50.5 (H) 09/28/2023 0818   HCT 42.2 01/12/2017 0947   PLT 229 09/28/2023 0818   PLT 248 01/12/2017 0947   MCV 94.2 09/28/2023 0818   MCV 89.8 01/12/2017 0947   MCH 30.4 09/28/2023 0818   MCHC 32.3 09/28/2023 0818   RDW 13.2 09/28/2023 0818   RDW 14.3 01/12/2017 0947   LYMPHSABS 1.4 09/28/2023 0818   LYMPHSABS 1.2 01/12/2017 0947   MONOABS 0.5 09/28/2023 0818   MONOABS 0.3 01/12/2017 0947   EOSABS 0.1 09/28/2023 0818   EOSABS 0.1 01/12/2017 0947   BASOSABS 0.1 09/28/2023 0818   BASOSABS 0.1 01/12/2017 0947    CMP     Component Value Date/Time   NA 142 09/28/2023 0818   NA 140 01/12/2017 0947   K 3.7 09/28/2023 0818   K 4.0 01/12/2017 0947   CL 106 09/28/2023 0818   CO2 24 09/28/2023 0818    CO2 28 01/12/2017 0947   GLUCOSE 82 09/28/2023 0818   GLUCOSE 88 01/12/2017 0947   BUN 21 09/28/2023 0818   BUN 11.4 01/12/2017 0947   CREATININE 1.84 (H) 09/28/2023 0818   CREATININE 1.69 (H) 01/13/2022 1125   CREATININE 1.3 (H) 01/12/2017 0947   CALCIUM  9.6 09/28/2023 0818   CALCIUM  9.3 01/12/2017 0947   PROT 7.1 01/13/2022 1125   PROT 7.3 01/05/2015 0815   ALBUMIN 4.1 11/15/2017 1032   ALBUMIN 4.0 01/05/2015 0815   AST 13 01/13/2022 1125   AST 16 01/05/2015 0815   ALT 8 01/13/2022 1125   ALT 9 01/05/2015 0815   ALKPHOS 79 11/15/2017 1032   ALKPHOS 108 01/05/2015 0815   BILITOT 0.4 01/13/2022 1125   BILITOT <0.30 01/05/2015 0815   GFRNONAA 28 (L) 09/28/2023 0818   GFRAA 43 (L) 10/01/2019 0839     Assessment/Plan:   Stage III Colon cancer (adenocarcinoma of cecum) s/p right colectomy 12/2013 Follows with Dr. Cloretta. CT 09/2022 with no recurrent/metastatic disease (upcoming repeat CT to be scheduled). Colonoscopy 12/2020 with patent ileo-colonic anastomosis and 7 mm TA polyp in transverse colon with recall 3 years (12/2023). -- Schedule colonoscopy - I thoroughly discussed the procedure with the patient (at bedside) to include nature of the procedure, alternatives, benefits, and risks (including but not limited to bleeding, infection, perforation, anesthesia/cardiac pulmonary complications).  Patient verbalized understanding and gave verbal consent to proceed with procedure.   Rectal bleeding Hemorrhoids Hemorrhoids on previous rectal exam.  Patient declined suppositories but was amenable to hydrocortisone  cream twice daily for 14 days.  And recommended increasing fiber, which patient declined.SABRA  Recommended colonoscopy but patient declined and preferred to wait until her due date which is November 2025. Improvement in hemorrhoids via measures above - After colonoscopy consider repeat internal hemorrhoid banding - Continue to use cream/suppositories as needed - Increase  water, increase fiber, increase exercise  Hypertension Blood pressure elevated today at 210/100 with repeat 190/104.  Patient states her home blood pressures are significantly better than this since in the 140s, she just felt stressed from her grandchildren earlier today and getting to her appointment. - Follow-up with PCP regarding blood pressure - If high blood pressures at home please let us  know as we will need to cancel the colonoscopy - Educated to proceed to ED if headache, dizziness, chest pain, shortness of breath   Batu Cassin Mollie RIGGERS [facility] 12/26/2023, 10:23 AM  Cc: Shelda Atlas, MD

## 2023-12-26 ENCOUNTER — Ambulatory Visit: Admitting: Gastroenterology

## 2023-12-26 ENCOUNTER — Encounter: Payer: Self-pay | Admitting: Gastroenterology

## 2023-12-26 VITALS — BP 190/104 | HR 72 | Ht 60.0 in | Wt 160.5 lb

## 2023-12-26 DIAGNOSIS — K649 Unspecified hemorrhoids: Secondary | ICD-10-CM | POA: Diagnosis not present

## 2023-12-26 DIAGNOSIS — C189 Malignant neoplasm of colon, unspecified: Secondary | ICD-10-CM

## 2023-12-26 DIAGNOSIS — K625 Hemorrhage of anus and rectum: Secondary | ICD-10-CM | POA: Diagnosis not present

## 2023-12-26 DIAGNOSIS — I10 Essential (primary) hypertension: Secondary | ICD-10-CM

## 2023-12-26 DIAGNOSIS — Z85038 Personal history of other malignant neoplasm of large intestine: Secondary | ICD-10-CM

## 2023-12-26 DIAGNOSIS — Z860100 Personal history of colon polyps, unspecified: Secondary | ICD-10-CM | POA: Diagnosis not present

## 2023-12-26 MED ORDER — NA SULFATE-K SULFATE-MG SULF 17.5-3.13-1.6 GM/177ML PO SOLN: 1.0000 | Freq: Once | ORAL | 0 refills | Status: AC

## 2023-12-26 NOTE — Patient Instructions (Signed)
 You have been scheduled for a colonoscopy. Please follow written instructions given to you at your visit today.   If you use inhalers (even only as needed), please bring them with you on the day of your procedure.  DO NOT TAKE 7 DAYS PRIOR TO TEST- Trulicity (dulaglutide) Ozempic, Wegovy (semaglutide) Mounjaro, Zepbound (tirzepatide) Bydureon Bcise (exanatide extended release)  DO NOT TAKE 1 DAY PRIOR TO YOUR TEST Rybelsus (semaglutide) Adlyxin (lixisenatide) Victoza (liraglutide) Byetta (exanatide) _____________________________________________________________    If your blood pressure at your visit was 140/90 or greater, please contact your primary care physician to follow up on this.  _______________________________________________________  If you are age 65 or older, your body mass index should be between 23-30. Your Body mass index is 31.35 kg/m. If this is out of the aforementioned range listed, please consider follow up with your Primary Care Provider.  If you are age 58 or younger, your body mass index should be between 19-25. Your Body mass index is 31.35 kg/m. If this is out of the aformentioned range listed, please consider follow up with your Primary Care Provider.   ________________________________________________________  The  GI providers would like to encourage you to use MYCHART to communicate with providers for non-urgent requests or questions.  Due to long hold times on the telephone, sending your provider a message by [facility] may be a faster and more efficient way to get a response.  Please allow 48 business hours for a response.  Please remember that this is for non-urgent requests.  _______________________________________________________  [facility] is using a team-based approach to care.  Your team is made up of your doctor and two to three APPS. Our APPS (Nurse Practitioners and Physician Assistants) work with your physician to ensure care  continuity for you. They are fully qualified to address your health concerns and develop a treatment plan. They communicate directly with your gastroenterologist to care for you. Seeing the Advanced Practice Practitioners on your physician's team can help you by facilitating care more promptly, often allowing for earlier appointments, access to diagnostic testing, procedures, and other specialty referrals.

## 2024-01-04 ENCOUNTER — Telehealth: Payer: Self-pay | Admitting: Gastroenterology

## 2024-01-04 NOTE — Telephone Encounter (Signed)
 Patient requesting new prep medication due to cost. Please advise.   Thank you

## 2024-01-04 NOTE — Telephone Encounter (Signed)
 Patient states she cannot afford Suprep. Patient is requesting something over the counter. Offered patient over the counter Miralax  prep. Patient agreed. Informed patient that I will mail her new prep instructions and if she does not receive them in the mail to please contact our office. Patient verbalized understanding. New prep instructions mailed.

## 2024-01-12 ENCOUNTER — Telehealth: Payer: Self-pay | Admitting: Gastroenterology

## 2024-01-12 DIAGNOSIS — K649 Unspecified hemorrhoids: Secondary | ICD-10-CM

## 2024-01-12 MED ORDER — HYDROCORTISONE (PERIANAL) 2.5 % EX CREA: 1.0000 | TOPICAL_CREAM | Freq: Two times a day (BID) | CUTANEOUS | 0 refills | Status: AC

## 2024-01-12 NOTE — Telephone Encounter (Signed)
 Spoke to patient. Patient states she asked the pharmacy to fill her Hydrocortisone  cream however we have not received any requests. Patient informed her prescription would be sent to [facility] on [address]. Prescription sent.

## 2024-01-12 NOTE — Telephone Encounter (Signed)
 Inbound call from patient stating that she was prescribed by [facility] a hydrocortisone  cream for her anus. Patient is wanting to know why he insurance denied her that medication. Patient is requesting a call back. Please advise.

## 2024-02-16 ENCOUNTER — Encounter: Payer: Self-pay | Admitting: Internal Medicine

## 2024-02-23 ENCOUNTER — Encounter: Payer: Self-pay | Admitting: Internal Medicine

## 2024-02-23 ENCOUNTER — Ambulatory Visit: Admitting: Internal Medicine

## 2024-02-23 VITALS — BP 178/91 | HR 63 | Temp 97.9°F | Resp 13 | Ht 60.0 in | Wt 160.0 lb

## 2024-02-23 DIAGNOSIS — Z860101 Personal history of adenomatous and serrated colon polyps: Secondary | ICD-10-CM

## 2024-02-23 DIAGNOSIS — Z1211 Encounter for screening for malignant neoplasm of colon: Secondary | ICD-10-CM | POA: Diagnosis not present

## 2024-02-23 DIAGNOSIS — Z85038 Personal history of other malignant neoplasm of large intestine: Secondary | ICD-10-CM | POA: Diagnosis not present

## 2024-02-23 DIAGNOSIS — K644 Residual hemorrhoidal skin tags: Secondary | ICD-10-CM | POA: Diagnosis not present

## 2024-02-23 DIAGNOSIS — K648 Other hemorrhoids: Secondary | ICD-10-CM

## 2024-02-23 DIAGNOSIS — D123 Benign neoplasm of transverse colon: Secondary | ICD-10-CM

## 2024-02-23 MED ORDER — SODIUM CHLORIDE 0.9 % IV SOLN: 500.0000 mL | Freq: Once | INTRAVENOUS | Status: DC

## 2024-02-23 NOTE — Progress Notes (Signed)
 "   GASTROENTEROLOGY PROCEDURE H&P NOTE   Primary Care Physician: Shelda Atlas, MD    Reason for Procedure:  Personal colon cancer  Plan:    Colonoscopy  Patient is appropriate for endoscopic procedure(s) in the ambulatory ([facility]) setting.  The nature of the procedure, as well as the risks, benefits, and alternatives were carefully and thoroughly reviewed with the patient. Ample time for discussion and questions allowed.  All questions were answered. The patient understood, was satisfied, and agreed with the plan to proceed.    HPI: Gail Carlson is a 74 y.o. female who presents for surveillance colonoscopy.  Medical history as below.  Tolerated the prep.  No recent chest pain or shortness of breath.  No abdominal pain today.  Past Medical History:  Diagnosis Date   Allergy    Anemia    Anxiety    Arthritis    shoulders- oxycodone  prn    Colon cancer (HCC)    colon cancer   2013   Constipation    uses laxative prn    Depression    Diabetes mellitus without complication (HCC)    Family history of breast cancer    GERD (gastroesophageal reflux disease)    no meds   History of chemotherapy 2015   History of radiation therapy 2015   Hypertension    Insomnia    Nausea with vomiting 03/13/2014   Neuromuscular disorder (HCC)    hernia   Neuropathy    under the heel part of foot    Renal insufficiency     Past Surgical History:  Procedure Laterality Date   APPENDECTOMY     COLON RESECTION N/A 01/11/2018   Procedure: OPEN COLON RESECTION;  Surgeon: Eletha Boas, MD;  Location: [facility];  Service: General;  Laterality: N/A;   COLON SURGERY     COLONOSCOPY N/A 01/10/2014   Procedure: COLONOSCOPY;  Surgeon: Norleen LOISE Kiang, MD;  Location: [facility];  Service: Endoscopy;  Laterality: N/A;   COLONOSCOPY     ESOPHAGOGASTRODUODENOSCOPY N/A 01/10/2014   Procedure: ESOPHAGOGASTRODUODENOSCOPY (EGD);  Surgeon: Norleen LOISE Kiang, MD;  Location: [facility];  Service: Endoscopy;   Laterality: N/A;   HERNIA REPAIR     INCISION AND DRAINAGE ABSCESS N/A 05/09/2019   Procedure: EXCISION AND DRAINAGE OF CHRONIC SEROMA;  Surgeon: Eletha Boas, MD;  Location: [facility];  Service: General;  Laterality: N/A;   INCISIONAL HERNIA REPAIR N/A 01/11/2018   Procedure: HERNIA REPAIR INCISIONAL;  Surgeon: Eletha Boas, MD;  Location: [facility];  Service: General;  Laterality: N/A;   LAPAROTOMY N/A 01/11/2014   Procedure: EXPLORATORY LAPAROTOMY;  Surgeon: Boas Eletha, MD;  Location: [facility];  Service: General;  Laterality: N/A;   PARTIAL COLECTOMY N/A 01/11/2014   Procedure: PARTIAL COLECTOMY;  Surgeon: Boas Eletha, MD;  Location: [facility];  Service: General;  Laterality: N/A;   PORT-A-CATH REMOVAL Left 03/24/2015   Procedure: REMOVAL PORT-A-CATH;  Surgeon: Boas Eletha, MD;  Location: [facility];  Service: General;  Laterality: Left;   PORTACATH PLACEMENT N/A 02/18/2014   Procedure: INSERTION PORT-A-CATH LEFT SUBCLAVIAN VIEN;  Surgeon: Boas Eletha, MD;  Location: [facility];  Service: General;  Laterality: N/A;   SAVORY DILATION N/A 01/10/2014   Procedure: SAVORY DILATION;  Surgeon: Norleen LOISE Kiang, MD;  Location: [facility];  Service: Endoscopy;  Laterality: N/A;  Please verify with Kiang what type of dilation he will use   UPPER GASTROINTESTINAL ENDOSCOPY      Prior to Admission medications  Medication Sig Start Date End Date Taking? Authorizing Provider  amLODipine  (NORVASC ) 10 MG tablet Take 1 tablet (10 mg total) by mouth daily. 04/17/14  Yes Thomas, Lisa K, NP  calcium  carbonate (TUMS - DOSED IN MG ELEMENTAL CALCIUM ) 500 MG chewable tablet Chew 1 tablet by mouth daily.   Yes [provider]  cholecalciferol (VITAMIN D3) 25 MCG (1000 UNIT) tablet Take 1,000 Units by mouth daily.   Yes [provider]  citalopram  (CELEXA ) 10 MG tablet Take 10 mg by mouth every evening.    Yes [provider]  famotidine  (PEPCID ) 40 MG tablet Take 1 tablet  (40 mg total) by mouth 2 (two) times daily. 06/29/23  Yes McMichael, Bayley M, PA-C  ferrous sulfate  325 (65 FE) MG EC tablet Take 325 mg by mouth every other day.   Yes [provider]  gabapentin (NEURONTIN) 100 MG capsule Take 100 mg by mouth at bedtime. 07/25/22  Yes [provider]  hydrocortisone  (ANUSOL -HC) 2.5 % rectal cream Place 1 Application rectally 2 (two) times daily. 01/12/24  Yes McMichael, Bayley M, PA-C  Ibuprofen-diphenhydrAMINE  HCl (ADVIL PM) 200-25 MG CAPS Take 2 capsules by mouth at bedtime as needed (pain/sleep).    Yes [provider]  lisinopril -hydrochlorothiazide  (PRINZIDE ,ZESTORETIC ) 20-12.5 MG tablet Take 1 tablet by mouth every evening.    Yes [provider]  Multiple Vitamins-Iron (MULTIVITAMINS WITH IRON) TABS tablet Take 1 tablet by mouth every other day.   Yes [provider]  albuterol  (VENTOLIN  HFA) 108 (90 Base) MCG/ACT inhaler Inhale 1-2 puffs into the lungs every 4 (four) hours as needed. 11/07/23   [provider]  tiZANidine (ZANAFLEX) 4 MG tablet Take 4 mg by mouth daily. 03/05/20   [provider]  traZODone (DESYREL) 100 MG tablet Take 100 mg by mouth at bedtime. 01/31/23   [provider]    Current Outpatient Medications  Medication Sig Dispense Refill   amLODipine  (NORVASC ) 10 MG tablet Take 1 tablet (10 mg total) by mouth daily. 30 tablet 1   calcium  carbonate (TUMS - DOSED IN MG ELEMENTAL CALCIUM ) 500 MG chewable tablet Chew 1 tablet by mouth daily.     cholecalciferol (VITAMIN D3) 25 MCG (1000 UNIT) tablet Take 1,000 Units by mouth daily.     citalopram  (CELEXA ) 10 MG tablet Take 10 mg by mouth every evening.      famotidine  (PEPCID ) 40 MG tablet Take 1 tablet (40 mg total) by mouth 2 (two) times daily. 60 tablet 11   ferrous sulfate  325 (65 FE) MG EC tablet Take 325 mg by mouth every other day.     gabapentin (NEURONTIN) 100 MG capsule Take 100 mg by mouth at bedtime.      hydrocortisone  (ANUSOL -HC) 2.5 % rectal cream Place 1 Application rectally 2 (two) times daily. 30 g 0   Ibuprofen-diphenhydrAMINE  HCl (ADVIL PM) 200-25 MG CAPS Take 2 capsules by mouth at bedtime as needed (pain/sleep).      lisinopril -hydrochlorothiazide  (PRINZIDE ,ZESTORETIC ) 20-12.5 MG tablet Take 1 tablet by mouth every evening.   0   Multiple Vitamins-Iron (MULTIVITAMINS WITH IRON) TABS tablet Take 1 tablet by mouth every other day.     albuterol  (VENTOLIN  HFA) 108 (90 Base) MCG/ACT inhaler Inhale 1-2 puffs into the lungs every 4 (four) hours as needed.     tiZANidine (ZANAFLEX) 4 MG tablet Take 4 mg by mouth daily.     traZODone (DESYREL) 100 MG tablet Take 100 mg by mouth at bedtime.  Current Facility-Administered Medications  Medication Dose Route Frequency Provider Last Rate Last Admin   0.9 %  sodium chloride  infusion  500 mL Intravenous Once Lavergne Hiltunen, Gordy HERO, MD       Facility-Administered Medications Ordered in Other Visits  Medication Dose Route Frequency Provider Last Rate Last Admin   sodium chloride  0.9 % injection 10 mL  10 mL Intravenous PRN Cloretta Arley NOVAK, MD        Allergies as of 02/23/2024   (No Known Allergies)    Family History  Problem Relation Age of Onset   Breast cancer Mother        Dx 76s; deceased 28   Diabetes Mother    Diabetes Brother    Uterine cancer Maternal Aunt        Dx 68s; deceased 42s   Breast cancer Cousin        daughter of mat aunt w/ uterine ca   Breast cancer Cousin        daughter of mat aunt w/ uterine ca   Breast cancer Cousin        daughter of mat aunt w/ uterine ca   Colon cancer Neg Hx    Colon polyps Neg Hx    Rectal cancer Neg Hx    Stomach cancer Neg Hx    Esophageal cancer Neg Hx     Social History   Socioeconomic History   Marital status: Widowed    Spouse name: Not on file   Number of children: 2   Years of education: Not on file   Highest education level: Not on file  Occupational History   Occupation:  Retired  Tobacco Use   Smoking status: Some Days    Current packs/day: 0.25    Types: Cigarettes   Smokeless tobacco: Never   Tobacco comments:    1 pack will last > 1 week   Vaping Use   Vaping status: Never Used  Substance and Sexual Activity   Alcohol use: Yes    Alcohol/week: 5.0 standard drinks of alcohol    Types: 2 Cans of beer, 3 Shots of liquor per week    Comment: occasional once a month   Drug use: Yes    Types: Marijuana    Comment: last used 02/21/2024, 1 joint   Sexual activity: Not Currently  Other Topics Concern   Not on file  Social History Narrative   Not on file   Social Drivers of Health   Tobacco Use: High Risk (02/23/2024)   Patient History    Smoking Tobacco Use: Some Days    Smokeless Tobacco Use: Never    Passive Exposure: Not on file  Financial Resource Strain: Not on file  Food Insecurity: Not on file  Transportation Needs: Not on file  Physical Activity: Not on file  Stress: Not on file  Social Connections: Not on file  Intimate Partner Violence: Not on file  Depression (PHQ2-9): Low Risk (09/28/2023)   Depression (PHQ2-9)    PHQ-2 Score: 0  Alcohol Screen: Not on file  Housing: Not on file  Utilities: Not on file  Health Literacy: Not on file    Physical Exam: Vital signs in last 24 hours: @BP  (!) 185/98   Pulse 64   Temp 97.9 F (36.6 C)   Resp 13   Ht 5' (1.524 m)   Wt 160 lb (72.6 kg)   SpO2 98%   BMI 31.25 kg/m  GEN: NAD EYE: Sclerae anicteric ENT: MMM CV: Non-tachycardic Pulm:  CTA b/l GI: Soft, NT/ND NEURO:  Alert & Oriented x 3   Gordy Starch, MD [facility]  02/23/2024 10:33 AM  "

## 2024-02-23 NOTE — Progress Notes (Signed)
 Report to PACU, RN, vss, BBS= Clear.

## 2024-02-23 NOTE — Patient Instructions (Addendum)
 Resume previous diet.  Continue present medications.  Await pathology results.   Repeat colonoscopy may be recommended for surveillance in 5 years (based on patient preference, overall health and risk/benefit assessment at that time). The colonoscopy date will be determined after pathology results from today's exam become available for review.   YOU HAD AN ENDOSCOPIC PROCEDURE TODAY AT THE [facility]:   Refer to the procedure report that was given to you for any specific questions about what was found during the examination.  If the procedure report does not answer your questions, please call your gastroenterologist to clarify.  If you requested that your care partner not be given the details of your procedure findings, then the procedure report has been included in a sealed envelope for you to review at your convenience later.  YOU SHOULD EXPECT: Some feelings of bloating in the abdomen. Passage of more gas than usual.  Walking can help get rid of the air that was put into your GI tract during the procedure and reduce the bloating. If you had a lower endoscopy (such as a colonoscopy or flexible sigmoidoscopy) you may notice spotting of blood in your stool or on the toilet paper. If you underwent a bowel prep for your procedure, you may not have a normal bowel movement for a few days.  Please Note:  You might notice some irritation and congestion in your nose or some drainage.  This is from the oxygen used during your procedure.  There is no need for concern and it should clear up in a day or so.  SYMPTOMS TO REPORT IMMEDIATELY:  Following lower endoscopy (colonoscopy or flexible sigmoidoscopy):  Excessive amounts of blood in the stool  Significant tenderness or worsening of abdominal pains  Swelling of the abdomen that is new, acute  Fever of 100F or higher  For urgent or emergent issues, a gastroenterologist can be reached at any hour by calling [phone]. Do not use  MyChart messaging for urgent concerns.    DIET:  We do recommend a small meal at first, but then you may proceed to your regular diet.  Drink plenty of fluids but you should avoid alcoholic beverages for 24 hours.  ACTIVITY:  You should plan to take it easy for the rest of today and you should NOT DRIVE or use heavy machinery until tomorrow (because of the sedation medicines used during the test).    FOLLOW UP: Our staff will call the number listed on your records the next business day following your procedure.  We will call around 7:15- 8:00 am to check on you and address any questions or concerns that you may have regarding the information given to you following your procedure. If we do not reach you, we will leave a message.     If any biopsies were taken you will be contacted by phone or by letter within the next 1-3 weeks.  Please call us  at [phone] if you have not heard about the biopsies in 3 weeks.    SIGNATURES/CONFIDENTIALITY: You and/or your care partner have signed paperwork which will be entered into your electronic medical record.  These signatures attest to the fact that that the information above on your After Visit Summary has been reviewed and is understood.  Full responsibility of the confidentiality of this discharge information lies with you and/or your care-partner.

## 2024-02-23 NOTE — Op Note (Signed)
 [facility] Patient Name: Gail Carlson Procedure Date: 02/23/2024 10:27 AM MRN: [id number] Endoscopist: Gordy CHRISTELLA Starch , MD, [id number] Age: 74 Referring MD:  Date of Birth: [birth year] Gender: Female Account #: [id number] Procedure:                Colonoscopy Indications:              High risk colon cancer surveillance: Personal                            history of colon cancer (2015), Last colonoscopy:                            November 2022 (TA x 1) Medicines:                Monitored Anesthesia Care Procedure:                Pre-Anesthesia Assessment:                           - Prior to the procedure, a History and Physical                            was performed, and patient medications and                            allergies were reviewed. The patient's tolerance of                            previous anesthesia was also reviewed. The risks                            and benefits of the procedure and the sedation                            options and risks were discussed with the patient.                            All questions were answered, and informed consent                            was obtained. Prior Anticoagulants: The patient has                            taken no anticoagulant or antiplatelet agents. ASA                            Grade Assessment: II - A patient with mild systemic                            disease. After reviewing the risks and benefits,                            the patient was deemed in satisfactory condition to  undergo the procedure.                           After obtaining informed consent, the colonoscope                            was passed under direct vision. Throughout the                            procedure, the patient's blood pressure, pulse, and                            oxygen saturations were monitored continuously. The                            Olympus Scope [id number] was  introduced through the                            anus and advanced to the terminal ileum. The                            colonoscopy was performed without difficulty. The                            patient tolerated the procedure well. The quality                            of the bowel preparation was good. The terminal                            ileum and the rectum were photographed. Scope In: 10:42:20 AM Scope Out: 10:57:17 AM Scope Withdrawal Time: 0 hours 12 minutes 15 seconds  Total Procedure Duration: 0 hours 14 minutes 57 seconds  Findings:                 Skin tags were found on perianal exam.                           The neo-terminal ileum appeared normal.                           There was evidence of a prior end-to-side                            ileo-colonic anastomosis in the ascending colon.                            This was patent and was characterized by healthy                            appearing mucosa.                           A 5 mm polyp was found in the transverse colon. The  polyp was sessile. The polyp was removed with a                            cold snare. Resection and retrieval were complete.                           Internal hemorrhoids were found during                            retroflexion. The hemorrhoids were small.                           The exam was otherwise without abnormality. Complications:            No immediate complications. Estimated Blood Loss:     Estimated blood loss: none. Impression:               - The examined portion of the ileum was normal.                           - Patent end-to-side ileo-colonic anastomosis,                            characterized by healthy appearing mucosa.                           - One 5 mm polyp in the transverse colon, removed                            with a cold snare. Resected and retrieved.                           - Small internal hemorrhoids.                            - The examination was otherwise normal. Recommendation:           - Patient has a contact number available for                            emergencies. The signs and symptoms of potential                            delayed complications were discussed with the                            patient. Return to normal activities tomorrow.                            Written discharge instructions were provided to the                            patient.                           - Resume previous diet.                           -  Continue present medications.                           - Await pathology results.                           - Repeat colonoscopy may be recommended for                            surveillance in 5 years (based on patient                            preference, overall health and risk/benefit                            assessment at that time). The colonoscopy date will                            be determined after pathology results from today's                            exam become available for review. Gordy CHRISTELLA Starch, MD 02/23/2024 11:03:01 AM This report has been signed electronically.

## 2024-02-23 NOTE — Progress Notes (Signed)
 Called to room to assist during endoscopic procedure.  Patient ID and intended procedure confirmed with present staff. Received instructions for my participation in the procedure from the performing physician.

## 2024-02-27 ENCOUNTER — Telehealth: Payer: Self-pay | Admitting: *Deleted

## 2024-02-27 NOTE — Telephone Encounter (Signed)
" °  Follow up Call-     02/23/2024    9:55 AM  Call back number  Post procedure Call Back phone  # [phone]  Permission to leave phone message Yes     Patient questions:  Do you have a fever, pain , or abdominal swelling? No. Pain Score  0 *  Have you tolerated food without any problems? Yes.    Have you been able to return to your normal activities? Yes.    Do you have any questions about your discharge instructions: Diet   No. Medications  No. Follow up visit  No.  Do you have questions or concerns about your Care? No.  Actions: * If pain score is 4 or above: No action needed, pain <4.   "

## 2024-02-28 ENCOUNTER — Ambulatory Visit: Payer: Self-pay | Admitting: Internal Medicine

## 2024-02-28 LAB — SURGICAL PATHOLOGY

## 2024-03-28 ENCOUNTER — Other Ambulatory Visit

## 2024-03-28 ENCOUNTER — Ambulatory Visit: Admitting: Oncology
